# Patient Record
Sex: Female | Born: 1950 | Race: White | Hispanic: No | Smoking: Former smoker
Health system: Southern US, Community
[De-identification: ages and names within clinical notes are randomized; demographics above are authoritative.]

## PROBLEM LIST (undated history)

## (undated) DIAGNOSIS — K219 Gastro-esophageal reflux disease without esophagitis: Secondary | ICD-10-CM

## (undated) DIAGNOSIS — C50919 Malignant neoplasm of unspecified site of unspecified female breast: Secondary | ICD-10-CM

## (undated) DIAGNOSIS — T8859XA Other complications of anesthesia, initial encounter: Secondary | ICD-10-CM

## (undated) DIAGNOSIS — I499 Cardiac arrhythmia, unspecified: Secondary | ICD-10-CM

## (undated) DIAGNOSIS — M8580 Other specified disorders of bone density and structure, unspecified site: Secondary | ICD-10-CM

## (undated) DIAGNOSIS — I491 Atrial premature depolarization: Secondary | ICD-10-CM

## (undated) DIAGNOSIS — C801 Malignant (primary) neoplasm, unspecified: Secondary | ICD-10-CM

## (undated) DIAGNOSIS — Z923 Personal history of irradiation: Secondary | ICD-10-CM

## (undated) DIAGNOSIS — J45909 Unspecified asthma, uncomplicated: Secondary | ICD-10-CM

## (undated) DIAGNOSIS — M1990 Unspecified osteoarthritis, unspecified site: Secondary | ICD-10-CM

## (undated) DIAGNOSIS — Z8719 Personal history of other diseases of the digestive system: Secondary | ICD-10-CM

## (undated) DIAGNOSIS — T4145XA Adverse effect of unspecified anesthetic, initial encounter: Secondary | ICD-10-CM

## (undated) DIAGNOSIS — T7840XA Allergy, unspecified, initial encounter: Secondary | ICD-10-CM

## (undated) DIAGNOSIS — H269 Unspecified cataract: Secondary | ICD-10-CM

## (undated) HISTORY — PX: ABDOMINAL HYSTERECTOMY: SHX81

## (undated) HISTORY — DX: Other specified disorders of bone density and structure, unspecified site: M85.80

## (undated) HISTORY — DX: Malignant (primary) neoplasm, unspecified: C80.1

## (undated) HISTORY — DX: Allergy, unspecified, initial encounter: T78.40XA

## (undated) HISTORY — PX: CATARACT EXTRACTION: SUR2

## (undated) HISTORY — DX: Personal history of other diseases of the digestive system: Z87.19

## (undated) HISTORY — PX: BREAST LUMPECTOMY: SHX2

## (undated) HISTORY — DX: Unspecified cataract: H26.9

## (undated) HISTORY — DX: Cardiac arrhythmia, unspecified: I49.9

## (undated) HISTORY — PX: APPENDECTOMY: SHX54

## (undated) HISTORY — DX: Unspecified osteoarthritis, unspecified site: M19.90

## (undated) HISTORY — PX: BUNIONECTOMY: SHX129

## (undated) HISTORY — DX: Gastro-esophageal reflux disease without esophagitis: K21.9

---
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
## 1976-02-14 DIAGNOSIS — C801 Malignant (primary) neoplasm, unspecified: Secondary | ICD-10-CM

## 1976-02-14 HISTORY — DX: Malignant (primary) neoplasm, unspecified: C80.1

## 1986-02-13 HISTORY — PX: COLON SURGERY: SHX602

## 1997-08-20 ENCOUNTER — Ambulatory Visit (HOSPITAL_COMMUNITY): Admission: RE | Admit: 1997-08-20 | Discharge: 1997-08-20 | Payer: Self-pay | Admitting: Gastroenterology

## 1998-12-16 ENCOUNTER — Encounter (INDEPENDENT_AMBULATORY_CARE_PROVIDER_SITE_OTHER): Payer: Self-pay | Admitting: Specialist

## 1998-12-16 ENCOUNTER — Other Ambulatory Visit: Admission: RE | Admit: 1998-12-16 | Discharge: 1998-12-16 | Payer: Self-pay | Admitting: Gastroenterology

## 1999-06-16 ENCOUNTER — Encounter: Admission: RE | Admit: 1999-06-16 | Discharge: 1999-06-16 | Payer: Self-pay | Admitting: Gynecology

## 1999-06-16 ENCOUNTER — Encounter: Payer: Self-pay | Admitting: Gynecology

## 2000-05-07 ENCOUNTER — Other Ambulatory Visit: Admission: RE | Admit: 2000-05-07 | Discharge: 2000-05-07 | Payer: Self-pay | Admitting: Gynecology

## 2000-06-18 ENCOUNTER — Encounter: Admission: RE | Admit: 2000-06-18 | Discharge: 2000-06-18 | Payer: Self-pay | Admitting: Gynecology

## 2000-06-18 ENCOUNTER — Encounter: Payer: Self-pay | Admitting: Gynecology

## 2001-06-03 ENCOUNTER — Other Ambulatory Visit: Admission: RE | Admit: 2001-06-03 | Discharge: 2001-06-03 | Payer: Self-pay | Admitting: Gynecology

## 2001-06-20 ENCOUNTER — Encounter: Admission: RE | Admit: 2001-06-20 | Discharge: 2001-06-20 | Payer: Self-pay | Admitting: Gynecology

## 2001-06-20 ENCOUNTER — Encounter: Payer: Self-pay | Admitting: Gynecology

## 2002-06-10 ENCOUNTER — Other Ambulatory Visit: Admission: RE | Admit: 2002-06-10 | Discharge: 2002-06-10 | Payer: Self-pay | Admitting: Gynecology

## 2002-06-24 ENCOUNTER — Encounter: Admission: RE | Admit: 2002-06-24 | Discharge: 2002-06-24 | Payer: Self-pay | Admitting: Gynecology

## 2002-06-24 ENCOUNTER — Encounter: Payer: Self-pay | Admitting: Gynecology

## 2003-06-15 ENCOUNTER — Other Ambulatory Visit: Admission: RE | Admit: 2003-06-15 | Discharge: 2003-06-15 | Payer: Self-pay | Admitting: Gynecology

## 2003-07-02 ENCOUNTER — Encounter: Admission: RE | Admit: 2003-07-02 | Discharge: 2003-07-02 | Payer: Self-pay | Admitting: Gynecology

## 2004-06-15 ENCOUNTER — Other Ambulatory Visit: Admission: RE | Admit: 2004-06-15 | Discharge: 2004-06-15 | Payer: Self-pay | Admitting: Gynecology

## 2004-07-13 ENCOUNTER — Encounter: Admission: RE | Admit: 2004-07-13 | Discharge: 2004-07-13 | Payer: Self-pay | Admitting: Gynecology

## 2005-06-20 ENCOUNTER — Other Ambulatory Visit: Admission: RE | Admit: 2005-06-20 | Discharge: 2005-06-20 | Payer: Self-pay | Admitting: Gynecology

## 2005-07-25 ENCOUNTER — Encounter: Admission: RE | Admit: 2005-07-25 | Discharge: 2005-07-25 | Payer: Self-pay | Admitting: Gynecology

## 2006-06-26 ENCOUNTER — Other Ambulatory Visit: Admission: RE | Admit: 2006-06-26 | Discharge: 2006-06-26 | Payer: Self-pay | Admitting: Gynecology

## 2006-08-06 ENCOUNTER — Encounter: Admission: RE | Admit: 2006-08-06 | Discharge: 2006-08-06 | Payer: Self-pay | Admitting: Gynecology

## 2007-01-16 ENCOUNTER — Ambulatory Visit: Payer: Self-pay | Admitting: Internal Medicine

## 2007-02-01 ENCOUNTER — Ambulatory Visit: Payer: Self-pay | Admitting: Internal Medicine

## 2007-02-01 ENCOUNTER — Encounter: Payer: Self-pay | Admitting: Internal Medicine

## 2007-04-17 ENCOUNTER — Ambulatory Visit: Payer: Self-pay | Admitting: Internal Medicine

## 2007-04-17 LAB — CONVERTED CEMR LAB
ALT: 15 units/L (ref 0–35)
AST: 17 units/L (ref 0–37)
Albumin: 4.2 g/dL (ref 3.5–5.2)
Alkaline Phosphatase: 63 units/L (ref 39–117)
BUN: 10 mg/dL (ref 6–23)
Basophils Absolute: 0 10*3/uL (ref 0.0–0.1)
Basophils Relative: 0.4 % (ref 0.0–1.0)
Bilirubin, Direct: 0.1 mg/dL (ref 0.0–0.3)
CO2: 29 meq/L (ref 19–32)
Calcium: 9.5 mg/dL (ref 8.4–10.5)
Chloride: 103 meq/L (ref 96–112)
Creatinine, Ser: 0.8 mg/dL (ref 0.4–1.2)
Eosinophils Absolute: 0.1 10*3/uL (ref 0.0–0.6)
Eosinophils Relative: 1.8 % (ref 0.0–5.0)
GFR calc Af Amer: 96 mL/min
GFR calc non Af Amer: 79 mL/min
Glucose, Bld: 91 mg/dL (ref 70–99)
HCT: 38.5 % (ref 36.0–46.0)
Hemoglobin: 13 g/dL (ref 12.0–15.0)
Lymphocytes Relative: 26.5 % (ref 12.0–46.0)
MCHC: 33.9 g/dL (ref 30.0–36.0)
MCV: 88.8 fL (ref 78.0–100.0)
Monocytes Absolute: 0.3 10*3/uL (ref 0.2–0.7)
Monocytes Relative: 5.9 % (ref 3.0–11.0)
Neutro Abs: 3.9 10*3/uL (ref 1.4–7.7)
Neutrophils Relative %: 65.4 % (ref 43.0–77.0)
Platelets: 245 10*3/uL (ref 150–400)
Potassium: 4 meq/L (ref 3.5–5.1)
RBC: 4.33 M/uL (ref 3.87–5.11)
RDW: 12.1 % (ref 11.5–14.6)
Sodium: 140 meq/L (ref 135–145)
TSH: 1.84 microintl units/mL (ref 0.35–5.50)
Tissue Transglutaminase Ab, IgA: 0.2 units (ref <7)
Total Bilirubin: 0.5 mg/dL (ref 0.3–1.2)
Total Protein: 6.8 g/dL (ref 6.0–8.3)
WBC: 5.8 10*3/uL (ref 4.5–10.5)

## 2007-04-18 ENCOUNTER — Encounter: Payer: Self-pay | Admitting: Internal Medicine

## 2007-05-15 ENCOUNTER — Ambulatory Visit: Payer: Self-pay | Admitting: Internal Medicine

## 2007-08-07 ENCOUNTER — Encounter: Admission: RE | Admit: 2007-08-07 | Discharge: 2007-08-07 | Payer: Self-pay | Admitting: Gynecology

## 2008-08-07 ENCOUNTER — Encounter: Admission: RE | Admit: 2008-08-07 | Discharge: 2008-08-07 | Payer: Self-pay | Admitting: Gynecology

## 2009-07-26 ENCOUNTER — Encounter: Admission: RE | Admit: 2009-07-26 | Discharge: 2009-07-26 | Payer: Self-pay | Admitting: Family Medicine

## 2009-08-09 ENCOUNTER — Encounter: Admission: RE | Admit: 2009-08-09 | Discharge: 2009-08-09 | Payer: Self-pay | Admitting: Gynecology

## 2010-06-28 NOTE — Assessment & Plan Note (Signed)
[facility]                         GASTROENTEROLOGY OFFICE NOTE   Kara, Ramos                    MRN:          [id number]  DATE:05/15/2007                            DOB:          07/05/1950    HISTORY:  Kara Ramos presents today for followup.  She is a 60 year old  with a remote history of gynecologic cancer for which she underwent  radical hysterectomy in 1978.  She also has a history of endometrial  disease involving the colon, for which she has undergone segmental  resection of the sigmoid colon.  Finally, she has a history of chronic  abdominal complaints, consistent with irritable bowel syndrome.  She was  evaluated in the office April 17, 2007, for alternating bowel habits.  She was felt to have an exacerbation of irritable bowel, likely due to  an infectious agent.  We did test her stools for fat, as well as Giardia  and cryptosporidium.  These were negative.  Tissue transglutaminase  antibody was also negative.  She was empirically treated with  metronidazole and Align.  She presents today for followup.   The patient reports marked improvement in her abdominal complaints.  She  states her bowel habits have returned to their norm.  No other abdominal  issues.  She does tell me, however, that she has developed oral thrush,  which has persisted.  No odynophagia.   CURRENT MEDICATIONS INCLUDE:  Vivelle Dot, multivitamin, Citrucel,  vitamin D and Prilosec.   ALLERGIES:  She is allergic to ERYTHROMYCIN only.   PHYSICAL EXAM:  Reveals a well-appearing female, in no acute distress.  Blood pressure is 104/60, heart rate 68, weight is 147.2 pounds.  HEENT:  Oral mucosa indeed reveals thrush on the tongue, as manifested  by dense, white exudate.  ABDOMEN:  The abdomen was not re-examined.   IMPRESSION:  1. Irritable bowel syndrome.  2. Oral candidiasis, secondary to recent antibiotics.  3. History of reflux, on Prilosec.   RECOMMENDATIONS:  1. Diflucan 200 mg day #1, then 100 mg daily for five days to treat      thrush.  (She favored this over Mycelex Troches.)  2. Okay to use probiotic therapy on demand for irritable bowel.  3. Continue Prilosec for reflux.  4. Surveillance colonoscopy in five years.  5. Resume general medical care with Dr. Theresia Lo.     Kara Ramos. Marina Goodell, MD  Electronically Signed    JNP/MedQ  DD: 05/15/2007  DT: 05/15/2007  Job #: [id number]   cc:   Vikki Ports, M.D.  Gretta Cool, M.D.

## 2010-06-28 NOTE — Assessment & Plan Note (Signed)
[facility]                         GASTROENTEROLOGY OFFICE NOTE   RICARDO, KAYES                    MRN:          [id number]  DATE:04/17/2007                            DOB:          1950/12/12    REASON FOR CONSULTATION:  Alternating bowel habits.   HISTORY:  This is a 60 year old white female with a remote history of  gynecologic cancer for which she underwent radical hysterectomy in 1978.  She also had a history of endometrial disease involving the colon for  which she underwent segmental resection of the sigmoid region.  She had  not been seen in this office since October of 2000 when she was followed  by Dr. Victorino Dike.  She does have a history of adenomatous colon polyps  and a family history of colon cancer.  I saw the patient for  surveillance colonoscopy in December of 2008.  She was found to have  multiple diminutive polyps which were both hyperplastic and adenomatous.  Followup in 5 years recommended.  The patient reports to me that prior  to that procedure, around Thanksgiving time, she developed problems with  diarrhea.  Since that time, she describes intermittent problems with  left upper quadrant cramping pain that occurs before defecation.  There  is urgency and loose stools.  Problems tend to occur at night and last  several hours.  She will be okay for several weeks.  She has tried  Imodium a few times and states that this helps.  She also has a history  of indigestion and heartburn for which she has been on Prilosec.  This  has been helpful.  She does report more chronic alternating bowel  habits, consistent with irritable bowel syndrome.  Occasionally she will  notice some blood and mucous.  She also mentions the congestion in her  head after meals.  She can have constipation.  She will use an  occasional stool softener.  Her appetite and weight have been stable.  Overall, nonspecifically, she states her symptoms seem to  have improved  a little bit in time.   PAST MEDICAL HISTORY:  1. Asthma.  2. Gynecologic cancer, status post radical hysterectomy.  3. Endometriosis, status post sigmoid colon resection.  4. Reflux disease.  5. Irritable bowel.   ALLERGIES:  ERYTHROMYCIN causes nausea and itching.   CURRENT MEDICATIONS:  1. Vivelle-Dot.  2. Multivitamin.  3. Citrucel.  4. Vitamin D.  5. Prilosec.   FAMILY HISTORY:  Grandparents with colon cancer.   SOCIAL HISTORY:  The patient is married, lives with her husband, she  does not have children.  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  She quit smoking 30 years ago.  She does  use alcohol.   REVIEW OF SYSTEMS:  Per diagnostic evaluation form.   PHYSICAL EXAMINATION:  A well-appearing female in no acute distress.  She is alert and oriented.  Blood pressure is 116/62, heart rate is 78, weight is 147.2 pounds, she  is 5 feet 2-1/2 inches in height.  HEENT:  Sclerae anicteric.  Conjunctivae are pink.  Oral mucosa  is  intact, there is no adenopathy.  LUNGS:  Clear.  HEART:  Regular.  ABDOMEN:  Soft without tenderness, masses, or hernia.  Good bowel sounds  heard.  EXTREMITIES:  Without edema.   IMPRESSION:  This is a 60 year old female with a long history of  alternating bowel habits consistent with irritable bowel syndrome.  Complaints today include ongoing alternating bowel habits.  Mostly  concerned with problems with diarrhea and urgency beyond her usual  irritable bowel.  I suspect she may have had post infectious  exacerbation of her irritable bowel.  She has improved, some,  nonspecifically.  Other causes for symptoms should be sought.  It is  possible that she may have Giardia or even bacterial overgrowth.   RECOMMENDATIONS:  1. Expand the database to include routine laboratories as well as      tissue transglutaminase antibody and stool studies for fecal fat.  2. Empiric treatment with metronidazole  250 mg q.i.d. for 1 week and      probiotic Align once daily for 2 weeks.  3. Continue Prilosec for reflux.  4. Routine office followup in about 4 weeks.  In the interim she may      use Imodium p.r.n.     Wilhemina Bonito. Marina Goodell, MD  Electronically Signed    JNP/MedQ  DD: 04/19/2007  DT: 04/19/2007  Job #: [id number]   cc:   Vikki Ports, M.D.  Gretta Cool, M.D.

## 2010-07-04 ENCOUNTER — Other Ambulatory Visit: Payer: Self-pay | Admitting: Gynecology

## 2010-07-18 ENCOUNTER — Other Ambulatory Visit: Payer: Self-pay | Admitting: Gynecology

## 2010-07-18 DIAGNOSIS — Z1231 Encounter for screening mammogram for malignant neoplasm of breast: Secondary | ICD-10-CM

## 2010-08-12 ENCOUNTER — Ambulatory Visit
Admission: RE | Admit: 2010-08-12 | Discharge: 2010-08-12 | Disposition: A | Discharge status: Home / Self Care | Payer: BC Managed Care – PPO | Source: Ambulatory Visit | Attending: Gynecology | Admitting: Gynecology

## 2010-08-12 DIAGNOSIS — Z1231 Encounter for screening mammogram for malignant neoplasm of breast: Secondary | ICD-10-CM

## 2011-07-06 ENCOUNTER — Other Ambulatory Visit: Payer: Self-pay | Admitting: Gynecology

## 2011-07-17 ENCOUNTER — Other Ambulatory Visit: Payer: Self-pay | Admitting: Gynecology

## 2011-07-17 DIAGNOSIS — Z1231 Encounter for screening mammogram for malignant neoplasm of breast: Secondary | ICD-10-CM

## 2011-08-23 ENCOUNTER — Ambulatory Visit
Admission: RE | Admit: 2011-08-23 | Discharge: 2011-08-23 | Disposition: A | Discharge status: Home / Self Care | Payer: BC Managed Care – PPO | Source: Ambulatory Visit | Attending: Gynecology | Admitting: Gynecology

## 2011-08-23 DIAGNOSIS — Z1231 Encounter for screening mammogram for malignant neoplasm of breast: Secondary | ICD-10-CM

## 2011-08-24 ENCOUNTER — Other Ambulatory Visit: Payer: Self-pay | Admitting: Gynecology

## 2011-08-24 DIAGNOSIS — R928 Other abnormal and inconclusive findings on diagnostic imaging of breast: Secondary | ICD-10-CM

## 2011-08-31 ENCOUNTER — Ambulatory Visit
Admission: RE | Admit: 2011-08-31 | Discharge: 2011-08-31 | Disposition: A | Discharge status: Home / Self Care | Payer: BC Managed Care – PPO | Source: Ambulatory Visit | Attending: Gynecology | Admitting: Gynecology

## 2011-08-31 DIAGNOSIS — R928 Other abnormal and inconclusive findings on diagnostic imaging of breast: Secondary | ICD-10-CM

## 2012-01-04 ENCOUNTER — Encounter: Payer: Self-pay | Admitting: Internal Medicine

## 2012-01-10 ENCOUNTER — Encounter: Payer: Self-pay | Admitting: Internal Medicine

## 2012-01-17 ENCOUNTER — Other Ambulatory Visit: Payer: Self-pay | Admitting: Dermatology

## 2012-02-15 ENCOUNTER — Other Ambulatory Visit: Payer: Self-pay | Admitting: Dermatology

## 2012-02-16 ENCOUNTER — Ambulatory Visit (AMBULATORY_SURGERY_CENTER): Payer: BC Managed Care – PPO | Admitting: *Deleted

## 2012-02-16 VITALS — Ht 63.0 in | Wt 141.0 lb

## 2012-02-16 DIAGNOSIS — Z1211 Encounter for screening for malignant neoplasm of colon: Secondary | ICD-10-CM

## 2012-02-16 MED ORDER — PEG-KCL-NACL-NASULF-NA ASC-C 100 G PO SOLR: ORAL | Status: DC

## 2012-02-16 NOTE — Progress Notes (Signed)
No allergies to eggs or soy products  Pt states that with general anesthesia, she had two occurences where she "woke up when I wasn't supposed to."

## 2012-02-19 ENCOUNTER — Encounter: Payer: Self-pay | Admitting: Internal Medicine

## 2012-02-20 NOTE — Addendum Note (Signed)
Addended by: Maple Hudson on: 02/20/2012 12:46 PM   Modules accepted: Level of Service

## 2012-03-04 ENCOUNTER — Telehealth: Payer: Self-pay | Admitting: Internal Medicine

## 2012-03-04 NOTE — Telephone Encounter (Signed)
Pt states she saw the doctor yesterday and she has bronchitis. She is on Doxycycline and Robitussin with codeine, she does not have a fever. The doctor she saw yesterday told her she should be fine for the colon on Tuesday but she wanted to check with [place] and make sure. Dr. Marina Goodell please advise.

## 2012-03-04 NOTE — Telephone Encounter (Signed)
Prep is Tuesday, procedure Wednesday

## 2012-03-04 NOTE — Telephone Encounter (Signed)
Pt aware.

## 2012-03-04 NOTE — Telephone Encounter (Signed)
No problem. Keep appt.

## 2012-03-06 ENCOUNTER — Encounter: Payer: Self-pay | Admitting: Internal Medicine

## 2012-03-06 ENCOUNTER — Ambulatory Visit (AMBULATORY_SURGERY_CENTER): Payer: BC Managed Care – PPO | Admitting: Internal Medicine

## 2012-03-06 VITALS — BP 113/61 | HR 60 | Temp 97.3°F | Resp 13 | Ht 62.0 in | Wt 141.2 lb

## 2012-03-06 DIAGNOSIS — D126 Benign neoplasm of colon, unspecified: Secondary | ICD-10-CM

## 2012-03-06 DIAGNOSIS — Z86010 Personal history of colonic polyps: Secondary | ICD-10-CM

## 2012-03-06 DIAGNOSIS — Z1211 Encounter for screening for malignant neoplasm of colon: Secondary | ICD-10-CM

## 2012-03-06 MED ORDER — SODIUM CHLORIDE 0.9 % IV SOLN: 500.0000 mL | INTRAVENOUS | Status: DC

## 2012-03-06 NOTE — Progress Notes (Signed)
VSS. A&O. Pleased with MAC. Report to April RN, DRM

## 2012-03-06 NOTE — Progress Notes (Signed)
Patient did not experience any of the following events: a burn prior to discharge; a fall within the facility; wrong site/side/patient/procedure/implant event; or a hospital transfer or hospital admission upon discharge from the facility. (G8907) Patient did not have preoperative order for IV antibiotic SSI prophylaxis. (G8918)  

## 2012-03-06 NOTE — Patient Instructions (Addendum)
YOU HAD AN ENDOSCOPIC PROCEDURE TODAY AT THE  ENDOSCOPY CENTER: Refer to the procedure report that was given to you for any specific questions about what was found during the examination.  If the procedure report does not answer your questions, please call your gastroenterologist to clarify.  If you requested that your care partner not be given the details of your procedure findings, then the procedure report has been included in a sealed envelope for you to review at your convenience later.  YOU SHOULD EXPECT: Some feelings of bloating in the abdomen. Passage of more gas than usual.  Walking can help get rid of the air that was put into your GI tract during the procedure and reduce the bloating. If you had a lower endoscopy (such as a colonoscopy or flexible sigmoidoscopy) you may notice spotting of blood in your stool or on the toilet paper. If you underwent a bowel prep for your procedure, then you may not have a normal bowel movement for a few days.  DIET: Your first meal following the procedure should be a light meal and then it is ok to progress to your normal diet.  A half-sandwich or bowl of soup is an example of a good first meal.  Heavy or fried foods are harder to digest and may make you feel nauseous or bloated.  Likewise meals heavy in dairy and vegetables can cause extra gas to form and this can also increase the bloating.  Drink plenty of fluids but you should avoid alcoholic beverages for 24 hours.  ACTIVITY: Your care partner should take you home directly after the procedure.  You should plan to take it easy, moving slowly for the rest of the day.  You can resume normal activity the day after the procedure however you should NOT DRIVE or use heavy machinery for 24 hours (because of the sedation medicines used during the test).    SYMPTOMS TO REPORT IMMEDIATELY: A gastroenterologist can be reached at any hour.  During normal business hours, 8:30 AM to 5:00 PM Monday through Friday,  call [phone].  After hours and on weekends, please call the GI answering service at [phone] who will take a message and have the physician on call contact you.   Following lower endoscopy (colonoscopy or flexible sigmoidoscopy):  Excessive amounts of blood in the stool  Significant tenderness or worsening of abdominal pains  Swelling of the abdomen that is new, acute  Fever of 100F or higher   FOLLOW UP: If any biopsies were taken you will be contacted by phone or by letter within the next 1-3 weeks.  Call your gastroenterologist if you have not heard about the biopsies in 3 weeks.  Our staff will call the home number listed on your records the next business day following your procedure to check on you and address any questions or concerns that you may have at that time regarding the information given to you following your procedure. This is a courtesy call and so if there is no answer at the home number and we have not heard from you through the emergency physician on call, we will assume that you have returned to your regular daily activities without incident.  SIGNATURES/CONFIDENTIALITY: You and/or your care partner have signed paperwork which will be entered into your electronic medical record.  These signatures attest to the fact that that the information above on your After Visit Summary has been reviewed and is understood.  Full responsibility of the confidentiality of   this discharge information lies with you and/or your care-partner.  Polyps-handout given  Repeat colonoscopy in 5 years.  

## 2012-03-06 NOTE — Progress Notes (Signed)
Called to room to assist during endoscopic procedure.  Patient ID and intended procedure confirmed with present staff. Received instructions for my participation in the procedure from the performing physician.  

## 2012-03-06 NOTE — Op Note (Signed)
[facility] [address] [place], [address]   COLONOSCOPY PROCEDURE REPORT  PATIENT: Kara, Ramos  MR#: [id number] BIRTHDATE: November 18, 1950 , 61  yrs. old GENDER: Female ENDOSCOPIST: Roxy Cedar, MD REFERRED PI:[id number] Program Recall PROCEDURE DATE:  03/06/2012 PROCEDURE:   Colonoscopy with snare polypectomy x 4 ASA CLASS:   Class II INDICATIONS:Patient's personal history of adenomatous colon polyps. Last exam 01-2007. MEDICATIONS: MAC sedation, administered by CRNA and propofol (Diprivan) 350mg  IV  DESCRIPTION OF PROCEDURE:   After the risks benefits and alternatives of the procedure were thoroughly explained, informed consent was obtained.  A digital rectal exam revealed no abnormalities of the rectum.   The LB CF-[id number]  endoscope was introduced through the anus and advanced to the cecum, which was identified by both the appendix and ileocecal valve.ICV IMAGED BUT NOT CAPTURED. No adverse events experienced.   The quality of the prep was excellent, using MoviPrep  The instrument was then slowly withdrawn as the colon was fully examined.      COLON FINDINGS: Four polyps measuring < 6 mm in size were found at the cecum and in the ascending colon.  A polypectomy was performed with a cold snare.  The resection was complete and the polyp tissue was completely retrieved.   There was evidence of a prior colo-colonic surgical anastomosis in the sigmoid colon.   The colon mucosa was otherwise normal.  Retroflexed views revealed no abnormalities. The time to cecum=2 minutes 37 seconds.  Withdrawal time=11 minutes 19 seconds.  The scope was withdrawn and the procedure completed. COMPLICATIONS: There were no complications.  ENDOSCOPIC IMPRESSION: 1.   Four polyps measuring < 6 mm in size were found at the cecum and in the ascending colon; polypectomy was performed with a cold snare 2.   There was evidence of a prior colo-colonic surgical  anastomosis in the sigmoid colon 3.   The colon mucosa was otherwise normal  RECOMMENDATIONS: 1. Follow up colonoscopy in 5 years   eSigned:  Roxy Cedar, MD 03/06/2012 10:56 AM  cc: The Patient    ; Gretel Acre, MD [facility])   PATIENT NAME:  Kara, Ramos MR#: [id number]

## 2012-03-07 ENCOUNTER — Telehealth: Payer: Self-pay | Admitting: *Deleted

## 2012-03-07 NOTE — Telephone Encounter (Signed)
  Follow up Call-  Call back number 03/06/2012  Post procedure Call Back phone  # [phone]  Permission to leave phone message Yes     Patient questions:  Do you have a fever, pain , or abdominal swelling? no Pain Score  0 *  Have you tolerated food without any problems? yes  Have you been able to return to your normal activities? yes  Do you have any questions about your discharge instructions: Diet   no Medications  no Follow up visit  no  Do you have questions or concerns about your Care? no  Actions: * If pain score is 4 or above: No action needed, pain <4.

## 2012-03-11 ENCOUNTER — Encounter: Payer: Self-pay | Admitting: Internal Medicine

## 2012-07-24 ENCOUNTER — Other Ambulatory Visit: Payer: Self-pay

## 2012-07-24 ENCOUNTER — Other Ambulatory Visit: Payer: Self-pay | Admitting: Gynecology

## 2012-07-24 DIAGNOSIS — M8580 Other specified disorders of bone density and structure, unspecified site: Secondary | ICD-10-CM

## 2012-09-04 ENCOUNTER — Ambulatory Visit
Admission: RE | Admit: 2012-09-04 | Discharge: 2012-09-04 | Disposition: A | Discharge status: Home / Self Care | Payer: BC Managed Care – PPO | Source: Ambulatory Visit

## 2012-09-04 ENCOUNTER — Ambulatory Visit
Admission: RE | Admit: 2012-09-04 | Discharge: 2012-09-04 | Disposition: A | Discharge status: Home / Self Care | Payer: BC Managed Care – PPO | Source: Ambulatory Visit | Attending: Gynecology | Admitting: Gynecology

## 2012-09-04 DIAGNOSIS — M8580 Other specified disorders of bone density and structure, unspecified site: Secondary | ICD-10-CM

## 2012-09-06 ENCOUNTER — Other Ambulatory Visit: Payer: Self-pay | Admitting: Gynecology

## 2012-09-06 DIAGNOSIS — R928 Other abnormal and inconclusive findings on diagnostic imaging of breast: Secondary | ICD-10-CM

## 2012-09-25 ENCOUNTER — Ambulatory Visit
Admission: RE | Admit: 2012-09-25 | Discharge: 2012-09-25 | Disposition: A | Discharge status: Home / Self Care | Payer: BC Managed Care – PPO | Source: Ambulatory Visit | Attending: Gynecology | Admitting: Gynecology

## 2012-09-25 DIAGNOSIS — R928 Other abnormal and inconclusive findings on diagnostic imaging of breast: Secondary | ICD-10-CM

## 2013-08-21 ENCOUNTER — Other Ambulatory Visit: Payer: Self-pay

## 2013-08-21 DIAGNOSIS — Z1231 Encounter for screening mammogram for malignant neoplasm of breast: Secondary | ICD-10-CM

## 2013-09-05 ENCOUNTER — Encounter (INDEPENDENT_AMBULATORY_CARE_PROVIDER_SITE_OTHER): Payer: Self-pay

## 2013-09-05 ENCOUNTER — Ambulatory Visit
Admission: RE | Admit: 2013-09-05 | Discharge: 2013-09-05 | Disposition: A | Discharge status: Home / Self Care | Payer: BC Managed Care – PPO | Source: Ambulatory Visit

## 2013-09-05 DIAGNOSIS — Z1231 Encounter for screening mammogram for malignant neoplasm of breast: Secondary | ICD-10-CM

## 2014-07-31 ENCOUNTER — Other Ambulatory Visit: Payer: Self-pay

## 2014-07-31 DIAGNOSIS — Z1231 Encounter for screening mammogram for malignant neoplasm of breast: Secondary | ICD-10-CM

## 2014-08-21 ENCOUNTER — Other Ambulatory Visit: Payer: Self-pay | Admitting: Obstetrics and Gynecology

## 2014-08-21 DIAGNOSIS — R5381 Other malaise: Secondary | ICD-10-CM

## 2014-08-25 ENCOUNTER — Other Ambulatory Visit: Payer: Self-pay | Admitting: Obstetrics and Gynecology

## 2014-08-25 DIAGNOSIS — E2839 Other primary ovarian failure: Secondary | ICD-10-CM

## 2014-09-09 ENCOUNTER — Ambulatory Visit
Admission: RE | Admit: 2014-09-09 | Discharge: 2014-09-09 | Disposition: A | Discharge status: Home / Self Care | Payer: BLUE CROSS/BLUE SHIELD | Source: Ambulatory Visit | Attending: Obstetrics and Gynecology | Admitting: Obstetrics and Gynecology

## 2014-09-09 ENCOUNTER — Ambulatory Visit
Admission: RE | Admit: 2014-09-09 | Discharge: 2014-09-09 | Disposition: A | Discharge status: Home / Self Care | Payer: BLUE CROSS/BLUE SHIELD | Source: Ambulatory Visit

## 2014-09-09 DIAGNOSIS — Z1231 Encounter for screening mammogram for malignant neoplasm of breast: Secondary | ICD-10-CM

## 2014-09-09 DIAGNOSIS — E2839 Other primary ovarian failure: Secondary | ICD-10-CM

## 2015-02-09 ENCOUNTER — Other Ambulatory Visit: Payer: Self-pay | Admitting: Family Medicine

## 2015-02-09 DIAGNOSIS — R1011 Right upper quadrant pain: Secondary | ICD-10-CM

## 2015-02-10 ENCOUNTER — Ambulatory Visit
Admission: RE | Admit: 2015-02-10 | Discharge: 2015-02-10 | Disposition: A | Discharge status: Home / Self Care | Payer: BLUE CROSS/BLUE SHIELD | Source: Ambulatory Visit | Attending: Family Medicine | Admitting: Family Medicine

## 2015-02-10 DIAGNOSIS — R1011 Right upper quadrant pain: Secondary | ICD-10-CM

## 2015-08-19 ENCOUNTER — Other Ambulatory Visit: Payer: Self-pay | Admitting: Obstetrics and Gynecology

## 2015-08-19 DIAGNOSIS — Z1231 Encounter for screening mammogram for malignant neoplasm of breast: Secondary | ICD-10-CM

## 2015-09-13 ENCOUNTER — Other Ambulatory Visit: Payer: Self-pay | Admitting: Obstetrics and Gynecology

## 2015-09-13 ENCOUNTER — Ambulatory Visit
Admission: RE | Admit: 2015-09-13 | Discharge: 2015-09-13 | Disposition: A | Discharge status: Home / Self Care | Payer: BLUE CROSS/BLUE SHIELD | Source: Ambulatory Visit | Attending: Obstetrics and Gynecology | Admitting: Obstetrics and Gynecology

## 2015-09-13 DIAGNOSIS — N63 Unspecified lump in unspecified breast: Secondary | ICD-10-CM

## 2015-09-13 DIAGNOSIS — Z1231 Encounter for screening mammogram for malignant neoplasm of breast: Secondary | ICD-10-CM

## 2015-09-16 ENCOUNTER — Ambulatory Visit
Admission: RE | Admit: 2015-09-16 | Discharge: 2015-09-16 | Disposition: A | Discharge status: Home / Self Care | Payer: BLUE CROSS/BLUE SHIELD | Source: Ambulatory Visit | Attending: Obstetrics and Gynecology | Admitting: Obstetrics and Gynecology

## 2015-09-16 DIAGNOSIS — N63 Unspecified lump in unspecified breast: Secondary | ICD-10-CM

## 2016-07-20 ENCOUNTER — Ambulatory Visit (INDEPENDENT_AMBULATORY_CARE_PROVIDER_SITE_OTHER): Payer: BLUE CROSS/BLUE SHIELD | Admitting: Podiatry

## 2016-07-20 ENCOUNTER — Encounter: Payer: Self-pay | Admitting: Podiatry

## 2016-07-20 ENCOUNTER — Ambulatory Visit (INDEPENDENT_AMBULATORY_CARE_PROVIDER_SITE_OTHER): Payer: BLUE CROSS/BLUE SHIELD

## 2016-07-20 ENCOUNTER — Ambulatory Visit: Payer: BLUE CROSS/BLUE SHIELD

## 2016-07-20 VITALS — Resp 16 | Ht 62.5 in | Wt 160.0 lb

## 2016-07-20 DIAGNOSIS — M779 Enthesopathy, unspecified: Secondary | ICD-10-CM

## 2016-07-20 DIAGNOSIS — M21621 Bunionette of right foot: Secondary | ICD-10-CM | POA: Diagnosis not present

## 2016-07-20 DIAGNOSIS — M2041 Other hammer toe(s) (acquired), right foot: Secondary | ICD-10-CM | POA: Diagnosis not present

## 2016-07-20 DIAGNOSIS — M79672 Pain in left foot: Secondary | ICD-10-CM

## 2016-07-20 MED ORDER — TRIAMCINOLONE ACETONIDE 10 MG/ML IJ SUSP
10.0000 mg | Freq: Once | INTRAMUSCULAR | Status: AC
  Administered 2016-07-20: 10 mg

## 2016-07-20 NOTE — Progress Notes (Signed)
Subjective:    Patient ID: Ernestina Penna, female   DOB: 66 y.o.   MRN: [id number]   HPI patient states that she's getting a lot of pain in the outside of the right foot that's been present for several years and the second toe right is starting to lift in the air and also the left foot has had some pain in the forefoot more nondescript in nature    Review of Systems  All other systems reviewed and are negative.       Objective:  Physical Exam  Cardiovascular: Intact distal pulses.   Musculoskeletal: Normal range of motion.  Neurological: She is alert.  Skin: Skin is warm.  Nursing note and vitals reviewed.  neurovascular status intact muscle strength adequate range of motion within normal limits with prominence of the fifth metatarsal right head with redness and fluid buildup and elevation of the second digit right foot. 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 I then recommended wider shoes for the digit and did not see anything needs to be done for the left foot  X-rays indicate there is a small spur on the tailor's bunion deformity right and there is a well-healed surgical sites first metatarsal bilateral with pin in place left first metatarsal

## 2016-07-20 NOTE — Progress Notes (Signed)
   Subjective:    Patient ID: Kara Ramos, female    DOB: June 07, 1950, 66 y.o.   MRN: [id number]  HPI Chief Complaint  Patient presents with  . Foot Pain    Bilateral; Right foot-lateral side-below 5th toe; pt stated, "this has been going on for years"  . Toe Pain    Right foot; 2nd toe & 4th toe; pt stated, "2nd toe rubs with certain shoes; 4th toe is going into the 3rd toe"      Review of Systems  All other systems reviewed and are negative.      Objective:   Physical Exam        Assessment & Plan:

## 2016-10-09 ENCOUNTER — Other Ambulatory Visit: Payer: Self-pay | Admitting: Obstetrics and Gynecology

## 2016-10-09 DIAGNOSIS — N63 Unspecified lump in unspecified breast: Secondary | ICD-10-CM

## 2016-10-11 ENCOUNTER — Ambulatory Visit
Admission: RE | Admit: 2016-10-11 | Discharge: 2016-10-11 | Disposition: A | Discharge status: Home / Self Care | Payer: BLUE CROSS/BLUE SHIELD | Source: Ambulatory Visit | Attending: Obstetrics and Gynecology | Admitting: Obstetrics and Gynecology

## 2016-10-11 DIAGNOSIS — N63 Unspecified lump in unspecified breast: Secondary | ICD-10-CM

## 2016-10-13 ENCOUNTER — Encounter: Payer: Self-pay | Admitting: Podiatry

## 2016-10-13 ENCOUNTER — Ambulatory Visit (INDEPENDENT_AMBULATORY_CARE_PROVIDER_SITE_OTHER): Payer: BLUE CROSS/BLUE SHIELD | Admitting: Podiatry

## 2016-10-13 DIAGNOSIS — M779 Enthesopathy, unspecified: Secondary | ICD-10-CM

## 2016-10-13 DIAGNOSIS — M21621 Bunionette of right foot: Secondary | ICD-10-CM | POA: Diagnosis not present

## 2016-10-13 MED ORDER — TRIAMCINOLONE ACETONIDE 10 MG/ML IJ SUSP
10.0000 mg | Freq: Once | INTRAMUSCULAR | Status: AC
  Administered 2016-10-13: 10 mg

## 2016-10-13 NOTE — Progress Notes (Signed)
Subjective:    Patient ID: Kara Ramos, female   DOB: 66 y.o.   MRN: [id number]   HPI patient states my right foot seems quite a bit improved but I'm having a lot of pain in the forefoot on my left foot.    ROS      Objective:  Physical Exam neurovascular status intact with patient found have improvement around the fifth MPJ right with chronic Taylor's bunion deformity and what appears to be inflammatory capsulitis second MPJ left foot     Assessment:    2 separate problems one being Taylor's bunion right and the other being inflammatory capsulitis left     Plan:   H&P conditions reviewed and for the right I recommended wider shoes padding and for the left I went ahead today and did a forefoot block I then aspirated the joint getting out a small amount of fluid which was bloody indicated there may be in a tear of the flexor plate and I then injected quarter cc deck some some Kenalog and applied thick plantar pad and discussed someday this may require bone repositioning. Reappoint to recheck

## 2016-10-20 ENCOUNTER — Ambulatory Visit: Payer: BLUE CROSS/BLUE SHIELD | Admitting: Podiatry

## 2016-10-25 ENCOUNTER — Encounter: Payer: Self-pay | Admitting: Podiatry

## 2016-10-25 ENCOUNTER — Ambulatory Visit (INDEPENDENT_AMBULATORY_CARE_PROVIDER_SITE_OTHER): Payer: BLUE CROSS/BLUE SHIELD | Admitting: Podiatry

## 2016-10-25 DIAGNOSIS — M779 Enthesopathy, unspecified: Secondary | ICD-10-CM | POA: Diagnosis not present

## 2016-10-25 DIAGNOSIS — M21621 Bunionette of right foot: Secondary | ICD-10-CM

## 2016-10-25 NOTE — Progress Notes (Signed)
Subjective:    Patient ID: Kara Ramos, female   DOB: 66 y.o.   MRN: [id number]   HPI patient states she still having quite a bit of discomfort in her second metatarsophalangeal joint left over right foot. States the medication help but still sore    ROS      Objective:  Physical Exam neurovascular status intact with inflammatory changes around the second MPJ left over right with chronic inflammation of the joint surface     Assessment:    Capsulitis second MPJ left over right     Plan:   Reviewed shoe gear modifications and that injections can be done on occasional basis. At this time I did make appointment with pad or if list for orthotic scanning with offloading of the second MPJ bilateral and thick material to try to reduce the pressure against the MPJs

## 2016-10-26 ENCOUNTER — Ambulatory Visit (INDEPENDENT_AMBULATORY_CARE_PROVIDER_SITE_OTHER): Payer: BLUE CROSS/BLUE SHIELD | Admitting: Orthotics

## 2016-10-26 DIAGNOSIS — M779 Enthesopathy, unspecified: Secondary | ICD-10-CM | POA: Diagnosis not present

## 2016-10-26 DIAGNOSIS — M21621 Bunionette of right foot: Secondary | ICD-10-CM

## 2016-10-26 NOTE — Progress Notes (Signed)
Patient presents today for casting/eval for CMFO per Dr Paulla Dolly.  Patient has hx of discomfort/pain 2nd MPJ bilateral.   Goal for orthotics are to offload 2nd MPJ and added forefoot cushion...  Plan on Richy to [facility]

## 2016-11-16 ENCOUNTER — Ambulatory Visit: Payer: BLUE CROSS/BLUE SHIELD | Admitting: Orthotics

## 2016-11-16 DIAGNOSIS — M2041 Other hammer toe(s) (acquired), right foot: Secondary | ICD-10-CM

## 2016-11-16 DIAGNOSIS — M779 Enthesopathy, unspecified: Secondary | ICD-10-CM

## 2016-11-16 NOTE — Progress Notes (Signed)
Patient came in today to pick up custom made foot orthotics.  The goals were accomplished and the patient reported no dissatisfaction with said orthotics.  Patient was advised of breakin period and how to report any issues. 

## 2017-01-03 ENCOUNTER — Ambulatory Visit: Payer: BLUE CROSS/BLUE SHIELD | Admitting: Podiatry

## 2017-01-03 ENCOUNTER — Ambulatory Visit (INDEPENDENT_AMBULATORY_CARE_PROVIDER_SITE_OTHER): Payer: BLUE CROSS/BLUE SHIELD

## 2017-01-03 ENCOUNTER — Encounter: Payer: Self-pay | Admitting: Podiatry

## 2017-01-03 DIAGNOSIS — M2010 Hallux valgus (acquired), unspecified foot: Secondary | ICD-10-CM

## 2017-01-03 DIAGNOSIS — M2041 Other hammer toe(s) (acquired), right foot: Secondary | ICD-10-CM | POA: Diagnosis not present

## 2017-01-03 DIAGNOSIS — M779 Enthesopathy, unspecified: Secondary | ICD-10-CM | POA: Diagnosis not present

## 2017-01-03 MED ORDER — TRIAMCINOLONE ACETONIDE 10 MG/ML IJ SUSP
10.0000 mg | Freq: Once | INTRAMUSCULAR | Status: AC
  Administered 2017-01-03: 10 mg

## 2017-01-03 NOTE — Progress Notes (Signed)
Subjective:    Patient ID: Kara Ramos, female   DOB: 66 y.o.   MRN: [id number]   HPI patient presents stating I have to get my right foot fixed and it's been very sore and making it hard for me to walk. Patient points to the outside of the right foot and the second joint and the second toe    ROS      Objective:  Physical Exam neurovascular status intact negative Homans sign was noted with patient's right second digit being elevated discomfort in the right second MPJ and prominence with pain around the fifth metatarsal head right with redness noted upon palpation     Assessment:   Inflammatory capsulitis Taylor's bunion deformity and hammertoe deformity second right      Plan:     Reviewed condition and x-rays with patient. At this point I have recommended correction and discussed digital fusion along with shortening osteotomy and fifth metatarsal osteotomy. I went over this with patient at great length and discussed procedures and what would be required and patient wants surgery understanding risk. She understands that recovery can take 6 months and there is no long-term guarantees and also the second toe may not purchase the ground. Patient signed consent form after extensive review and is scheduled for outpatient surgery and air fracture walker was dispensed with instructions on usage. Patient is encouraged to call with any questions that may come up

## 2017-01-03 NOTE — Patient Instructions (Addendum)
Bunion A bunion is a bump on the base of the big toe that forms when the bones of the big toe joint move out of position. Bunions may be small at first, but they often get larger over time. The can make walking painful. What are the causes? A bunion may be caused by:  Wearing narrow or pointed shoes that force the big toe to press against the other toes.  Abnormal foot development that causes the foot to roll inward (pronate).  Changes in the foot that are caused by certain diseases, such as rheumatoid arthritis and polio.  A foot injury.  What increases the risk? The following factors may make you more likely to develop this condition:  Wearing shoes that squeeze the toes together.  Having certain diseases, such as: ? Rheumatoid arthritis. ? Polio. ? Cerebral palsy.  Having family members who have bunions.  Being born with a foot deformity, such as flat feet or low arches.  Doing activities that put a lot of pressure on the feet, such as ballet dancing.  What are the signs or symptoms? The main symptom of a bunion is a noticeable bump on the big toe. Other symptoms may include:  Pain.  Swelling around the big toe.  Redness and inflammation.  Thick or hardened skin on the big toe or between the toes.  Stiffness or loss of motion in the big toe.  Trouble with walking.  How is this diagnosed? A bunion may be diagnosed based on your symptoms, medical history, and activities. You may have tests, such as:  X-rays. These allow your health care provider to check the position of the bones in your foot and look for damage to your joint. They also help your health care provider to determine the severity of your bunion and the best way to treat it.  Joint aspiration. In this test, a sample of fluid is removed from the toe joint. This test, which may be done if you are in a lot of pain, helps to rule out diseases that cause painful swelling of the joints, such as  arthritis.  How is this treated? There is no cure for a bunion, but treatment can help to prevent a bunion from getting worse. Treatment depends on the severity of your symptoms. Your health care provider may recommend:  Wearing shoes that have a wide toe box.  Using bunion pads to cushion the affected area.  Taping your toes together to keep them in a normal position.  Placing a device inside your shoe (orthotics) to help reduce pressure on your toe joint.  Taking medicine to ease pain, inflammation, and swelling.  Applying heat or ice to the affected area.  Doing stretching exercises.  Surgery to remove scar tissue and move the toes back into their normal position. This treatment is rare.  Follow these instructions at home:  Support your toe joint with proper footwear, shoe padding, or taping as told by your health care provider.  Take over-the-counter and prescription medicines only as told by your health care provider.  If directed, apply ice to the injured area: ? Put ice in a plastic bag. ? Place a towel between your skin and the bag. ? Leave the ice on for 20 minutes, 2-3 times per day.  If directed, apply heat to the affected area before you exercise. Use the heat source that your health care provider recommends, such as a moist heat pack or a heating pad. ? Place a towel between your   skin and the heat source. ? Leave the heat on for 20-30 minutes. ? Remove the heat if your skin turns bright red. This is especially important if you are unable to feel pain, heat, or cold. You may have a greater risk of getting burned.  Do exercises as told by your health care provider.  Keep all follow-up visits as told by your health care provider. Contact a health care provider if:  Your symptoms get worse.  Your symptoms do not improve in 2 weeks. Get help right away if:  You have severe pain and trouble with walking. This information is not intended to replace advice given  to you by your health care provider. Make sure you discuss any questions you have with your health care provider. Document Released: 01/30/2005 Document Revised: 07/08/2015 Document Reviewed: 08/30/2014 Elsevier Interactive Patient Education  2018 [employer]  Pre-Operative Instructions  Congratulations, you have decided to take an important step towards improving your quality of life.  You can be assured that the doctors and staff at [facility] will be with you every step of the way.  Here are some important things you should know:  1. Plan to be at the surgery center/hospital at least 1 (one) hour prior to your scheduled time, unless otherwise directed by the surgical center/hospital staff.  You must have a responsible adult accompany you, remain during the surgery and drive you home.  Make sure you have directions to the surgical center/hospital to ensure you arrive on time. 2. If you are having surgery at [facility] or [facility], you will need a copy of your medical history and physical form from your family physician within one month prior to the date of surgery. We will give you a form for your primary physician to complete.  3. We make every effort to accommodate the date you request for surgery.  However, there are times where surgery dates or times have to be moved.  We will contact you as soon as possible if a change in schedule is required.   4. No aspirin/ibuprofen for one week before surgery.  If you are on aspirin, any non-steroidal anti-inflammatory medications (Mobic, Aleve, Ibuprofen) should not be taken seven (7) days prior to your surgery.  You make take Tylenol for pain prior to surgery.  5. Medications - If you are taking daily heart and blood pressure medications, seizure, reflux, allergy, asthma, anxiety, pain or diabetes medications, make sure you notify the surgery center/hospital before the day of surgery so they can tell you which medications you should  take or avoid the day of surgery. 6. No food or drink after midnight the night before surgery unless directed otherwise by surgical center/hospital staff. 7. No alcoholic beverages 24-hours prior to surgery.  No smoking 24-hours prior or 24-hours after surgery. 8. Wear loose pants or shorts. They should be loose enough to fit over bandages, boots, and casts. 9. Don't wear slip-on shoes. Sneakers are preferred. 10. Bring your boot with you to the surgery center/hospital.  Also bring crutches or a walker if your physician has prescribed it for you.  If you do not have this equipment, it will be provided for you after surgery. 11. If you have not been contacted by the surgery center/hospital by the day before your surgery, call to confirm the date and time of your surgery. 12. Leave-time from work may vary depending on the type of surgery you have.  Appropriate arrangements should be made prior   to surgery with your employer. 13. Prescriptions will be provided immediately following surgery by your doctor.  Fill these as soon as possible after surgery and take the medication as directed. Pain medications will not be refilled on weekends and must be approved by the doctor. 14. Remove nail polish on the operative foot and avoid getting pedicures prior to surgery. 15. Wash the night before surgery.  The night before surgery wash the foot and leg well with water and the antibacterial soap provided. Be sure to pay special attention to beneath the toenails and in between the toes.  Wash for at least three (3) minutes. Rinse thoroughly with water and dry well with a towel.  Perform this wash unless told not to do so by your physician.  Enclosed: 1 Ice pack (please put in freezer the night before surgery)   1 Hibiclens skin cleaner   Pre-op instructions  If you have any questions regarding the instructions, please do not hesitate to call our office.  [place]: [address], [place], [place] [address] --  [phone]  [place]: [address] [place], [place] [address] -- [phone]  [place]: [address]  [place], [place] [address] -- [phone]  [place]: [address], [address], [place], [place] [address] -- [phone]  Website: [email or web address]  

## 2017-01-15 ENCOUNTER — Telehealth: Payer: Self-pay | Admitting: Podiatry

## 2017-01-15 ENCOUNTER — Telehealth: Payer: Self-pay

## 2017-01-15 NOTE — Telephone Encounter (Signed)
I'm scheduled to have a tailors bunionectomy and a pin put into another toe on 27 February 2017. This will be on my driving foot so I was wondering how long I will not be able to drive? I just want to know how long to make arrangements for other people to drive me. I also have some other questions. You can call me back at [phone] and if I don't answer you can leave a message as that is my cell phone. Thank you.

## 2017-01-15 NOTE — Telephone Encounter (Signed)
Spoke with patient informing her of normal recovery times and drive times for her particular surgery.

## 2017-01-26 NOTE — Telephone Encounter (Signed)
I am returning your call.  "I called about a week ago and someone has already returned my call."

## 2017-02-20 ENCOUNTER — Telehealth: Payer: Self-pay | Admitting: *Deleted

## 2017-02-20 NOTE — Telephone Encounter (Signed)
"  I'm scheduled to have some foot surgery next week on January 15.  I have an upper respiratory infection.  I'm going to need to postpone.  If you would call me, I'm going to have to reschedule to a later date.  Let me know if I need to call the surgical center or do you folk take care of that?"

## 2017-02-21 NOTE — Telephone Encounter (Signed)
I'm returning your call.  How can I help you?  "I have an upper respiratory infection.  So, I'm not going to be able to have my surgery next Tuesday.  I need to reschedule my surgery."  He can do it on January 29.  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  "That date will be fine, schedule it for then."  I left Caren Griffins, scheduler at [facility], a message to reschedule this patient's surgery from 02/27/2017 to 03/27/2017.

## 2017-03-16 HISTORY — PX: CORRECTION HAMMER TOE: SUR317

## 2017-03-27 ENCOUNTER — Encounter: Payer: Self-pay | Admitting: Podiatry

## 2017-03-27 DIAGNOSIS — M21541 Acquired clubfoot, right foot: Secondary | ICD-10-CM | POA: Diagnosis not present

## 2017-03-27 DIAGNOSIS — M2040 Other hammer toe(s) (acquired), unspecified foot: Secondary | ICD-10-CM | POA: Diagnosis not present

## 2017-03-29 ENCOUNTER — Telehealth: Payer: Self-pay | Admitting: *Deleted

## 2017-03-29 NOTE — Telephone Encounter (Signed)
Called and spoke with the patient and the patient stated that she was doing pretty good and had no real issues and was elevating and no fever,chills,nasuea and the pain medicine has made her itch some and I stated that if she wanted to stop taken the pain medicine and could use benadryl and call the office if need be and patient did state that she took the ace wrap off and there was not much swelling and I stated to call the office if any concerns or questions. Kara Ramos

## 2017-04-04 ENCOUNTER — Encounter: Payer: Self-pay | Admitting: Podiatry

## 2017-04-04 ENCOUNTER — Ambulatory Visit (INDEPENDENT_AMBULATORY_CARE_PROVIDER_SITE_OTHER): Payer: BLUE CROSS/BLUE SHIELD

## 2017-04-04 ENCOUNTER — Ambulatory Visit (INDEPENDENT_AMBULATORY_CARE_PROVIDER_SITE_OTHER): Payer: BLUE CROSS/BLUE SHIELD | Admitting: Podiatry

## 2017-04-04 VITALS — BP 132/82 | HR 65 | Temp 98.4°F

## 2017-04-04 DIAGNOSIS — E2839 Other primary ovarian failure: Secondary | ICD-10-CM | POA: Insufficient documentation

## 2017-04-04 DIAGNOSIS — M2010 Hallux valgus (acquired), unspecified foot: Secondary | ICD-10-CM | POA: Diagnosis not present

## 2017-04-04 DIAGNOSIS — M21621 Bunionette of right foot: Secondary | ICD-10-CM

## 2017-04-04 DIAGNOSIS — M8580 Other specified disorders of bone density and structure, unspecified site: Secondary | ICD-10-CM | POA: Insufficient documentation

## 2017-04-04 DIAGNOSIS — N951 Menopausal and female climacteric states: Secondary | ICD-10-CM | POA: Insufficient documentation

## 2017-04-04 DIAGNOSIS — N898 Other specified noninflammatory disorders of vagina: Secondary | ICD-10-CM | POA: Insufficient documentation

## 2017-04-04 DIAGNOSIS — N959 Unspecified menopausal and perimenopausal disorder: Secondary | ICD-10-CM | POA: Insufficient documentation

## 2017-04-04 NOTE — Progress Notes (Signed)
Subjective:   Patient ID: Kara Ramos, female   DOB: 67 y.o.   MRN: [id number]   HPI Patient states doing real well with the right foot with minimal discomfort and able to walk without pain.   ROS      Objective:  Physical Exam  Neurovascular status intact with patient's right foot healing well with wound edges well coapted hallux in rectus position digit second in good alignment with pin in place     Assessment:  Doing well post forefoot reconstruction right     Plan:  Advised patient on continued compression immobilization and elevation.  Reappoint for [place] to recheck again in sterile dressing reapplied today and will have stitches removed in 2 weeks  X-rays indicate osteotomy is healing well.  In place second toe

## 2017-04-11 ENCOUNTER — Encounter: Payer: Self-pay | Admitting: Internal Medicine

## 2017-04-13 ENCOUNTER — Telehealth: Payer: Self-pay | Admitting: Podiatry

## 2017-04-13 NOTE — Telephone Encounter (Signed)
I informed pt that it was not unusual for pins to back out of the toes, and often increased activity helped to work the pin out. I told pt to cover the end of the pin, remain in the surgical shoe, and decrease activity. I told pt that if the pin did come out cover with a antibiotic bandaid and call our office even over the weekend. Pt states understanding.

## 2017-04-13 NOTE — Telephone Encounter (Signed)
I had surgery with Dr. Paulla Dolly on 12 February. There is a pin in my 2nd right toe that looks like it is pushing itself out and I wanted to know if that is normal. I didn't know if I needed to be seen? I have an appointment scheduled on 20 March to have the pin removed but by appearance it looks like it is out quite a bit more than what it was to begin with. My number is [phone]. Thank you.

## 2017-04-19 ENCOUNTER — Ambulatory Visit (INDEPENDENT_AMBULATORY_CARE_PROVIDER_SITE_OTHER): Payer: BLUE CROSS/BLUE SHIELD

## 2017-04-19 ENCOUNTER — Ambulatory Visit (INDEPENDENT_AMBULATORY_CARE_PROVIDER_SITE_OTHER): Payer: BLUE CROSS/BLUE SHIELD | Admitting: Podiatry

## 2017-04-19 ENCOUNTER — Telehealth: Payer: Self-pay | Admitting: Podiatry

## 2017-04-19 DIAGNOSIS — M21621 Bunionette of right foot: Secondary | ICD-10-CM

## 2017-04-19 DIAGNOSIS — M2041 Other hammer toe(s) (acquired), right foot: Secondary | ICD-10-CM | POA: Diagnosis not present

## 2017-04-19 NOTE — Telephone Encounter (Signed)
I called last Friday because the pin in my toe that I had hammertoe surgery on has been backing out. It had slowed up some, but it is out about 3 quarters of an inch from the end of the toe to the bottom of the pin. I'm just very concerned that its going to get caught on something. My number is [phone]. Just looking for suggestions or do you think Dr. Paulla Dolly would like to go ahead and pull it out since I'm not scheduled to come back in until 20 March for it to be pulled out.

## 2017-04-19 NOTE — Telephone Encounter (Signed)
I informed pt, she would benefit from an appt with Gretta Arab, RN today and transferred to schedulers.

## 2017-04-21 NOTE — Progress Notes (Signed)
Subjective: Kara Ramos is a 67 y.o. is seen today in office s/p hammertoe repair as well as tailor's bunion preformed on 03/27/2017 with Dr. Paulla Dolly.  He presents today as an add-on appointment due to the wire to the second toe coming out more and she also has significant pain pointing submetatarsal 5.  She denies any recent injury or trauma.  She denies any swelling of redness increase.  Denies any open sores.  Denies any systemic complaints such as fevers, chills, nausea, vomiting. No calf pain, chest pain, shortness of breath.   Objective: General: No acute distress, AAOx3  DP/PT pulses palpable 2/4, CRT < 3 sec to all digits.  Protective sensation intact. Motor function intact.  Right foot: Incision is well coapted without any evidence of dehiscence incisions are all well-healed. There is no surrounding erythema, ascending cellulitis, fluctuance, crepitus, malodor, drainage/purulence. There is mild edema around the surgical site.  There is tenderness to the fifth metatarsal head plantarly.  The wire to the second toe has come out some.  There is no drainage or pus coming from the K wire site and there is no clinical signs of infection noted to the foot. No other areas of tenderness to bilateral lower extremities.  No other open lesions or pre-ulcerative lesions.  No pain with calf compression, swelling, warmth, erythema.   Assessment and Plan:  Status post RIGHT foot surgery presents today for increased pain and wire coming out.    -Treatment options discussed including all alternatives, risks, and complications -X-rays were obtained and reviewed.  K wire still across the PIPJ.  History of multiple metatarsal appears to so therefore left intact.  We did bandage it so that with the K wire but not get caught on anything but I did not want to push it back in. -Discussed with her screw removal once the bone heals more. She was alright with having this done in the near future should her pain  continue.  -We did offlaod the 5th metatarsal today to help decrease the pressure to the area.  -Ice/elevation -Pain medication as needed. -Monitor for any clinical signs or symptoms of infection and DVT/PE and directed to call the office immediately should any occur or go to the ER. -Follow-up in 1 week with Dr. Paulla Dolly or sooner if any problems arise. In the meantime, encouraged to call the office with any questions, concerns, change in symptoms.   Celesta Gentile, DPM

## 2017-04-23 ENCOUNTER — Encounter: Payer: Self-pay | Admitting: Internal Medicine

## 2017-04-26 ENCOUNTER — Encounter: Payer: Self-pay | Admitting: Podiatry

## 2017-04-26 ENCOUNTER — Ambulatory Visit (INDEPENDENT_AMBULATORY_CARE_PROVIDER_SITE_OTHER): Payer: BLUE CROSS/BLUE SHIELD | Admitting: Podiatry

## 2017-04-26 ENCOUNTER — Ambulatory Visit (INDEPENDENT_AMBULATORY_CARE_PROVIDER_SITE_OTHER): Payer: BLUE CROSS/BLUE SHIELD

## 2017-04-26 VITALS — BP 92/55 | HR 67 | Resp 16

## 2017-04-26 DIAGNOSIS — M21621 Bunionette of right foot: Secondary | ICD-10-CM

## 2017-04-26 NOTE — Progress Notes (Signed)
Subjective:   Patient ID: Kara Ramos, female   DOB: 67 y.o.   MRN: [id number]   HPI Patient presents stating she is doing well with minimal discomfort but states she wants the pin removed from the second digit and the pain in the outside the foot is manageable   ROS      Objective:  Physical Exam  Neurovascular status intact negative Homans sign noted with pin that has moved in a distal direction right second toe and is ready to be removed with excellent alignment second digit with minimal discomfort currently underneath the fifth metatarsal head right     Assessment:  Doing well post surgery second and fifth digits right metatarsal second right     Plan:  H&P x-rays reviewed pin removed second digit with sterile dressing applied.  Given instructions on gradual increase in activity levels continued elevation compression immobilization and explained possible screw removal fifth metatarsal and I applied padding to take pressure off the area but I am not convinced the screw is causing the problem and we will just evaluated over the next 4-6 weeks.  Reappoint at that time or earlier if needed  X-ray indicates that the screws appear to be in place and its possible the fifth metatarsal screw might be slightly long and there is some slight secondary bone healing fifth metatarsal right but it should heal uneventfully

## 2017-05-02 ENCOUNTER — Other Ambulatory Visit: Payer: BLUE CROSS/BLUE SHIELD | Admitting: Podiatry

## 2017-05-14 HISTORY — PX: COLONOSCOPY: SHX174

## 2017-05-31 ENCOUNTER — Encounter: Payer: Self-pay | Admitting: Podiatry

## 2017-05-31 ENCOUNTER — Ambulatory Visit (INDEPENDENT_AMBULATORY_CARE_PROVIDER_SITE_OTHER): Payer: BLUE CROSS/BLUE SHIELD

## 2017-05-31 ENCOUNTER — Ambulatory Visit (INDEPENDENT_AMBULATORY_CARE_PROVIDER_SITE_OTHER): Payer: BLUE CROSS/BLUE SHIELD | Admitting: Podiatry

## 2017-05-31 DIAGNOSIS — M2041 Other hammer toe(s) (acquired), right foot: Secondary | ICD-10-CM | POA: Diagnosis not present

## 2017-05-31 NOTE — Progress Notes (Signed)
Subjective:   Patient ID: Kara Ramos, female   DOB: 67 y.o.   MRN: [id number]   HPI Patient states she feels like something is loose in her foot but overall is extremely happy with the results after surgery 2 months ago   ROS      Objective:  Physical Exam  Neurovascular status intact with patient's right foot doing well with wound edges well coapted in good range of motion with minimal edema noted for this.  Postop with mild discomfort around the fourth metatarsal     Assessment:  Doing well overall with inflammatory changes with no indications of significant pathology with good healing occurring     Plan:  H&P x-rays reviewed and at this time I recommended anti-inflammatories supportive shoe and that this should gradually go away.  Reappoint as symptoms indicate  X-rays indicate the osteotomies have healed well good alignment good position of the second toe

## 2017-06-19 ENCOUNTER — Ambulatory Visit (AMBULATORY_SURGERY_CENTER): Payer: Self-pay

## 2017-06-19 VITALS — Ht 62.0 in | Wt 175.4 lb

## 2017-06-19 DIAGNOSIS — Z86010 Personal history of colonic polyps: Secondary | ICD-10-CM

## 2017-06-19 MED ORDER — NA SULFATE-K SULFATE-MG SULF 17.5-3.13-1.6 GM/177ML PO SOLN: 1.0000 | Freq: Once | ORAL | 0 refills | Status: AC

## 2017-06-19 NOTE — Progress Notes (Signed)
Per pt, no allergies to soy or egg products.Pt not taking any weight loss meds or using  O2 at home.  Pt refused emmi video. 

## 2017-06-25 ENCOUNTER — Encounter: Payer: Self-pay | Admitting: Internal Medicine

## 2017-07-03 ENCOUNTER — Encounter: Payer: Self-pay | Admitting: Internal Medicine

## 2017-07-03 ENCOUNTER — Ambulatory Visit (AMBULATORY_SURGERY_CENTER): Payer: BLUE CROSS/BLUE SHIELD | Admitting: Internal Medicine

## 2017-07-03 ENCOUNTER — Other Ambulatory Visit: Payer: Self-pay

## 2017-07-03 VITALS — BP 130/76 | HR 73 | Temp 98.6°F | Resp 14 | Ht 62.0 in | Wt 175.0 lb

## 2017-07-03 DIAGNOSIS — Z86010 Personal history of colonic polyps: Secondary | ICD-10-CM | POA: Diagnosis not present

## 2017-07-03 DIAGNOSIS — D122 Benign neoplasm of ascending colon: Secondary | ICD-10-CM

## 2017-07-03 MED ORDER — SODIUM CHLORIDE 0.9 % IV SOLN: 500.0000 mL | Freq: Once | INTRAVENOUS | Status: DC

## 2017-07-03 NOTE — Progress Notes (Signed)
Report to PACU, RN, vss, BBS= Clear.  

## 2017-07-03 NOTE — Progress Notes (Signed)
Called to room to assist during endoscopic procedure.  Patient ID and intended procedure confirmed with present staff. Received instructions for my participation in the procedure from the performing physician.  

## 2017-07-03 NOTE — Patient Instructions (Signed)
YOU HAD AN ENDOSCOPIC PROCEDURE TODAY AT THE [facility]:   Refer to the procedure report that was given to you for any specific questions about what was found during the examination.  If the procedure report does not answer your questions, please call your gastroenterologist to clarify.  If you requested that your care partner not be given the details of your procedure findings, then the procedure report has been included in a sealed envelope for you to review at your convenience later.  YOU SHOULD EXPECT: Some feelings of bloating in the abdomen. Passage of more gas than usual.  Walking can help get rid of the air that was put into your GI tract during the procedure and reduce the bloating. If you had a lower endoscopy (such as a colonoscopy or flexible sigmoidoscopy) you may notice spotting of blood in your stool or on the toilet paper. If you underwent a bowel prep for your procedure, you may not have a normal bowel movement for a few days.  Please Note:  You might notice some irritation and congestion in your nose or some drainage.  This is from the oxygen used during your procedure.  There is no need for concern and it should clear up in a day or so.  SYMPTOMS TO REPORT IMMEDIATELY:   Following lower endoscopy (colonoscopy or flexible sigmoidoscopy):  Excessive amounts of blood in the stool  Significant tenderness or worsening of abdominal pains  Swelling of the abdomen that is new, acute  Fever of 100F or higher   For urgent or emergent issues, a gastroenterologist can be reached at any hour by calling [phone].   DIET:  We do recommend a small meal at first, but then you may proceed to your regular diet.  Drink plenty of fluids but you should avoid alcoholic beverages for 24 hours.  ACTIVITY:  You should plan to take it easy for the rest of today and you should NOT DRIVE or use heavy machinery until tomorrow (because of the sedation medicines used during the test).     FOLLOW UP: Our staff will call the number listed on your records the next business day following your procedure to check on you and address any questions or concerns that you may have regarding the information given to you following your procedure. If we do not reach you, we will leave a message.  However, if you are feeling well and you are not experiencing any problems, there is no need to return our call.  We will assume that you have returned to your regular daily activities without incident.  If any biopsies were taken you will be contacted by phone or by letter within the next 1-3 weeks.  Please call us at [phone] if you have not heard about the biopsies in 3 weeks.    SIGNATURES/CONFIDENTIALITY: You and/or your care partner have signed paperwork which will be entered into your electronic medical record.  These signatures attest to the fact that that the information above on your After Visit Summary has been reviewed and is understood.  Full responsibility of the confidentiality of this discharge information lies with you and/or your care-partner.  Thank you for letting us take care of your healthcare needs today. 

## 2017-07-03 NOTE — Op Note (Signed)
[place] Patient Name: Serinity Ware Procedure Date: 07/03/2017 11:04 AM MRN: [id number] Endoscopist: Docia Chuck. Henrene Pastor , MD Age: 67 Referring MD:  Date of Birth: 03-May-1950 Gender: Female Account #: [id number] Procedure:                Colonoscopy, with cold snare polypectomy x 1 Indications:              High risk colon cancer surveillance: Personal                            history of multiple (3 or more) adenomas. Multiple                            prior colonoscopies. Last examination 2014. History                            of distal sigmoid resection for endometriosis,                            remotely Medicines:                Monitored Anesthesia Care Procedure:                Pre-Anesthesia Assessment:                           - Prior to the procedure, a History and Physical                            was performed, and patient medications and                            allergies were reviewed. The patient's tolerance of                            previous anesthesia was also reviewed. The risks                            and benefits of the procedure and the sedation                            options and risks were discussed with the patient.                            All questions were answered, and informed consent                            was obtained. Prior Anticoagulants: The patient has                            taken no previous anticoagulant or antiplatelet                            agents. ASA Grade Assessment: II - A patient with  mild systemic disease. After reviewing the risks                            and benefits, the patient was deemed in                            satisfactory condition to undergo the procedure.                           After obtaining informed consent, the colonoscope                            was passed under direct vision. Throughout the                            procedure, the patient's  blood pressure, pulse, and                            oxygen saturations were monitored continuously. The                            Model CF-HQ190L [id number]) scope was introduced                            through the anus and advanced to the the cecum,                            identified by appendiceal orifice and ileocecal                            valve. The ileocecal valve, appendiceal orifice,                            and rectum were photographed. The quality of the                            bowel preparation was excellent. The colonoscopy                            was performed without difficulty. The patient                            tolerated the procedure well. The bowel preparation                            used was SUPREP. Scope In: 11:14:25 AM Scope Out: 11:24:27 AM Scope Withdrawal Time: 0 hours 7 minutes 38 seconds  Total Procedure Duration: 0 hours 10 minutes 2 seconds  Findings:                 A 4 mm polyp was found in the ascending colon. The                            polyp was removed with a cold snare. Resection and  retrieval were complete.                           A single medium-mouthed diverticulum was found in                            the sigmoid colon.                           The exam was otherwise without abnormality on                            direct and retroflexion views. Complications:            No immediate complications. Estimated blood loss:                            None. Estimated Blood Loss:     Estimated blood loss: none. Impression:               - One 4 mm polyp in the ascending colon, removed                            with a cold snare. Resected and retrieved.                           - Diverticulosis in the sigmoid colon.                           - The examination was otherwise normal on direct                            and retroflexion views. Recommendation:           - Repeat colonoscopy in  5 years for surveillance.                           - Patient has a contact number available for                            emergencies. The signs and symptoms of potential                            delayed complications were discussed with the                            patient. Return to normal activities tomorrow.                            Written discharge instructions were provided to the                            patient.                           - Resume previous diet.                           -  Continue present medications.                           - Await pathology results. Docia Chuck. Henrene Pastor, MD 07/03/2017 11:28:28 AM This report has been signed electronically.

## 2017-07-04 ENCOUNTER — Telehealth: Payer: Self-pay | Admitting: *Deleted

## 2017-07-04 NOTE — Telephone Encounter (Signed)
  Follow up Call-  Call back number 07/03/2017  Post procedure Call Back phone  # [phone]  Permission to leave phone message Yes  Some recent data might be hidden     Patient questions:  Do you have a fever, pain , or abdominal swelling? No. Pain Score  0 *  Have you tolerated food without any problems? Yes.    Have you been able to return to your normal activities? Yes.    Do you have any questions about your discharge instructions: Diet   No. Medications  No. Follow up visit  No.  Do you have questions or concerns about your Care? No.  Actions: * If pain score is 4 or above: No action needed, pain <4.

## 2017-07-05 ENCOUNTER — Encounter: Payer: Self-pay | Admitting: Internal Medicine

## 2017-07-12 ENCOUNTER — Ambulatory Visit (INDEPENDENT_AMBULATORY_CARE_PROVIDER_SITE_OTHER): Payer: BLUE CROSS/BLUE SHIELD | Admitting: Podiatry

## 2017-07-12 ENCOUNTER — Ambulatory Visit (INDEPENDENT_AMBULATORY_CARE_PROVIDER_SITE_OTHER): Payer: BLUE CROSS/BLUE SHIELD

## 2017-07-12 ENCOUNTER — Encounter: Payer: Self-pay | Admitting: Podiatry

## 2017-07-12 DIAGNOSIS — M779 Enthesopathy, unspecified: Secondary | ICD-10-CM | POA: Diagnosis not present

## 2017-07-12 DIAGNOSIS — M2041 Other hammer toe(s) (acquired), right foot: Secondary | ICD-10-CM | POA: Diagnosis not present

## 2017-07-12 NOTE — Progress Notes (Signed)
Subjective:   Patient ID: Kara Ramos, female   DOB: 67 y.o.   MRN: [id number]   HPI Patient states overall doing well but I am feeling a lot of prominence against my metatarsals and they get sore when I walk   ROS      Objective:  Physical Exam  Neurovascular status intact with significant diminishment of discomfort of the lesser MPJs right where we did the surgery but there is prominence of the metatarsal head third and fourth with mild inflammation occurring.  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  Patient will be seen back when orthotics are returned and will be scheduled for PET orthotist visit  X-rays indicate the osteotomies are healing well second toe in good alignment and not elevated

## 2017-08-07 ENCOUNTER — Ambulatory Visit: Payer: BLUE CROSS/BLUE SHIELD | Admitting: Orthotics

## 2017-08-07 DIAGNOSIS — M779 Enthesopathy, unspecified: Secondary | ICD-10-CM

## 2017-08-07 DIAGNOSIS — M2010 Hallux valgus (acquired), unspecified foot: Secondary | ICD-10-CM

## 2017-08-07 DIAGNOSIS — M2041 Other hammer toe(s) (acquired), right foot: Secondary | ICD-10-CM

## 2017-08-07 NOTE — Progress Notes (Signed)
Patient came in today to pick up custom made foot orthotics.  The goals were accomplished and the patient reported no dissatisfaction with said orthotics.  Patient was advised of breakin period and how to report any issues. 

## 2017-08-31 ENCOUNTER — Other Ambulatory Visit: Payer: Self-pay | Admitting: Family Medicine

## 2017-08-31 DIAGNOSIS — N390 Urinary tract infection, site not specified: Secondary | ICD-10-CM | POA: Diagnosis not present

## 2017-08-31 DIAGNOSIS — Z1231 Encounter for screening mammogram for malignant neoplasm of breast: Secondary | ICD-10-CM

## 2017-09-27 DIAGNOSIS — N3000 Acute cystitis without hematuria: Secondary | ICD-10-CM | POA: Diagnosis not present

## 2017-09-27 DIAGNOSIS — Z23 Encounter for immunization: Secondary | ICD-10-CM | POA: Diagnosis not present

## 2017-09-27 DIAGNOSIS — R946 Abnormal results of thyroid function studies: Secondary | ICD-10-CM | POA: Diagnosis not present

## 2017-09-27 DIAGNOSIS — I493 Ventricular premature depolarization: Secondary | ICD-10-CM | POA: Diagnosis not present

## 2017-09-27 DIAGNOSIS — Z0000 Encounter for general adult medical examination without abnormal findings: Secondary | ICD-10-CM | POA: Diagnosis not present

## 2017-09-27 DIAGNOSIS — K582 Mixed irritable bowel syndrome: Secondary | ICD-10-CM | POA: Diagnosis not present

## 2017-10-12 ENCOUNTER — Ambulatory Visit
Admission: RE | Admit: 2017-10-12 | Discharge: 2017-10-12 | Disposition: A | Discharge status: Home / Self Care | Payer: Medicare Other | Source: Ambulatory Visit | Attending: Family Medicine | Admitting: Family Medicine

## 2017-10-12 DIAGNOSIS — Z1231 Encounter for screening mammogram for malignant neoplasm of breast: Secondary | ICD-10-CM | POA: Diagnosis not present

## 2017-10-17 DIAGNOSIS — N3000 Acute cystitis without hematuria: Secondary | ICD-10-CM | POA: Diagnosis not present

## 2017-10-17 DIAGNOSIS — K582 Mixed irritable bowel syndrome: Secondary | ICD-10-CM | POA: Diagnosis not present

## 2017-10-17 DIAGNOSIS — Z0000 Encounter for general adult medical examination without abnormal findings: Secondary | ICD-10-CM | POA: Diagnosis not present

## 2017-10-17 DIAGNOSIS — I493 Ventricular premature depolarization: Secondary | ICD-10-CM | POA: Diagnosis not present

## 2017-10-24 DIAGNOSIS — Z01419 Encounter for gynecological examination (general) (routine) without abnormal findings: Secondary | ICD-10-CM | POA: Diagnosis not present

## 2017-10-24 DIAGNOSIS — N959 Unspecified menopausal and perimenopausal disorder: Secondary | ICD-10-CM | POA: Diagnosis not present

## 2018-01-22 DIAGNOSIS — Z23 Encounter for immunization: Secondary | ICD-10-CM | POA: Diagnosis not present

## 2018-01-22 DIAGNOSIS — Z86018 Personal history of other benign neoplasm: Secondary | ICD-10-CM | POA: Diagnosis not present

## 2018-01-22 DIAGNOSIS — D2372 Other benign neoplasm of skin of left lower limb, including hip: Secondary | ICD-10-CM | POA: Diagnosis not present

## 2018-01-22 DIAGNOSIS — L570 Actinic keratosis: Secondary | ICD-10-CM | POA: Diagnosis not present

## 2018-03-21 ENCOUNTER — Encounter: Payer: Self-pay | Admitting: Cardiology

## 2018-03-21 ENCOUNTER — Ambulatory Visit: Payer: Medicare Other | Admitting: Cardiology

## 2018-03-21 VITALS — BP 113/68 | HR 80 | Ht 62.0 in | Wt 172.2 lb

## 2018-03-21 DIAGNOSIS — I493 Ventricular premature depolarization: Secondary | ICD-10-CM | POA: Insufficient documentation

## 2018-03-21 NOTE — Progress Notes (Signed)
Patient is here for follow up visit.  Subjective:   @Patient  ID: Kara Ramos, female    DOB: 27-May-1950, 68 y.o.   MRN: [id number]  Chief Complaint  Patient presents with  . Follow-up    1 year f/u.Kara Ramos, pre syncope    HPI   68 year old Caucasian female with h/o PVC's, episode of presyncope in 2018, here for 1 year follow up.   Patient's prior cardiac workup in 2018 was essentially unremarkable with normal LV function, mild MR, possible PFO. Exercise stress test with normal exercise capacity and no ischemic changes. Event monitor showed frequent PVCs, one 3 second pause at 1:49 AM, no other abnormalities.  She was started on beta blockers for frequent PVC's. At last visit in 2018, I had swithced her metoprolol tartarate 12.5 mg twice daily to metoprolol succiante 25 mg once daily for ease of administration.   Since the last visit, she reports couple of incidents of "twinges" in her chest, lasting only for a second or so. She has had several episodes of "generalized weakness" coming out of shower. HR on her phone was in 70's. BP was 88/48 mmHg. She is trying to come off hormone replacement, trying to low fiber diet. She denies chest pain, shortness of breath, palpitations, leg edema, orthopnea, PND, TIA/syncope.   Past Medical History:  Diagnosis Date  . Allergy   . Arthritis   . Cancer [facility]) 1978   cervical- had hysterectomy  . GERD (gastroesophageal reflux disease)   . History of IBS   . Irregular heart rhythm    occasional PAC'S    Past Surgical History:  Procedure Laterality Date  . ABDOMINAL HYSTERECTOMY     both ovaries removed  . BUNIONECTOMY     both feet  . CATARACT EXTRACTION     right  . COLON SURGERY  1988   part of colon removed d/t endometriosis  . COLONOSCOPY  05/2017   One benign polyp  . CORRECTION HAMMER TOE  03/2017    Social History   Socioeconomic History  . Marital status: Widowed    Spouse name: Not on file  . Number of  children: 0  . Years of education: Not on file  . Highest education level: Not on file  Occupational History  . Not on file  Social Needs  . Financial resource strain: Not on file  . Food insecurity:    Worry: Not on file    Inability: Not on file  . Transportation needs:    Medical: Not on file    Non-medical: Not on file  Tobacco Use  . Smoking status: Former Smoker    Packs/day: 1.00    Types: Cigarettes    Last attempt to quit: 1977    Years since quitting: 43.1  . Smokeless tobacco: Never Used  Substance and Sexual Activity  . Alcohol use: Yes    Comment: occasional  . Drug use: No  . Sexual activity: Not on file  Lifestyle  . Physical activity:    Days per week: Not on file    Minutes per session: Not on file  . Stress: Not on file  Relationships  . Social connections:    Talks on phone: Not on file    Gets together: Not on file    Attends religious service: Not on file    Active member of club or organization: Not on file    Attends meetings of clubs or organizations: Not on file    Relationship  status: Not on file  . Intimate partner violence:    Fear of current or ex partner: Not on file    Emotionally abused: Not on file    Physically abused: Not on file    Forced sexual activity: Not on file  Other Topics Concern  . Not on file  Social History Narrative  . Not on file    Current Outpatient Medications on File Prior to Visit  Medication Sig Dispense Refill  . albuterol (PROAIR HFA) 108 (90 Base) MCG/ACT inhaler Take 2 puffs by mouth 2 (two) times daily as needed.    Marland Kitchen CRANBERRY PO Take 1 tablet by mouth daily.    Marland Kitchen desonide (DESOWEN) 0.05 % cream desonide 0.05 % topical cream prn for dandruff    . fexofenadine (ALLEGRA) 180 MG tablet Take 180 mg by mouth as needed.    . fluticasone (FLONASE) 50 MCG/ACT nasal spray Place into both nostrils as needed for allergies or rhinitis.    . Ibuprofen (ADVIL PO) Take 200 mg by mouth as needed.     Marland Kitchen  levocetirizine (XYZAL) 5 MG tablet Take 5 mg by mouth as needed for allergies.    . Metoprolol Succinate 25 MG CS24 Take 1 tablet by mouth daily.     . NON FORMULARY Take 1 tablet by mouth daily. Estreven with Melatonin    . Omeprazole (PRILOSEC PO) Take 20 mg by mouth daily.     . Polyethylene Glycol 400 (BLINK TEARS OP) Apply to eye as needed.    . prednisoLONE acetate (PRED MILD) 0.12 % ophthalmic suspension Place 1 drop into the left eye as needed.    . Probiotic Product (PROBIOTIC PO) Take 1 tablet by mouth daily.    Marland Kitchen triamcinolone cream (KENALOG) 0.1 % APPLY BID X 14 DAYS; pt takes as needed  1  . estradiol (VIVELLE-DOT) 0.025 MG/24HR estradiol 0.025 mg/24 hr semiweekly transdermal patch  UNW AND APP 1 PA TO SKIN TWICE A WEEK UTD    . nitrofurantoin, macrocrystal-monohydrate, (MACROBID) 100 MG capsule TK 1 C PO Q 12 H WF FOR 7 DAYS  0   No current facility-administered medications on file prior to visit.     Cardiovascular studies:  Echocardiogram 12/12/2016: Left ventricle cavity is normal in size. Mild concentric hypertrophy of the left ventricle. Normal global wall motion. LVEF 55-60% Left atrial cavity is mildly dilated. 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 Mild (Grade I) mitral regurgitation. IVC is dilated with a respiratory response of <50%. This may suggest elevated right heart pressure.  Event monitor 11/09/2016-12/08/2016: Sinus rhythm with tachycardia up to 140 bpm. Sinus bradycardia with 3.0 sec pause at 1:49 AM. No symptoms associated with this. Occasional lightheadedness, flutter during sinus rhythm. No atrial fibrillation, atrial flutter.  Treadmill exercise stress test 11/13/2016: Resting EKG: Normal sinus rhythm. Stress EKG: Negative for ischemia Normal blood pressure response. Occasional PACs throughout the stress test. No atrial fibrillation or heart block. Normal pressure response. Exercised for 5:02 minutes, achieved 4.9  mets. Exercise capacity is low.   Review of Systems  Constitution: Negative for decreased appetite, malaise/fatigue, weight gain and weight loss.  HENT: Negative for congestion.   Eyes: Negative for visual disturbance.  Cardiovascular: Negative for chest pain, dyspnea on exertion, leg swelling and syncope.  Respiratory: Negative for shortness of breath.   Endocrine: Negative for cold intolerance.  Hematologic/Lymphatic: Does not bruise/bleed easily.  Skin: Negative for itching and rash.  Musculoskeletal: Negative for myalgias.  Gastrointestinal: Negative for  abdominal pain, nausea and vomiting.  Genitourinary: Negative for dysuria.  Neurological: Negative for dizziness and weakness.  Psychiatric/Behavioral: The patient is not nervous/anxious.   All other systems reviewed and are negative.      Objective:   Vitals:   03/21/18 1025 03/21/18 1026  SpO2: 98% 98%     Physical Exam  Constitutional: She is oriented to person, place, and time. She appears well-developed and well-nourished. No distress.  HENT:  Head: Normocephalic and atraumatic.  Eyes: Pupils are equal, round, and reactive to light. Conjunctivae are normal.  Neck: No JVD present.  Cardiovascular: Normal rate and regular rhythm.  Pulmonary/Chest: Effort normal and breath sounds normal. She has no wheezes. She has no rales.  Abdominal: Soft. Bowel sounds are normal. There is no rebound.  Musculoskeletal:        General: No edema.  Lymphadenopathy:    She has no cervical adenopathy.  Neurological: She is alert and oriented to person, place, and time. No cranial nerve deficit.  Skin: Skin is warm and dry.  Psychiatric: She has a normal mood and affect.  Nursing note and vitals reviewed.       Assessment & Recommendations:     ICD-10-CM   1. PVC (premature ventricular contraction): EKG 03/21/2018: Sinus  Rhythm  Normal EKG  Currrntly controlled. Continue metoprolol succinate 25 mg daily. Her chest  "twinges" are nonaginal, and could be related to PVC. Episodes of generalized weakness after shower are likely vasovagal related to heat exhaustion. Avoid long, hot showers.  I will obtain lipid panel from her PCP for our records, performed in 09/2017.  I49.3 EKG 12-Lead   I will see her back in 1 year.   Nigel Mormon, MD [facility] Pager: [phone] Office: [phone] If no answer Cell [phone]

## 2018-05-31 DIAGNOSIS — N390 Urinary tract infection, site not specified: Secondary | ICD-10-CM | POA: Diagnosis not present

## 2018-06-25 DIAGNOSIS — Z0120 Encounter for dental examination and cleaning without abnormal findings: Secondary | ICD-10-CM | POA: Diagnosis not present

## 2018-07-30 DIAGNOSIS — H43812 Vitreous degeneration, left eye: Secondary | ICD-10-CM | POA: Diagnosis not present

## 2018-07-30 DIAGNOSIS — H16223 Keratoconjunctivitis sicca, not specified as Sjogren's, bilateral: Secondary | ICD-10-CM | POA: Diagnosis not present

## 2018-08-12 DIAGNOSIS — H5212 Myopia, left eye: Secondary | ICD-10-CM | POA: Diagnosis not present

## 2018-09-05 ENCOUNTER — Other Ambulatory Visit: Payer: Self-pay | Admitting: Obstetrics and Gynecology

## 2018-09-05 DIAGNOSIS — M810 Age-related osteoporosis without current pathological fracture: Secondary | ICD-10-CM

## 2018-09-20 ENCOUNTER — Other Ambulatory Visit: Payer: Self-pay | Admitting: Family Medicine

## 2018-09-20 DIAGNOSIS — Z1231 Encounter for screening mammogram for malignant neoplasm of breast: Secondary | ICD-10-CM

## 2018-10-04 DIAGNOSIS — F5101 Primary insomnia: Secondary | ICD-10-CM | POA: Diagnosis not present

## 2018-10-04 DIAGNOSIS — Z0000 Encounter for general adult medical examination without abnormal findings: Secondary | ICD-10-CM | POA: Diagnosis not present

## 2018-10-04 DIAGNOSIS — I952 Hypotension due to drugs: Secondary | ICD-10-CM | POA: Diagnosis not present

## 2018-10-04 DIAGNOSIS — I493 Ventricular premature depolarization: Secondary | ICD-10-CM | POA: Diagnosis not present

## 2018-10-17 ENCOUNTER — Ambulatory Visit
Admission: RE | Admit: 2018-10-17 | Discharge: 2018-10-17 | Disposition: A | Discharge status: Home / Self Care | Payer: Medicare Other | Source: Ambulatory Visit

## 2018-10-17 ENCOUNTER — Other Ambulatory Visit: Payer: Self-pay

## 2018-10-17 DIAGNOSIS — Z1231 Encounter for screening mammogram for malignant neoplasm of breast: Secondary | ICD-10-CM | POA: Diagnosis not present

## 2018-10-18 ENCOUNTER — Other Ambulatory Visit: Payer: Self-pay | Admitting: Obstetrics and Gynecology

## 2018-10-18 DIAGNOSIS — R921 Mammographic calcification found on diagnostic imaging of breast: Secondary | ICD-10-CM

## 2018-10-23 ENCOUNTER — Encounter: Payer: Self-pay | Admitting: Allergy

## 2018-10-23 ENCOUNTER — Ambulatory Visit (INDEPENDENT_AMBULATORY_CARE_PROVIDER_SITE_OTHER): Payer: Medicare Other | Admitting: Allergy

## 2018-10-23 ENCOUNTER — Other Ambulatory Visit: Payer: Self-pay

## 2018-10-23 VITALS — BP 100/70 | HR 76 | Temp 97.9°F | Resp 16 | Ht 62.0 in | Wt 172.6 lb

## 2018-10-23 DIAGNOSIS — J4520 Mild intermittent asthma, uncomplicated: Secondary | ICD-10-CM

## 2018-10-23 DIAGNOSIS — J3089 Other allergic rhinitis: Secondary | ICD-10-CM

## 2018-10-23 DIAGNOSIS — T781XXD Other adverse food reactions, not elsewhere classified, subsequent encounter: Secondary | ICD-10-CM | POA: Diagnosis not present

## 2018-10-23 DIAGNOSIS — T781XXA Other adverse food reactions, not elsewhere classified, initial encounter: Secondary | ICD-10-CM | POA: Insufficient documentation

## 2018-10-23 MED ORDER — AZELASTINE HCL 0.15 % NA SOLN: NASAL | 5 refills | Status: DC

## 2018-10-23 NOTE — Assessment & Plan Note (Addendum)
Diagnosed with IBS and noticing worsening abdominal pains and diarrhea the last few years. Concerned for food allergies. Skin testing as a teenager was positive to milk, corn, wheat, grains, coffee, grapefruit, shrimp per patient report. H/o distal sigmoid resection. Follows with GI.   Today's skin testing showed: Borderline positive to wheat, shellfish mix, trout, oat, rye, onion and pineapple.   Discussed with patient that her symptoms and clinical history are not typical of an IgE mediated reaction which is what skin prick testing looks for.   Nevertheless, advised to avoid above foods and monitor symptoms.   Keep food journal/diary to see if she can identify any trigger foods.  For mild symptoms you can take over the counter antihistamines such as Benadryl and monitor symptoms closely. If symptoms worsen or if you have severe symptoms including breathing issues, throat closure, significant swelling, whole body hives, severe diarrhea and vomiting, lightheadedness then seek immediate medical care.

## 2018-10-23 NOTE — Progress Notes (Signed)
New Patient Note  RE: Kara Ramos MRN: [id number] DOB: December 07, 1950 Date of Office Visit: 10/23/2018  Referring provider: Vernie Shanks, MD Primary care provider: Vernie Shanks, MD  Chief Complaint: Allergic Rhinitis  (pollen, ) and Food Intolerance (wheat, dairy, corn, coffee, )  History of Present Illness: I had the pleasure of seeing Kara Ramos for initial evaluation at the [facility] on 10/23/2018. She is a 68 y.o. female, who is referred here by Vernie Shanks, MD for the evaluation of food allergies.   Foods: Patient is having worsening IBS symptoms with abdominal pains and diarrhea. She tried FODMAP diet earlier this year and noticed worsening symptoms when she tried to reintroduce grains back into her diet.  The reactions usually occur a few hours after ingestion. She denies any hives, swelling but does experience some nasal congestion.  She does not take any medications for this. Symptoms resolve usually within 2-4 hours. This usually happens 1-2 times a week. She does follow with GI for IBS. She also had distal sigmoid resection in the past for endometriosis.    Past work up includes:skin testing as a teenager was positive to milk, corn, wheat, grains, coffee, grapefruit, shrimp per patient report.   Dietary History: patient has been eating other foods including yogurt, lactaid milk, occasional eggs, peanut, treenuts, sesame, seafood, shellfish, limited soy, limited wheat, meats, fruits and vegetables.  She reports reading labels and avoiding oysters in diet completely and limited certain foods.   Patient had colonoscopy about 1.5 year ago with some polyps.   Assessment and Plan: Kara Ramos is a 68 y.o. female with: Adverse food reaction Diagnosed with IBS and noticing worsening abdominal pains and diarrhea the last few years. Concerned for food allergies. Skin testing as a teenager was positive to milk, corn, wheat, grains, coffee, grapefruit,  shrimp per patient report. H/o distal sigmoid resection. Follows with GI.   Today's skin testing showed: Borderline positive to wheat, shellfish mix, trout, oat, rye, onion and pineapple.   Discussed with patient that her symptoms and clinical history are not typical of an IgE mediated reaction which is what skin prick testing looks for.   Nevertheless, advised to avoid above foods and monitor symptoms.   Keep food journal/diary to see if she can identify any trigger foods.  For mild symptoms you can take over the counter antihistamines such as Benadryl and monitor symptoms closely. If symptoms worsen or if you have severe symptoms including breathing issues, throat closure, significant swelling, whole body hives, severe diarrhea and vomiting, lightheadedness then seek immediate medical care.  Mild intermittent asthma without complication Used to have asthma as a teenager now only flares with URIs.   Today's spirometry was normal.  May use albuterol rescue inhaler 2 puffs or nebulizer every 4 to 6 hours as needed for shortness of breath, chest tightness, coughing, and wheezing. May use albuterol rescue inhaler 2 puffs 5 to 15 minutes prior to strenuous physical activities. Monitor frequency of use.   Other allergic rhinitis Perennial rhinitis symptoms since teenage years.  Currently using Flonase and Xyzal with some benefit.  Last skin testing was over 50 years ago which showed multiple positives per patient report.  Start azelastine nasal spray 1-2 sprays per nostril 1-2 times a day as needed for drainage.  May use over the counter antihistamines such as Zyrtec (cetirizine), Claritin (loratadine), Allegra (fexofenadine), or Xyzal (levocetirizine) daily as needed.  May use Flonase 1 spray 1-2 times a  day as needed for nasal congestion.   If symptoms worsen then consider environmental allergy testing in future.   Return in about 2 months (around 12/23/2018).  Meds ordered this encounter   Medications  . Azelastine HCl 0.15 % SOLN    Sig: 1-2 sprays per nostril 1-2 times a day as needed for runny nose.    Dispense:  30 mL    Refill:  5   Other allergy screening: Asthma: yes  Patient had it as a teenager and flares with URIs mainly right now. Takes albuterol prn with good benefit.  Rhino conjunctivitis: yes  She reports symptoms of rhinitis. Symptoms have been going on for many years. The symptoms are present all year around with worsening in fall and in the spring. She has used Flonase and xyzal with fair improvement in symptoms. Sinus infections: no. Previous work up includes: skin testing as a teenager showed multiple positives per patient report. Medication allergy: yes  Erythromycin - vomiting, rash. Azithromycin - rash Tessalon - itching, rash  Hymenoptera allergy: no Urticaria: yes in the past Eczema: yes in the past.  History of recurrent infections suggestive of immunodeficency: no  Diagnostics: Spirometry:  Tracings reviewed. Her effort: Good reproducible efforts. FVC: 2.74L FEV1: 1.93L, 89% predicted FEV1/FVC ratio: 70% Interpretation: Spirometry consistent with normal pattern.  Please see scanned spirometry results for details.  Skin Testing: Food allergy panel. Borderline positive to wheat, shellfish mix, trout, oat, rye, onion and pineapple.  Results discussed with patient/family. Food Adult Perc - 10/23/18 0900    Time Antigen Placed  [id number]    Allergen Manufacturer  Lavella Hammock    Location  Back    Number of allergen test  72    Panel 2  Select     Control-buffer 50% Glycerol  Negative    Control-Histamine 1 mg/ml  3+    1. Peanut  Negative    2. Soybean  Negative    3. Wheat  --   +/-   4. Sesame  Negative    5. Milk, cow  Negative    6. Egg White, Chicken  Negative    7. Casein  Negative    8. Shellfish Mix  --   +/-   9. Fish Mix  Negative    10. Cashew  Negative    11. Pecan Food  Negative    12. [place]  Negative    13. Almond   Negative    14. Hazelnut  Negative    15. [place] nut  Negative    16. Coconut  Negative    17. Pistachio  Negative    18. Catfish  Negative    19. Bass  Negative    20. Trout  --   +/-   21. Tuna  Negative    22. Salmon  Negative    23. Flounder  Negative    24. Codfish  Negative    25. Shrimp  Negative    26. Crab  Negative    27. Lobster  Negative    28. Oyster  Negative    29. Scallops  Negative    30. Barley  Negative    31. Oat   --   +/-   32. Rye   --   +/-   33. Hops  Negative    34. Rice  Negative    35. Cottonseed  Negative    36. Saccharomyces Cerevisiae   Negative    37. Pork  Negative  38. [place] Meat  Negative    39. Chicken Meat  Negative    40. Beef  Negative    41. Lamb  Negative    42. Tomato  Negative    43. White Potato  Negative    44. Sweet Potato  Negative    45. Pea, Green/English  Negative    46. [place] Bean  Negative    47. Mushrooms  Negative    48. Avocado  Negative    49. Onion  --   +/-   50. Cabbage  Negative    51. Carrots  Negative    52. Celery  Negative    53. Corn  Negative    54. Cucumber  Negative    55. Grape (White seedless)  Negative    56. Orange   Negative    57. Banana  Negative    58. Apple  Negative    59. Peach  Negative    60. Strawberry  Negative    61. Cantaloupe  Negative    62. Watermelon  Negative    63. Pineapple  --   +/-   64. Chocolate/Cacao bean  Negative    65. [place] Gum  Negative    66. Acacia (Arabic Gum)  Negative    67. Cinnamon  Negative    68. Nutmeg  Negative    69. Ginger  Negative    70. Garlic  Negative    71. Pepper, black  Negative    72. Mustard  Negative       Past Medical History: Patient Active Problem List   Diagnosis Date Noted  . Adverse food reaction 10/23/2018  . Mild intermittent asthma without complication [id number]  . Other allergic rhinitis 10/23/2018  . PVC (premature ventricular contraction) 03/21/2018  . Decreased estrogen level 04/04/2017  . Menopausal  problem 04/04/2017  . Menopausal syndrome 04/04/2017  . Osteopenia 04/04/2017  . Vaginal irritation 04/04/2017   Past Medical History:  Diagnosis Date  . Allergy   . Arthritis   . Cancer [facility]) 1978   cervical- had hysterectomy  . GERD (gastroesophageal reflux disease)   . History of IBS   . Irregular heart rhythm    occasional PAC'S   Past Surgical History: Past Surgical History:  Procedure Laterality Date  . ABDOMINAL HYSTERECTOMY     both ovaries removed  . BUNIONECTOMY     both feet  . CATARACT EXTRACTION     right  . COLON SURGERY  1988   part of colon removed d/t endometriosis  . COLONOSCOPY  05/2017   One benign polyp  . CORRECTION HAMMER TOE  03/2017   Medication List:  Current Outpatient Medications  Medication Sig Dispense Refill  . albuterol (PROAIR HFA) 108 (90 Base) MCG/ACT inhaler Take 2 puffs by mouth 2 (two) times daily as needed.    Marland Kitchen CRANBERRY PO Take 1 tablet by mouth daily.    Marland Kitchen desonide (DESOWEN) 0.05 % cream desonide 0.05 % topical cream prn for dandruff    . fluticasone (FLONASE) 50 MCG/ACT nasal spray Place into both nostrils as needed for allergies or rhinitis.    . Ibuprofen (ADVIL PO) Take 200 mg by mouth as needed.     Marland Kitchen levocetirizine (XYZAL) 5 MG tablet Take 5 mg by mouth as needed for allergies.    . Metoprolol Succinate 25 MG CS24 Take 1 tablet by mouth daily.     . NON FORMULARY Take 1 tablet by mouth daily. Estreven with Melatonin    .  Omeprazole (PRILOSEC PO) Take 20 mg by mouth daily.     . Polyethylene Glycol 400 (BLINK TEARS OP) Apply to eye as needed.    . prednisoLONE acetate (PRED MILD) 0.12 % ophthalmic suspension Place 1 drop into the left eye as needed.    . Probiotic Product (PROBIOTIC PO) Take 1 tablet by mouth daily.    Marland Kitchen triamcinolone cream (KENALOG) 0.1 % APPLY BID X 14 DAYS; pt takes as needed  1  . Azelastine HCl 0.15 % SOLN 1-2 sprays per nostril 1-2 times a day as needed for runny nose. 30 mL 5   No current  facility-administered medications for this visit.    Allergies: Allergies  Allergen Reactions  . Azithromycin Rash and Other (See Comments)    Pt stated, "Lips turn numb and itching" Numbness around lips Numbness around lips  . Erythromycin Base     Rash and vomiting  . Erythromycin Nausea And Vomiting, Rash and Other (See Comments)    Vomiting; rash  . Tape     Caused redness  . Tessalon [Benzonatate] Itching and Rash   Social History: Social History   Socioeconomic History  . Marital status: Widowed    Spouse name: Not on file  . Number of children: 0  . Years of education: Not on file  . Highest education level: Not on file  Occupational History  . Not on file  Social Needs  . Financial resource strain: Not on file  . Food insecurity    Worry: Not on file    Inability: Not on file  . Transportation needs    Medical: Not on file    Non-medical: Not on file  Tobacco Use  . Smoking status: Former Smoker    Packs/day: 1.00    Types: Cigarettes    Quit date: 1977    Years since quitting: 43.7  . Smokeless tobacco: Never Used  Substance and Sexual Activity  . Alcohol use: Yes    Comment: occasional  . Drug use: No  . Sexual activity: Not on file  Lifestyle  . Physical activity    Days per week: Not on file    Minutes per session: Not on file  . Stress: Not on file  Relationships  . Social Herbalist on phone: Not on file    Gets together: Not on file    Attends religious service: Not on file    Active member of club or organization: Not on file    Attends meetings of clubs or organizations: Not on file    Relationship status: Not on file  Other Topics Concern  . Not on file  Social History Narrative  . Not on file   Lives in a 66 year old home. Smoking: quit in 1975 Occupation: retired  Programme researcher, broadcasting/film/video History: Water Damage/mildew in the house: yes in [place] in the family room: no Carpet in the bedroom: no Heating: electric  Cooling: central Pet: yes 5 cats  Family History: Family History  Problem Relation Age of Onset  . Colon cancer Maternal Grandmother   . Heart disease Mother   . Brain cancer Father   . Heart disease Father   . Lung cancer Brother   . Heart disease Sister   . Esophageal cancer Neg Hx   . Stomach cancer Neg Hx   . Rectal cancer Neg Hx    Problem  Relation Asthma                                   Sister  Eczema                                No  Food allergy                          Sister, brother  Allergic rhino conjunctivitis     Siblings   Review of Systems  Constitutional: Negative for appetite change, chills, fever and unexpected weight change.  HENT: Negative for congestion and rhinorrhea.   Eyes: Negative for itching.  Respiratory: Negative for cough, chest tightness, shortness of breath and wheezing.   Cardiovascular: Negative for chest pain.  Gastrointestinal: Positive for abdominal pain and diarrhea.  Genitourinary: Negative for difficulty urinating.  Skin: Negative for rash.  Allergic/Immunologic: Positive for environmental allergies.  Neurological: Negative for headaches.   Objective: BP 100/70   Pulse 76   Temp 97.9 F (36.6 C) (Temporal)   Resp 16   Ht 5\' 2"  (1.575 m)   Wt 172 lb 9.6 oz (78.3 kg)   SpO2 97%   BMI 31.57 kg/m  Body mass index is 31.57 kg/m. Physical Exam  Constitutional: She is oriented to person, place, and time. She appears well-developed and well-nourished.  HENT:  Head: Normocephalic and atraumatic.  Right Ear: External ear normal.  Left Ear: External ear normal.  Nose: Nose normal.  Mouth/Throat: Oropharynx is clear and moist.  Eyes: Conjunctivae and EOM are normal.  Neck: Neck supple.  Cardiovascular: Normal rate, regular rhythm and normal heart sounds. Exam reveals no gallop and no friction rub.  No murmur heard. Pulmonary/Chest: Effort normal and breath sounds normal. She has no wheezes. She  has no rales.  Abdominal: Soft.  Neurological: She is alert and oriented to person, place, and time.  Skin: Skin is warm. No rash noted.  Psychiatric: She has a normal mood and affect. Her behavior is normal.  Nursing note and vitals reviewed.  The plan was reviewed with the patient/family, and all questions/concerned were addressed.  It was my pleasure to see Kara Ramos today and participate in her care. Please feel free to contact me with any questions or concerns.  Sincerely,  Rexene Alberts, DO Allergy & Immunology  [facility] office: [phone] [facility] office: [place] office: [phone]

## 2018-10-23 NOTE — Assessment & Plan Note (Signed)
Perennial rhinitis symptoms since teenage years.  Currently using Flonase and Xyzal with some benefit.  Last skin testing was over 50 years ago which showed multiple positives per patient report.  Start azelastine nasal spray 1-2 sprays per nostril 1-2 times a day as needed for drainage.  May use over the counter antihistamines such as Zyrtec (cetirizine), Claritin (loratadine), Allegra (fexofenadine), or Xyzal (levocetirizine) daily as needed.  May use Flonase 1 spray 1-2 times a day as needed for nasal congestion.   If symptoms worsen then consider environmental allergy testing in future.

## 2018-10-23 NOTE — Patient Instructions (Addendum)
Today's skin testing showed: Borderline positive to wheat, shellfish mix, trout, oat, rye, onion and pineapple.   Avoid above foods. Keep food journal/diary. For mild symptoms you can take over the counter antihistamines such as Benadryl and monitor symptoms closely. If symptoms worsen or if you have severe symptoms including breathing issues, throat closure, significant swelling, whole body hives, severe diarrhea and vomiting, lightheadedness then seek immediate medical care.  Start azelastine nasal spray 1-2 sprays per nostril 1-2 times a day as needed for drainage. May use over the counter antihistamines such as Zyrtec (cetirizine), Claritin (loratadine), Allegra (fexofenadine), or Xyzal (levocetirizine) daily as needed. May use Flonase 1 spray 1-2 times a day as needed for nasal congestion.   May use albuterol rescue inhaler 2 puffs or nebulizer every 4 to 6 hours as needed for shortness of breath, chest tightness, coughing, and wheezing. May use albuterol rescue inhaler 2 puffs 5 to 15 minutes prior to strenuous physical activities. Monitor frequency of use.   Follow up in 2 months

## 2018-10-23 NOTE — Assessment & Plan Note (Signed)
Used to have asthma as a teenager now only flares with URIs.   Today's spirometry was normal.  May use albuterol rescue inhaler 2 puffs or nebulizer every 4 to 6 hours as needed for shortness of breath, chest tightness, coughing, and wheezing. May use albuterol rescue inhaler 2 puffs 5 to 15 minutes prior to strenuous physical activities. Monitor frequency of use.

## 2018-10-24 DIAGNOSIS — I952 Hypotension due to drugs: Secondary | ICD-10-CM | POA: Diagnosis not present

## 2018-10-24 DIAGNOSIS — Z0000 Encounter for general adult medical examination without abnormal findings: Secondary | ICD-10-CM | POA: Diagnosis not present

## 2018-10-24 DIAGNOSIS — Z23 Encounter for immunization: Secondary | ICD-10-CM | POA: Diagnosis not present

## 2018-10-24 DIAGNOSIS — F5101 Primary insomnia: Secondary | ICD-10-CM | POA: Diagnosis not present

## 2018-10-24 DIAGNOSIS — N390 Urinary tract infection, site not specified: Secondary | ICD-10-CM | POA: Diagnosis not present

## 2018-10-28 ENCOUNTER — Ambulatory Visit
Admission: RE | Admit: 2018-10-28 | Discharge: 2018-10-28 | Disposition: A | Discharge status: Home / Self Care | Payer: Medicare Other | Source: Ambulatory Visit | Attending: Obstetrics and Gynecology | Admitting: Obstetrics and Gynecology

## 2018-10-28 ENCOUNTER — Other Ambulatory Visit: Payer: Self-pay

## 2018-10-28 ENCOUNTER — Other Ambulatory Visit: Payer: Self-pay | Admitting: Obstetrics and Gynecology

## 2018-10-28 DIAGNOSIS — R922 Inconclusive mammogram: Secondary | ICD-10-CM | POA: Diagnosis not present

## 2018-10-28 DIAGNOSIS — R921 Mammographic calcification found on diagnostic imaging of breast: Secondary | ICD-10-CM

## 2018-10-29 ENCOUNTER — Ambulatory Visit
Admission: RE | Admit: 2018-10-29 | Discharge: 2018-10-29 | Disposition: A | Discharge status: Home / Self Care | Payer: Medicare Other | Source: Ambulatory Visit | Attending: Obstetrics and Gynecology | Admitting: Obstetrics and Gynecology

## 2018-10-29 ENCOUNTER — Other Ambulatory Visit: Payer: Self-pay | Admitting: Diagnostic Radiology

## 2018-10-29 DIAGNOSIS — D0511 Intraductal carcinoma in situ of right breast: Secondary | ICD-10-CM | POA: Diagnosis not present

## 2018-10-29 DIAGNOSIS — R921 Mammographic calcification found on diagnostic imaging of breast: Secondary | ICD-10-CM

## 2018-10-29 DIAGNOSIS — R920 Mammographic microcalcification found on diagnostic imaging of breast: Secondary | ICD-10-CM | POA: Diagnosis not present

## 2018-11-01 ENCOUNTER — Encounter: Payer: Self-pay | Admitting: *Deleted

## 2018-11-01 DIAGNOSIS — D0511 Intraductal carcinoma in situ of right breast: Secondary | ICD-10-CM | POA: Insufficient documentation

## 2018-11-03 DIAGNOSIS — R300 Dysuria: Secondary | ICD-10-CM | POA: Diagnosis not present

## 2018-11-03 DIAGNOSIS — Z87440 Personal history of urinary (tract) infections: Secondary | ICD-10-CM | POA: Diagnosis not present

## 2018-11-04 DIAGNOSIS — R300 Dysuria: Secondary | ICD-10-CM | POA: Diagnosis not present

## 2018-11-05 NOTE — Progress Notes (Signed)
[facility] NOTE  Patient Care Team: Vernie Shanks, MD as PCP - General (Family Medicine) Mauro Kaufmann, RN as Oncology Nurse Navigator Rockwell Germany, RN as Oncology Nurse Navigator Nicholas Lose, MD as Consulting Physician (Hematology and Oncology) Kyung Rudd, MD as Consulting Physician (Radiation Oncology) Stark Klein, MD as Consulting Physician (General Surgery)  CHIEF COMPLAINTS/PURPOSE OF CONSULTATION:  Newly diagnosed breast cancer  HISTORY OF PRESENTING ILLNESS:  Kara Ramos 68 y.o. female is here because of recent diagnosis of DCIS of the right breast. The cancer was detected on a routine screening mammogram. Diagnostic mammogram of the right breast on 10/28/18 showed calcifications in the lower inner quadrant spanning 1.6cm. Biopsy on 10/29/18 showed intermediate grade DCIS with calcifications, ER+ 95%, PR+ 60%. She presents to the clinic today for initial evaluation and discussion of treatment options.   I reviewed her records extensively and collaborated the history with the patient.  SUMMARY OF ONCOLOGIC HISTORY: Oncology History  Ductal carcinoma in situ (DCIS) of right breast  11/01/2018 Initial Diagnosis   Routine screening mammogram detected calcifications in the LIQ spanning 1.6cm. 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      MEDICAL HISTORY:  Past Medical History:  Diagnosis Date  . Allergy   . Arthritis   . Cancer [facility]) 1978   cervical- had hysterectomy  . GERD (gastroesophageal reflux disease)   . History of IBS   . Irregular heart rhythm    occasional PAC'S    SURGICAL HISTORY: Past Surgical History:  Procedure Laterality Date  . ABDOMINAL HYSTERECTOMY     both ovaries removed  . BUNIONECTOMY     both feet  . CATARACT EXTRACTION     right  . COLON SURGERY  1988   part of colon removed d/t endometriosis  . COLONOSCOPY  05/2017   One benign polyp  . CORRECTION HAMMER TOE   03/2017    SOCIAL HISTORY: Social History   Socioeconomic History  . Marital status: Widowed    Spouse name: Not on file  . Number of children: 0  . Years of education: Not on file  . Highest education level: Not on file  Occupational History  . Not on file  Social Needs  . Financial resource strain: Not on file  . Food insecurity    Worry: Not on file    Inability: Not on file  . Transportation needs    Medical: Not on file    Non-medical: Not on file  Tobacco Use  . Smoking status: Former Smoker    Packs/day: 1.00    Types: Cigarettes    Quit date: 1977    Years since quitting: 43.7  . Smokeless tobacco: Never Used  Substance and Sexual Activity  . Alcohol use: Yes    Comment: occasional  . Drug use: No  . Sexual activity: Not on file  Lifestyle  . Physical activity    Days per week: Not on file    Minutes per session: Not on file  . Stress: Not on file  Relationships  . Social Herbalist on phone: Not on file    Gets together: Not on file    Attends religious service: Not on file    Active member of club or organization: Not on file    Attends meetings of clubs or organizations: Not on file    Relationship status: Not on file  . Intimate partner violence  Fear of current or ex partner: Not on file    Emotionally abused: Not on file    Physically abused: Not on file    Forced sexual activity: Not on file  Other Topics Concern  . Not on file  Social History Narrative  . Not on file    FAMILY HISTORY: Family History  Problem Relation Age of Onset  . Colon cancer Maternal Grandmother   . Heart disease Mother   . Brain cancer Father   . Heart disease Father   . Lung cancer Brother   . Heart disease Sister   . Cancer Paternal Grandmother   . Esophageal cancer Neg Hx   . Stomach cancer Neg Hx   . Rectal cancer Neg Hx     ALLERGIES:  is allergic to azithromycin; erythromycin base; erythromycin; tape; and tessalon  [benzonatate].  MEDICATIONS:  Current Outpatient Medications  Medication Sig Dispense Refill  . albuterol (PROAIR HFA) 108 (90 Base) MCG/ACT inhaler Take 2 puffs by mouth 2 (two) times daily as needed.    . Azelastine HCl 0.15 % SOLN 1-2 sprays per nostril 1-2 times a day as needed for runny nose. 30 mL 5  . cephALEXin (KEFLEX) 500 MG capsule Take 500 mg by mouth 2 (two) times daily.    Marland Kitchen CRANBERRY PO Take 1 tablet by mouth daily.    Marland Kitchen desonide (DESOWEN) 0.05 % cream desonide 0.05 % topical cream prn for dandruff    . fluticasone (FLONASE) 50 MCG/ACT nasal spray Place into both nostrils as needed for allergies or rhinitis.    . Ibuprofen (ADVIL PO) Take 200 mg by mouth as needed.     Marland Kitchen levocetirizine (XYZAL) 5 MG tablet Take 5 mg by mouth as needed for allergies.    . Metoprolol Succinate 25 MG CS24 Take 1 tablet by mouth daily.     . NON FORMULARY Take 1 tablet by mouth daily. Estreven with Melatonin    . Omeprazole (PRILOSEC PO) Take 20 mg by mouth daily.     . Polyethylene Glycol 400 (BLINK TEARS OP) Apply to eye as needed.    . Probiotic Product (PROBIOTIC PO) Take 1 tablet by mouth daily.    Marland Kitchen triamcinolone cream (KENALOG) 0.1 % APPLY BID X 14 DAYS; pt takes as needed  1  . prednisoLONE acetate (PRED MILD) 0.12 % ophthalmic suspension Place 1 drop into the left eye as needed.     No current facility-administered medications for this visit.     REVIEW OF SYSTEMS:   Constitutional: Denies fevers, chills or abnormal night sweats Eyes: Denies blurriness of vision, double vision or watery eyes Ears, nose, mouth, throat, and face: Denies mucositis or sore throat Respiratory: Denies cough, dyspnea or wheezes Cardiovascular: Denies palpitation, chest discomfort or lower extremity swelling Gastrointestinal:  Denies nausea, heartburn or change in bowel habits Skin: Denies abnormal skin rashes Lymphatics: Denies new lymphadenopathy or easy bruising Neurological:Denies numbness, tingling or  new weaknesses Behavioral/Psych: Mood is stable, no new changes  Breast: Denies any palpable lumps or discharge All other systems were reviewed with the patient and are negative.  PHYSICAL EXAMINATION: ECOG PERFORMANCE STATUS: 1 - Symptomatic but completely ambulatory  Vitals:   11/06/18 0846  BP: (!) 117/58  Pulse: (!) 58  Resp: 17  Temp: 98 F (36.7 C)  SpO2: 98%   Filed Weights   11/06/18 0846  Weight: 170 lb 12.8 oz (77.5 kg)    GENERAL:alert, no distress and comfortable SKIN: skin color, texture, turgor  are normal, no rashes or significant lesions EYES: normal, conjunctiva are pink and non-injected, sclera clear OROPHARYNX:no exudate, no erythema and lips, buccal mucosa, and tongue normal  NECK: supple, thyroid normal size, non-tender, without nodularity LYMPH:  no palpable lymphadenopathy in the cervical, axillary or inguinal LUNGS: clear to auscultation and percussion with normal breathing effort HEART: regular rate & rhythm and no murmurs and no lower extremity edema ABDOMEN:abdomen soft, non-tender and normal bowel sounds Musculoskeletal:no cyanosis of digits and no clubbing  PSYCH: alert & oriented x 3 with fluent speech NEURO: no focal motor/sensory deficits BREAST: No palpable nodules in breast. No palpable axillary or supraclavicular lymphadenopathy (exam performed in the presence of a chaperone)   LABORATORY DATA:  I have reviewed the data as listed Lab Results  Component Value Date   WBC 6.0 11/06/2018   HGB 13.3 11/06/2018   HCT 40.0 11/06/2018   MCV 87.9 11/06/2018   PLT 243 11/06/2018   Lab Results  Component Value Date   NA 139 11/06/2018   K 4.1 11/06/2018   CL 104 11/06/2018   CO2 25 11/06/2018    RADIOGRAPHIC STUDIES: I have personally reviewed the radiological reports and agreed with the findings in the report.  ASSESSMENT AND PLAN:  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: Routine screening mammogram detected  calcifications in the LIQ spanning 1.6cm. 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  Tis NX stage 0  Pathology review: I discussed with the patient the difference between DCIS and invasive breast cancer. It is considered a precancerous lesion. DCIS is classified as a 0. It is generally detected through mammograms as calcifications. We discussed the significance of grades and its impact on prognosis. We also discussed the importance of ER and PR receptors and their implications to adjuvant treatment options. Prognosis of DCIS dependence on grade, comedo necrosis. It is anticipated that if not treated, 20-30% of DCIS can develop into invasive breast cancer.  We discussed possible participation in clinical trial called COMET AFT 25 COMET Phase 3 clinical trial for low risk DCIS grade 1/2 ER positive, age greater than 32 randomized to surgery +/- radiation, +/- endocrine therapy versus active surveillance with +/- endocrine therapy surveillance with mammograms every 6 months for 5 years   Recommendation: 1. Breast conserving surgery 2. Followed by adjuvant radiation therapy 3. Followed by antiestrogen therapy with tamoxifen 5 years  Tamoxifen counseling: We discussed the risks and benefits of tamoxifen. These include but not limited to insomnia, hot flashes, mood changes, vaginal dryness, and weight gain. Although rare, serious side effects including endometrial cancer, risk of blood clots were also discussed. We strongly believe that the benefits far outweigh the risks. Patient understands these risks and consented to starting treatment. Planned treatment duration is 5 years.  Patient met with our research staff and we provided her with all the information.  She will make a decision on the clinical trial and inform us.  All questions were answered. The patient knows to call the clinic with any problems, questions or concerns.   Rulon Eisenmenger, MD 11/06/2018     I, Molly Dorshimer, am acting as scribe for Nicholas Lose, MD.  I have reviewed the above documentation for accuracy and completeness, and I agree with the above.

## 2018-11-06 ENCOUNTER — Encounter: Payer: Self-pay | Admitting: Hematology and Oncology

## 2018-11-06 ENCOUNTER — Inpatient Hospital Stay: Payer: Medicare Other

## 2018-11-06 ENCOUNTER — Ambulatory Visit: Payer: Medicare Other | Admitting: Physical Therapy

## 2018-11-06 ENCOUNTER — Other Ambulatory Visit: Payer: Self-pay

## 2018-11-06 ENCOUNTER — Inpatient Hospital Stay: Payer: Medicare Other | Attending: Hematology and Oncology | Admitting: Hematology and Oncology

## 2018-11-06 ENCOUNTER — Encounter: Payer: Self-pay | Admitting: *Deleted

## 2018-11-06 ENCOUNTER — Ambulatory Visit
Admission: RE | Admit: 2018-11-06 | Discharge: 2018-11-06 | Disposition: A | Discharge status: Home / Self Care | Payer: Medicare Other | Source: Ambulatory Visit | Attending: Radiation Oncology | Admitting: Radiation Oncology

## 2018-11-06 ENCOUNTER — Other Ambulatory Visit: Payer: Self-pay | Admitting: General Surgery

## 2018-11-06 DIAGNOSIS — Z90710 Acquired absence of both cervix and uterus: Secondary | ICD-10-CM | POA: Insufficient documentation

## 2018-11-06 DIAGNOSIS — D0511 Intraductal carcinoma in situ of right breast: Secondary | ICD-10-CM

## 2018-11-06 DIAGNOSIS — Z808 Family history of malignant neoplasm of other organs or systems: Secondary | ICD-10-CM

## 2018-11-06 DIAGNOSIS — C50311 Malignant neoplasm of lower-inner quadrant of right female breast: Secondary | ICD-10-CM | POA: Diagnosis not present

## 2018-11-06 DIAGNOSIS — F17210 Nicotine dependence, cigarettes, uncomplicated: Secondary | ICD-10-CM

## 2018-11-06 DIAGNOSIS — Z79899 Other long term (current) drug therapy: Secondary | ICD-10-CM | POA: Diagnosis not present

## 2018-11-06 DIAGNOSIS — Z170 Estrogen receptor positive status [ER+]: Secondary | ICD-10-CM | POA: Diagnosis not present

## 2018-11-06 DIAGNOSIS — Z801 Family history of malignant neoplasm of trachea, bronchus and lung: Secondary | ICD-10-CM | POA: Diagnosis not present

## 2018-11-06 DIAGNOSIS — Z8541 Personal history of malignant neoplasm of cervix uteri: Secondary | ICD-10-CM | POA: Insufficient documentation

## 2018-11-06 DIAGNOSIS — Z87891 Personal history of nicotine dependence: Secondary | ICD-10-CM | POA: Insufficient documentation

## 2018-11-06 DIAGNOSIS — Z90722 Acquired absence of ovaries, bilateral: Secondary | ICD-10-CM | POA: Insufficient documentation

## 2018-11-06 DIAGNOSIS — Z79810 Long term (current) use of selective estrogen receptor modulators (SERMs): Secondary | ICD-10-CM | POA: Diagnosis not present

## 2018-11-06 DIAGNOSIS — K589 Irritable bowel syndrome without diarrhea: Secondary | ICD-10-CM | POA: Diagnosis not present

## 2018-11-06 DIAGNOSIS — Z809 Family history of malignant neoplasm, unspecified: Secondary | ICD-10-CM

## 2018-11-06 DIAGNOSIS — Z8249 Family history of ischemic heart disease and other diseases of the circulatory system: Secondary | ICD-10-CM | POA: Diagnosis not present

## 2018-11-06 DIAGNOSIS — Z800 Family history of malignant neoplasm of digestive organs: Secondary | ICD-10-CM | POA: Diagnosis not present

## 2018-11-06 DIAGNOSIS — Z791 Long term (current) use of non-steroidal anti-inflammatories (NSAID): Secondary | ICD-10-CM | POA: Diagnosis not present

## 2018-11-06 LAB — CBC WITH DIFFERENTIAL (CANCER CENTER ONLY)
Abs Immature Granulocytes: 0.03 10*3/uL (ref 0.00–0.07)
Basophils Absolute: 0 10*3/uL (ref 0.0–0.1)
Basophils Relative: 1 %
Eosinophils Absolute: 0.1 10*3/uL (ref 0.0–0.5)
Eosinophils Relative: 2 %
HCT: 40 % (ref 36.0–46.0)
Hemoglobin: 13.3 g/dL (ref 12.0–15.0)
Immature Granulocytes: 1 %
Lymphocytes Relative: 32 %
Lymphs Abs: 1.9 10*3/uL (ref 0.7–4.0)
MCH: 29.2 pg (ref 26.0–34.0)
MCHC: 33.3 g/dL (ref 30.0–36.0)
MCV: 87.9 fL (ref 80.0–100.0)
Monocytes Absolute: 0.3 10*3/uL (ref 0.1–1.0)
Monocytes Relative: 6 %
Neutro Abs: 3.6 10*3/uL (ref 1.7–7.7)
Neutrophils Relative %: 58 %
Platelet Count: 243 10*3/uL (ref 150–400)
RBC: 4.55 MIL/uL (ref 3.87–5.11)
RDW: 13 % (ref 11.5–15.5)
WBC Count: 6 10*3/uL (ref 4.0–10.5)
nRBC: 0 % (ref 0.0–0.2)

## 2018-11-06 LAB — CMP (CANCER CENTER ONLY)
ALT: 13 U/L (ref 0–44)
AST: 17 U/L (ref 15–41)
Albumin: 4.2 g/dL (ref 3.5–5.0)
Alkaline Phosphatase: 81 U/L (ref 38–126)
Anion gap: 10 (ref 5–15)
BUN: 18 mg/dL (ref 8–23)
CO2: 25 mmol/L (ref 22–32)
Calcium: 9.4 mg/dL (ref 8.9–10.3)
Chloride: 104 mmol/L (ref 98–111)
Creatinine: 0.91 mg/dL (ref 0.44–1.00)
GFR, Est AFR Am: >60 mL/min (ref >60)
GFR, Estimated: >60 mL/min (ref >60)
Glucose, Bld: 77 mg/dL (ref 70–99)
Potassium: 4.1 mmol/L (ref 3.5–5.1)
Sodium: 139 mmol/L (ref 135–145)
Total Bilirubin: 0.6 mg/dL (ref 0.3–1.2)
Total Protein: 6.6 g/dL (ref 6.5–8.1)

## 2018-11-06 NOTE — Progress Notes (Signed)
Radiation Oncology         [phone]) [phone] ________________________________  Name: Kara Ramos        MRN: [id number]  Date of Service: 11/06/2018 DOB: 28-Sep-1950  [id number], Edwyna Shell, MD  Stark Klein, MD     REFERRING PHYSICIAN: Stark Klein, MD   DIAGNOSIS: The encounter diagnosis was Ductal carcinoma in situ (DCIS) of right breast.   HISTORY OF PRESENT ILLNESS: Kara Ramos is a 68 y.o. female seen in the multidisciplinary breast clinic for a new diagnosis of right breast cancer. The patient was noted to have screening detected calcifications in the medial right breast at about 3:00. The calcifications measured 1.6 x 1.4 x .7 cm, and the axilla was not evaluated by ultrasound. She underwent stereotactic biopsy on 10/29/2018 which revealed an intermediate grade DCIS that was ER/PR positive. She is seen today to discuss treatment recommendations.    PREVIOUS RADIATION THERAPY: No   PAST MEDICAL HISTORY:  Past Medical History:  Diagnosis Date   Allergy    Arthritis    Cancer ([place]) 1978   cervical- had hysterectomy   GERD (gastroesophageal reflux disease)    History of IBS    Irregular heart rhythm    occasional PAC'S       PAST SURGICAL HISTORY: Past Surgical History:  Procedure Laterality Date   ABDOMINAL HYSTERECTOMY     both ovaries removed   BUNIONECTOMY     both feet   CATARACT EXTRACTION     right   COLON SURGERY  1988   part of colon removed d/t endometriosis   COLONOSCOPY  05/2017   One benign polyp   CORRECTION HAMMER TOE  03/2017     FAMILY HISTORY:  Family History  Problem Relation Age of Onset   Colon cancer Maternal Grandmother    Heart disease Mother    Brain cancer Father    Heart disease Father    Lung cancer Brother    Heart disease Sister    Esophageal cancer Neg Hx    Stomach cancer Neg Hx    Rectal cancer Neg Hx      SOCIAL HISTORY:  reports that she quit smoking about 43 years ago. Her smoking  use included cigarettes. She smoked 1.00 pack per day. She has never used smokeless tobacco. She reports current alcohol use. She reports that she does not use drugs. The patient is widowed and lives in [place]. Her friend Chrys Racer joins [place] on speakerphone.  ALLERGIES: Azithromycin, Erythromycin base, Erythromycin, Tape, and Tessalon [benzonatate]   MEDICATIONS:  Current Outpatient Medications  Medication Sig Dispense Refill   albuterol (PROAIR HFA) 108 (90 Base) MCG/ACT inhaler Take 2 puffs by mouth 2 (two) times daily as needed.     Azelastine HCl 0.15 % SOLN 1-2 sprays per nostril 1-2 times a day as needed for runny nose. 30 mL 5   CRANBERRY PO Take 1 tablet by mouth daily.     desonide (DESOWEN) 0.05 % cream desonide 0.05 % topical cream prn for dandruff     fluticasone (FLONASE) 50 MCG/ACT nasal spray Place into both nostrils as needed for allergies or rhinitis.     Ibuprofen (ADVIL PO) Take 200 mg by mouth as needed.      levocetirizine (XYZAL) 5 MG tablet Take 5 mg by mouth as needed for allergies.     Metoprolol Succinate 25 MG CS24 Take 1 tablet by mouth daily.      NON FORMULARY Take 1 tablet by mouth daily.  Estreven with Melatonin     Omeprazole (PRILOSEC PO) Take 20 mg by mouth daily.      Polyethylene Glycol 400 (BLINK TEARS OP) Apply to eye as needed.     prednisoLONE acetate (PRED MILD) 0.12 % ophthalmic suspension Place 1 drop into the left eye as needed.     Probiotic Product (PROBIOTIC PO) Take 1 tablet by mouth daily.     triamcinolone cream (KENALOG) 0.1 % APPLY BID X 14 DAYS; pt takes as needed  1   No current facility-administered medications for this encounter.      REVIEW OF SYSTEMS: On review of systems, the patient reports that she is doing well overall. She denies any chest pain, shortness of breath, cough, fevers, chills, night sweats, unintended weight changes. She denies any bowel or bladder disturbances, and denies abdominal pain, nausea or  vomiting. She denies any new musculoskeletal or joint aches or pains. A complete review of systems is obtained and is otherwise negative.     PHYSICAL EXAM:  Wt Readings from Last 3 Encounters:  10/23/18 172 lb 9.6 oz (78.3 kg)  03/21/18 172 lb 3.2 oz (78.1 kg)  03/22/17 177 lb 5 oz (80.4 kg)   Temp Readings from Last 3 Encounters:  10/23/18 97.9 F (36.6 C) (Temporal)  07/03/17 98.6 F (37 C) (Other (Comment))  04/04/17 98.4 F (36.9 C)   BP Readings from Last 3 Encounters:  10/23/18 100/70  03/21/18 113/68  03/22/17 130/71   Pulse Readings from Last 3 Encounters:  10/23/18 76  03/21/18 80  03/22/17 62   In general this is a well appearing caucasian female in no acute distress. She's alert and oriented x4 and appropriate throughout the examination. Cardiopulmonary assessment is negative for acute distress and she exhibits normal effort. Breast exam is deferred.   ECOG = 0  0 - Asymptomatic (Fully active, able to carry on all predisease activities without restriction)  1 - Symptomatic but completely ambulatory (Restricted in physically strenuous activity but ambulatory and able to carry out work of a light or sedentary nature. For example, light housework, office work)  2 - Symptomatic, <50% in bed during the day (Ambulatory and capable of all self care but unable to carry out any work activities. Up and about more than 50% of waking hours)  3 - Symptomatic, >50% in bed, but not bedbound (Capable of only limited self-care, confined to bed or chair 50% or more of waking hours)  4 - Bedbound (Completely disabled. Cannot carry on any self-care. Totally confined to bed or chair)  5 - Death   Eustace Pen MM, Creech RH, Tormey DC, et al. [phone]). "Toxicity and response criteria of the [facility]". [place] Oncol. 5 (6): 649-55    LABORATORY DATA:  Lab Results  Component Value Date   WBC 5.8 04/17/2007   HGB 13.0 04/17/2007   HCT 38.5 04/17/2007    MCV 88.8 04/17/2007   PLT 245 04/17/2007   Lab Results  Component Value Date   NA 140 04/17/2007   K 4.0 04/17/2007   CL 103 04/17/2007   CO2 29 04/17/2007   Lab Results  Component Value Date   ALT 15 04/17/2007   AST 17 04/17/2007   ALKPHOS 63 04/17/2007   BILITOT 0.5 04/17/2007      RADIOGRAPHY: Mm Digital Diagnostic Unilat R  Addendum Date: 10/28/2018   ADDENDUM REPORT: 10/28/2018 15:14 ADDENDUM: The stereotactic tomosynthesis core needle biopsy procedure has been scheduled for tomorrow, September  15 at 9:30 a.m. Electronically Signed   By: Evangeline Dakin M.D.   On: 10/28/2018 15:14   Result Date: 10/28/2018 CLINICAL DATA:  Recall from screening mammography with tomosynthesis, calcifications involving the slight LOWER INNER QUADRANT of the RIGHT breast at ANTERIOR depth. EXAM: DIGITAL DIAGNOSTIC RIGHT MAMMOGRAM WITH CAD COMPARISON:  Previous exam(s). ACR Breast Density Category c: The breast tissue is heterogeneously dense, which may obscure small masses. FINDINGS: Standard spot magnification CC and mediolateral views of the RIGHT breast calcifications and a standard 2D full field mediolateral view of the RIGHT breast were obtained. Spot magnification views confirm a group of fine heterogeneous calcifications in the slight LOWER INNER QUADRANT which span approximately 0.7 x 1.4 x 1.6 cm and which are predominantly punctate, though there are scattered linear forms within the group. There is no associated mass or architectural distortion. The full field mediolateral image was processed with CAD. IMPRESSION: Indeterminate approximate 1.6 cm group of calcifications involving the slight LOWER INNER QUADRANT of the RIGHT breast. RECOMMENDATION: Stereotactic tomosynthesis core needle biopsy of the RIGHT breast calcifications. The stereotactic tomosynthesis core needle biopsy procedure was discussed with the patient and her questions were answered. She wishes to proceed and the biopsy has been  scheduled for blank. I have discussed the findings and recommendations with the patient. If applicable, a reminder letter will be sent to the patient regarding the next appointment. BI-RADS CATEGORY  4: Suspicious. Electronically Signed: By: Evangeline Dakin M.D. On: 10/28/2018 14:46   Mm 3d Screen Breast Bilateral  Result Date: 10/17/2018 CLINICAL DATA:  Screening. EXAM: DIGITAL SCREENING BILATERAL MAMMOGRAM WITH TOMO AND CAD COMPARISON:  Previous exam(s). ACR Breast Density Category c: The breast tissue is heterogeneously dense, which may obscure small masses. FINDINGS: In the right breast, questioned calcifications warrant further evaluation with magnified views. In the left breast, no findings suspicious for malignancy. Images were processed with CAD. IMPRESSION: Further evaluation is suggested for calcifications in the right breast. RECOMMENDATION: Diagnostic mammogram of the right breast. (Code:[id number]) The patient will be contacted regarding the findings, and additional imaging will be scheduled. BI-RADS CATEGORY  0: Incomplete. Need additional imaging evaluation and/or prior mammograms for comparison. Electronically Signed   By: Zerita Boers M.D.   On: 10/17/2018 13:52   Mm Clip Placement Right  Result Date: 10/29/2018 CLINICAL DATA:  Here for biopsy of grouped microcalcifications in the right breast. EXAM: DIAGNOSTIC RIGHT MAMMOGRAM POST STEREOTACTIC BIOPSY COMPARISON:  Previous exam(s). FINDINGS: Mammographic images were obtained following stereotactic guided biopsy of grouped microcalcifications in the lower inner right breast. A coil shaped biopsy marking clip is appropriately positioned at the site of biopsy. IMPRESSION: Coil shaped biopsy marking clip appropriately positioned at the site of biopsy in the right breast. Final Assessment: Post Procedure Mammograms for Marker Placement Electronically Signed   By: Zerita Boers M.D.   On: 10/29/2018 10:01   Mm Rt Breast Bx W Loc Dev 1st Lesion  Image Bx Spec Stereo Guide  Addendum Date: 10/30/2018   ADDENDUM REPORT: 10/30/2018 12:58 ADDENDUM: Pathology revealed INTERMEDIATE GRADE DUCTAL CARCINOMA IN SITU, CALCIFICATIONS of the Right breast, lower inner quadrant, middle depth. This was found to be concordant by Dr. Zerita Boers. Pathology results were discussed with the patient by telephone. The patient reported doing well after the biopsy with tenderness at the site. Post biopsy instructions and care were reviewed and questions were answered. The patient was encouraged to call [facility] for any additional concerns. The patient was  referred to [facility] on November 06, 2018. Pathology results reported by Terie Purser, RN on 10/30/2018. Electronically Signed   By: Zerita Boers M.D.   On: 10/30/2018 12:58   Result Date: 10/30/2018 CLINICAL DATA:  Grouped calcifications in the right breast seen on screening mammogram EXAM: RIGHT BREAST STEREOTACTIC CORE NEEDLE BIOPSY COMPARISON:  Previous exams. FINDINGS: The patient and I discussed the procedure of stereotactic-guided biopsy including benefits and alternatives. We discussed the high likelihood of a successful procedure. We discussed the risks of the procedure including infection, bleeding, tissue injury, clip migration, and inadequate sampling. Informed written consent was given. The usual time out protocol was performed immediately prior to the procedure. Using sterile technique and 1% Lidocaine as local anesthetic, under stereotactic guidance, a 9 gauge vacuum assisted device was used to perform core needle biopsy of grouped microcalcifications in the lower inner quadrant of the right breast using a medial approach. Specimen radiograph was performed showing sampling of representative calcifications. Specimens with calcifications are identified for pathology. Lesion quadrant: Lower inner quadrant  At the conclusion of the procedure, a coil shaped tissue marker clip was deployed into the biopsy cavity. Follow-up 2-view mammogram was performed and dictated separately. IMPRESSION: Stereotactic-guided biopsy of grouped microcalcifications in the right breast. No apparent complications. Electronically Signed: By: Zerita Boers M.D. On: 10/29/2018 10:00       IMPRESSION/PLAN: 1. 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 Dr. Lisbeth Renshaw discusses the pathology findings and reviews the nature of non invasive breast disease. The consensus from the breast conference includes MRI for extent of disease followed by breast conservation with lumpectomy versus COMET trial enrollment. She is leaning toward treatment with surgery. She would benefit from external radiotherapy to the breast followed by antiestrogen therapy to reduce risks of local and systemic recurrence. We discussed the risks, benefits, short, and long term effects of radiotherapy, and the patient is interested in proceeding. Dr. Lisbeth Renshaw discusses the delivery and logistics of radiotherapy and anticipates a course of 4 weeks of radiotherapy. We will see her back about 2 weeks after surgery to discuss the simulation process and anticipate we starting radiotherapy about 4-6 weeks after surgery.  2. Possible genetic predisposition to malignancy. The patient is interested in learning more about testing though she does not have a family history. Referral will be made to discuss her options of testing.  In a visit lasting 30 minutes, greater than 50% of the time was spent face to face discussing her case, and coordinating the patient's care.  The above documentation reflects my direct findings during this shared patient visit. Please see the separate note by Dr. Lisbeth Renshaw on this date for the remainder of the patient's plan of care.    Carola Rhine, PAC

## 2018-11-06 NOTE — Research (Unsigned)
AFT-025, Comet Study; Dr. Lindi Adie referred patient for the Comet Study.  Met with patient alone in exam room for 5 minutes to provide information about this study.  Gave patient copies of consent and hippa forms for this study.  Briefly explained the purpose, possible benefits and risks of study. Informed patient that participation is voluntary and explained the randomization process.  Patient states she is interested and will read the consent form at home. I also gave patient a study brochure for patients and my business card. She agreed for research nurse to follow up with her next week on Tuesday 11/12/18.  Encouraged patient to email or call if any questions prior to that time. Thanked patient for her time today and for considering this study. She verbalized understanding.  Foye Spurling, BSN, RN Clinical Research Nurse 11/06/2018 11:14 AM

## 2018-11-06 NOTE — Assessment & Plan Note (Addendum)
11/01/2018: Routine screening mammogram detected calcifications in the LIQ spanning 1.6cm. 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  Tis NX stage 0  Pathology review: I discussed with the patient the difference between DCIS and invasive breast cancer. It is considered a precancerous lesion. DCIS is classified as a 0. It is generally detected through mammograms as calcifications. We discussed the significance of grades and its impact on prognosis. We also discussed the importance of ER and PR receptors and their implications to adjuvant treatment options. Prognosis of DCIS dependence on grade, comedo necrosis. It is anticipated that if not treated, 20-30% of DCIS can develop into invasive breast cancer.  We discussed possible participation in clinical trial called COMET AFT 25 COMET Phase 3 clinical trial for low risk DCIS grade 1/2 PR positive, age greater than 54 randomized to surgery +/- radiation, +/- endocrine therapy versus active surveillance with +/- endocrine therapy surveillance with mammograms every 6 months for 5 years   Recommendation: 1. Breast conserving surgery 2. Followed by adjuvant radiation therapy 3. Followed by antiestrogen therapy with tamoxifen 5 years  Tamoxifen counseling: We discussed the risks and benefits of tamoxifen. These include but not limited to insomnia, hot flashes, mood changes, vaginal dryness, and weight gain. Although rare, serious side effects including endometrial cancer, risk of blood clots were also discussed. We strongly believe that the benefits far outweigh the risks. Patient understands these risks and consented to starting treatment. Planned treatment duration is 5 years.  Return to clinic after surgery to discuss the final pathology report and come up with an adjuvant treatment plan.

## 2018-11-12 ENCOUNTER — Telehealth: Payer: Self-pay | Admitting: *Deleted

## 2018-11-12 NOTE — Telephone Encounter (Signed)
Called patient to follow up on her interest in the Comet study for DCIS.  Patient stated she has read the consent from and given it a lot of thought. Ultimately, patient said she feels more comfortable pursuing surgery and radiation.  Patient is not sure she would feel comfortable being randomized to the Active Monitoring arm of the study.  Discussed that patient would not be a good candidate for this study since she prefers having surgery and we would not be able to decide which arm of the study she is assigned. Patient agreed and will decline to enroll on this study.  Patient asks what the next step is for her and I informed her I will send message to Dr. Lindi Adie and the Breast Navigators to arrange her next step, which is likely appointment with the surgeon to plan surgery. She verbalizes understanding.  Thanked patient for her time and considering this study.  Foye Spurling, BSN, RN Clinical Research Nurse 11/12/2018 2:08 PM

## 2018-11-12 NOTE — Telephone Encounter (Signed)
We will inform Dr. Barry Dienes as well as her surgery scheduler

## 2018-11-13 ENCOUNTER — Other Ambulatory Visit: Payer: Self-pay | Admitting: General Surgery

## 2018-11-13 ENCOUNTER — Telehealth: Payer: Self-pay

## 2018-11-13 NOTE — Telephone Encounter (Signed)
Nutrition Assessment  Reason for Assessment:  Pt attended Breast Clinic on 9/23 and was given nutrition packet of information by nurse navigator  ASSESSMENT:  68 year old female with DCIS of right breast. Planning lumpectomy, radiation and antiestrogens.    Spoke with patient via phone to introduce self and service at [facility]  Patient reports good appetite.   Medications:  reviewed  Labs: reviewed  Anthropometrics:   Height: 62 inches Weight: 170 lb 12.8 oz BMI: 31   NUTRITION DIAGNOSIS: Food and nutrition related knowledge deficit related to new diagnosis of breast cancer as evidenced by no prior need for nutrition related information.  INTERVENTION:   Discussed and briefly reviewed packet of information regarding nutritional tips for breast cancer patients.  No questions at this time.  Contact information provided and patient knows to contact me with questions/concerns.    MONITORING, EVALUATION, and GOAL: Pt will consume a healthy plant based diet to maintain lean body mass throughout treatment.   Kara Ramos B. Zenia Resides, [place], [place] Registered Dietitian [phone] (pager)

## 2018-11-14 ENCOUNTER — Telehealth: Payer: Self-pay | Admitting: *Deleted

## 2018-11-14 NOTE — Telephone Encounter (Signed)
Pt has decided she would like to move forward with standard of care and forgo the COMET trial. Physician team notified. No further questions or needs at this time.

## 2018-11-15 ENCOUNTER — Ambulatory Visit
Admission: RE | Admit: 2018-11-15 | Discharge: 2018-11-15 | Disposition: A | Discharge status: Home / Self Care | Payer: Medicare Other | Source: Ambulatory Visit | Attending: Obstetrics and Gynecology | Admitting: Obstetrics and Gynecology

## 2018-11-15 ENCOUNTER — Other Ambulatory Visit: Payer: Self-pay

## 2018-11-15 DIAGNOSIS — Z780 Asymptomatic menopausal state: Secondary | ICD-10-CM | POA: Diagnosis not present

## 2018-11-15 DIAGNOSIS — M810 Age-related osteoporosis without current pathological fracture: Secondary | ICD-10-CM

## 2018-11-15 DIAGNOSIS — M85851 Other specified disorders of bone density and structure, right thigh: Secondary | ICD-10-CM | POA: Diagnosis not present

## 2018-11-18 ENCOUNTER — Other Ambulatory Visit: Payer: Self-pay | Admitting: *Deleted

## 2018-11-18 ENCOUNTER — Telehealth: Payer: Self-pay | Admitting: Hematology and Oncology

## 2018-11-18 ENCOUNTER — Encounter: Payer: Self-pay | Admitting: *Deleted

## 2018-11-18 ENCOUNTER — Other Ambulatory Visit: Payer: Self-pay | Admitting: General Surgery

## 2018-11-18 DIAGNOSIS — C50311 Malignant neoplasm of lower-inner quadrant of right female breast: Secondary | ICD-10-CM

## 2018-11-18 DIAGNOSIS — D0511 Intraductal carcinoma in situ of right breast: Secondary | ICD-10-CM

## 2018-11-18 DIAGNOSIS — Z170 Estrogen receptor positive status [ER+]: Secondary | ICD-10-CM

## 2018-11-18 NOTE — Telephone Encounter (Signed)
Scheduled appt per 10/5 sch message - pt is aware of appt date and time

## 2018-11-20 ENCOUNTER — Encounter: Payer: Self-pay | Admitting: General Practice

## 2018-11-20 NOTE — Progress Notes (Signed)
Kara Ramos Psychosocial Distress Screening Spiritual Care  Met with Kara Ramos" by phone following Breast Multidisciplinary Clinic to introduce [place] team/resources, reviewing distress screen per protocol.  The patient scored a 2 on the Psychosocial Distress Thermometer which indicates mild distress. Also assessed for distress and other psychosocial needs.   ONCBCN DISTRESS SCREENING 11/20/2018  Screening Type Initial Screening  Distress experienced in past week (1-10) 2  Family Problem type Other (comment)  Emotional problem type Adjusting to illness  Physical Problem type Changes in urination  Referral to support programs Yes   Kara Ramos reports mild distress--more about the waiting for surgery than about any outcomes. She reports good support, faith as a coping tool, and appreciation that she's "a homebody" (given the covid isolations everyone is experiencing). We talked about her distress regarding her brother's experience with stage III lung cancer treatment, too, and covid's precluding the supportive contact they'd otherwise prefer to have.  Follow up needed: No. Kara Ramos is aware of ongoing chaplain and [place] team/programming availability and reports no other needs/concerns at this time, but knows to contact Team whenever desired. Please also page if needs arise or circumstances change. Thank you.   [place], [place], [facility] Pager [phone] Voicemail [phone]

## 2018-12-02 DIAGNOSIS — H43811 Vitreous degeneration, right eye: Secondary | ICD-10-CM | POA: Diagnosis not present

## 2018-12-13 ENCOUNTER — Encounter: Payer: Self-pay | Admitting: *Deleted

## 2018-12-18 NOTE — Progress Notes (Signed)
[id number] - [place], [place] - [address] [place] [address] Phone: [phone] Fax: [phone]    Your procedure is scheduled on Tuesday, November 10th.  Report to [facility] Main Entrance "A" at 5:30 A.M., and check in at the Admitting office.  Call this number if you have problems the morning of surgery:  [phone]  Call [phone] if you have any questions prior to your surgery date Monday-Friday 8am-4pm   Remember:  Do not eat after midnight the night before your surgery  You may drink clear liquids until 4:30 A.M. the morning of your surgery.   Clear liquids allowed are: Water, Non-Citrus Juices (without pulp), Carbonated Beverages, Clear Tea, Black Coffee Only, and Gatorade    Take these medicines the morning of surgery with A SIP OF WATER  metoprolol succinate  Omeprazole (PRILOSEC PO)   If needed - albuterol (PROAIR HFA)/inhaler - bring with you the day of surgery  Azelastine/nasal spray, Polyethylene Glycol/eye drops   As of today, STOP taking any Aspirin (unless otherwise instructed by your surgeon), Aleve, Naproxen, Ibuprofen, Motrin, Advil, Goody's, BC's, all herbal medications, fish oil, and all vitamins.   The Morning of Surgery  Do not wear jewelry, make-up or nail polish.  Do not wear lotions, powders, perfumes, or deodorant  Do not shave 48 hours prior to surgery.  .  Do not bring valuables to the hospital.  [facility] is not responsible for any belongings or valuables.  If you are a smoker, DO NOT Smoke 24 hours prior to surgery IF you wear a CPAP at night please bring your mask, tubing, and machine the morning of surgery   Remember that you must have someone to transport you home after your surgery, and remain with you for 24 hours if you are discharged the same day.  Contacts, glasses, hearing aids, dentures or bridgework may not be worn into surgery.   Leave  your suitcase in the car.  After surgery it may be brought to your room.  For patients admitted to the hospital, discharge time will be determined by your treatment team.  Patients discharged the day of surgery will not be allowed to drive home.   Special instructions:   [place]- Preparing For Surgery  Before surgery, you can play an important role. Because skin is not sterile, your skin needs to be as free of germs as possible. You can reduce the number of germs on your skin by washing with CHG (chlorahexidine gluconate) Soap before surgery.  CHG is an antiseptic cleaner which kills germs and bonds with the skin to continue killing germs even after washing.    Oral Hygiene is also important to reduce your risk of infection.  Remember - BRUSH YOUR TEETH THE MORNING OF SURGERY WITH YOUR REGULAR TOOTHPASTE  Please do not use if you have an allergy to CHG or antibacterial soaps. If your skin becomes reddened/irritated stop using the CHG.  Do not shave (including legs and underarms) for at least 48 hours prior to first CHG shower. It is OK to shave your face.  Please follow these instructions carefully.   1. Shower the NIGHT BEFORE SURGERY and the MORNING OF SURGERY with CHG Soap.   2. If you chose to wash your hair, wash your hair first as usual with your normal shampoo.  3. After you shampoo, rinse your hair and body thoroughly to remove the shampoo.  4. Use CHG as you would any other liquid soap. You can apply CHG directly to the skin and wash gently with a scrungie or a clean washcloth.   5. Apply the CHG Soap to your body ONLY FROM THE NECK DOWN.  Do not use on open wounds or open sores. Avoid contact with your eyes, ears, mouth and genitals (private parts). Wash Face and genitals (private parts)  with your normal soap.   6. Wash thoroughly, paying special attention to the area where your surgery will be performed.  7. Thoroughly rinse your body with warm water from the neck  down.  8. DO NOT shower/wash with your normal soap after using and rinsing off the CHG Soap.  9. Pat yourself dry with a CLEAN TOWEL.  10. Wear CLEAN PAJAMAS to bed the night before surgery, wear comfortable clothes the morning of surgery  11. Place CLEAN SHEETS on your bed the night of your first shower and DO NOT SLEEP WITH PETS.  Day of Surgery: Do not apply any deodorants/lotions. Please shower the morning of surgery with the CHG soap  Please wear clean clothes to the hospital/surgery center.   Remember to brush your teeth WITH YOUR REGULAR TOOTHPASTE.  Please read over the following fact sheets that you were given.

## 2018-12-19 ENCOUNTER — Other Ambulatory Visit: Payer: Self-pay

## 2018-12-19 ENCOUNTER — Encounter (HOSPITAL_COMMUNITY)
Admission: RE | Admit: 2018-12-19 | Discharge: 2018-12-19 | Disposition: A | Discharge status: Home / Self Care | Payer: Medicare Other | Source: Ambulatory Visit | Attending: General Surgery | Admitting: General Surgery

## 2018-12-19 ENCOUNTER — Encounter (HOSPITAL_COMMUNITY): Payer: Self-pay

## 2018-12-19 DIAGNOSIS — M1990 Unspecified osteoarthritis, unspecified site: Secondary | ICD-10-CM | POA: Diagnosis not present

## 2018-12-19 DIAGNOSIS — Z01812 Encounter for preprocedural laboratory examination: Secondary | ICD-10-CM | POA: Insufficient documentation

## 2018-12-19 DIAGNOSIS — Z87891 Personal history of nicotine dependence: Secondary | ICD-10-CM | POA: Insufficient documentation

## 2018-12-19 DIAGNOSIS — Z7901 Long term (current) use of anticoagulants: Secondary | ICD-10-CM | POA: Insufficient documentation

## 2018-12-19 DIAGNOSIS — K219 Gastro-esophageal reflux disease without esophagitis: Secondary | ICD-10-CM | POA: Insufficient documentation

## 2018-12-19 DIAGNOSIS — C50911 Malignant neoplasm of unspecified site of right female breast: Secondary | ICD-10-CM | POA: Diagnosis not present

## 2018-12-19 DIAGNOSIS — Z79899 Other long term (current) drug therapy: Secondary | ICD-10-CM | POA: Insufficient documentation

## 2018-12-19 HISTORY — DX: Other complications of anesthesia, initial encounter: T88.59XA

## 2018-12-19 HISTORY — DX: Unspecified asthma, uncomplicated: J45.909

## 2018-12-19 LAB — BASIC METABOLIC PANEL
Anion gap: 8 (ref 5–15)
BUN: 15 mg/dL (ref 8–23)
CO2: 28 mmol/L (ref 22–32)
Calcium: 9.8 mg/dL (ref 8.9–10.3)
Chloride: 103 mmol/L (ref 98–111)
Creatinine, Ser: 0.98 mg/dL (ref 0.44–1.00)
GFR calc Af Amer: >60 mL/min (ref >60)
GFR calc non Af Amer: >60 mL/min (ref >60)
Glucose, Bld: 97 mg/dL (ref 70–99)
Potassium: 4.4 mmol/L (ref 3.5–5.1)
Sodium: 139 mmol/L (ref 135–145)

## 2018-12-19 LAB — CBC
HCT: 42.7 % (ref 36.0–46.0)
Hemoglobin: 13.8 g/dL (ref 12.0–15.0)
MCH: 29 pg (ref 26.0–34.0)
MCHC: 32.3 g/dL (ref 30.0–36.0)
MCV: 89.7 fL (ref 80.0–100.0)
Platelets: 242 10*3/uL (ref 150–400)
RBC: 4.76 MIL/uL (ref 3.87–5.11)
RDW: 13.2 % (ref 11.5–15.5)
WBC: 5.7 10*3/uL (ref 4.0–10.5)
nRBC: 0 % (ref 0.0–0.2)

## 2018-12-19 NOTE — Progress Notes (Signed)
PCP - Dr. Yaakov Guthrie Cardiologist - Dr. Vernell Leep  PPM/ICD - denies Device Orders - N/A Rep Notified - N/A  Chest x-ray - N/A EKG - 03/21/2018 Stress Test - per patient about 2 years ago ECHO - per patient about 2 years ago Cardiac Cath - denies  Sleep Study - denies CPAP - N/A  Blood Thinner Instructions: N/A Aspirin Instructions: N/A  ERAS Protcol - Yes PRE-SURGERY Ensure or G2- None ordered  COVID TEST- Scheduled for 12/20/2018. Patient verbalized understanding of self-quarantine instructions, appointment time, and place.  Anesthesia review: YES, cardiac history.  Patient denies shortness of breath, fever, cough and chest pain at PAT appointment  All instructions explained to the patient, with a verbal understanding of the material. Patient agrees to go over the instructions while at home for a better understanding. Patient also instructed to self quarantine after being tested for COVID-19. The opportunity to ask questions was provided.

## 2018-12-20 ENCOUNTER — Other Ambulatory Visit (HOSPITAL_COMMUNITY)
Admission: RE | Admit: 2018-12-20 | Discharge: 2018-12-20 | Disposition: A | Discharge status: Home / Self Care | Payer: Medicare Other | Source: Ambulatory Visit | Attending: General Surgery | Admitting: General Surgery

## 2018-12-20 ENCOUNTER — Encounter (HOSPITAL_COMMUNITY): Payer: Self-pay

## 2018-12-20 DIAGNOSIS — Z01812 Encounter for preprocedural laboratory examination: Secondary | ICD-10-CM | POA: Diagnosis not present

## 2018-12-20 DIAGNOSIS — Z20828 Contact with and (suspected) exposure to other viral communicable diseases: Secondary | ICD-10-CM | POA: Diagnosis not present

## 2018-12-20 NOTE — Anesthesia Preprocedure Evaluation (Addendum)
Anesthesia Evaluation  Patient identified by MRN, date of birth, ID band Patient awake    Reviewed: Allergy & Precautions, NPO status , Patient's Chart, lab work & pertinent test results, reviewed documented beta blocker date and time   History of Anesthesia Complications Negative for: history of anesthetic complications  Airway Mallampati: II  TM Distance: >3 FB Neck ROM: Full    Dental no notable dental hx.    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  Reproductive/Obstetrics negative OB ROS                           Anesthesia Physical Anesthesia Plan  ASA: II  Anesthesia Plan: General   Post-op Pain Management:    Induction: Intravenous  PONV Risk Score and Plan: 3 and Treatment may vary due to age or medical condition, Ondansetron, Dexamethasone and Midazolam  Airway Management Planned: LMA  Additional Equipment: None  Intra-op Plan:   Post-operative Plan: Extubation in OR  Informed Consent: I have reviewed the patients History and Physical, chart, labs and discussed the procedure including the risks, benefits and alternatives for the proposed anesthesia with the patient or authorized representative who has indicated his/her understanding and acceptance.     Dental advisory given  Plan Discussed with: CRNA  Anesthesia Plan Comments: (PAT note written 12/20/2018 by Myra Gianotti, PA-C. )      Anesthesia Quick  Evaluation

## 2018-12-20 NOTE — Progress Notes (Signed)
Anesthesia Chart Review:  Case: [id number] Date/Time: 12/24/18 0715   Procedure: RIGHT BREAST LUMPECTOMY WITH RADIOACTIVE SEED LOCALIZATION (Right Breast)   Anesthesia type: General   Pre-op diagnosis: RIGHT BREAST CANCER   Location: [place] OR ROOM 02 / [place] OR   Surgeon: Stark Klein, MD     RSL scheduled for 12/23/18.  DISCUSSION: Patient is a 68 year old female scheduled for the above procedure.  History includes former smoker (quit 1977), right breast cancer (DCIS, 10/29/18), GERD, PACs, PVCs, cervical cancer (s/p hysterectomy 1978), IBS, asthma, sigomid colon reseciton (for endometrial disease, 1988. Reported being awake/aware during one previous surgery (question if procedure done under regional anesthesia) and can have issues with hypotension.   Last evaluation by cardiologist Dr. Virgina Jock on 03/21/18. PVCs controlled on b-blocker therapy. Chest "twinges" (lasting for 1-2 seconds) not felt to be anginal, and possible related to PVCs. Negative ETT 11/2016. One year follow-up planned.   COVID-19 test from 12/20/18 in process. If negative and otherwise not acute changes then I would anticipate that she can proceed as planned.    VS: BP (!) 135/59   Pulse 68   Temp (!) 36.1 C (Temporal)   Resp 18   Ht 5\' 2"  (1.575 m)   Wt 77.2 kg   SpO2 100%   BMI 31.12 kg/m     PROVIDERS: Vernie Shanks, MD is PCP Vernell Leep, MD is cardiologist. Last visit 03/21/18.  Nicholas Lose, MD is HEM-ONC Kyung Rudd, MD is RAD-ONC. Plan for radiation therapy 4-6 weeks after surgery.    LABS: Labs reviewed: Acceptable for surgery. (all labs ordered are listed, but only abnormal results are displayed)  Labs Reviewed  BASIC METABOLIC PANEL  CBC    EKG: EKG 03/21/2018: Sinus  Rhythm  Normal EKG  CV: Testing as outlined in 03/21/18 note by Dr. Virgina Jock:  Echocardiogram 12/12/2016: Left ventricle cavity is normal in size. Mild concentric hypertrophy of the left ventricle. Normal global wall  motion. LVEF 55-60% Left atrial cavity is mildly dilated. 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 Mild (Grade I) mitral regurgitation. IVC is dilated with a respiratory response of <50%. This may suggest elevated right heart pressure.  Event monitor 11/09/2016-12/08/2016: Sinus rhythm with tachycardia up to 140 bpm. Sinus bradycardia with 3.0 sec pause at 1:49 AM. No symptoms associated with this. Occasional lightheadedness, flutter during sinus rhythm. No atrial fibrillation, atrial flutter.  Treadmill exercise stress test 11/13/2016: Resting EKG: Normal sinus rhythm. Stress EKG: Negative for ischemia Normal blood pressure response. Occasional PACs throughout the stress test. No atrial fibrillation or heart block. Normal pressure response. Exercised for 5:02 minutes, achieved 4.9 mets. Exercise capacity is low.   Past Medical History:  Diagnosis Date  . Allergy   . Arthritis   . Asthma   . Cancer [facility]) 1978   cervical- had hysterectomy  . Complication of anesthesia    per patient 'one time was aware and awake during surgery and also BP can get really low"  . GERD (gastroesophageal reflux disease)   . History of IBS   . Irregular heart rhythm    occasional PAC'S, PVC's    Past Surgical History:  Procedure Laterality Date  . ABDOMINAL HYSTERECTOMY     both ovaries removed  . APPENDECTOMY    . BUNIONECTOMY     both feet  . CATARACT EXTRACTION     right  . COLON SURGERY  1988   part of colon removed d/t endometriosis  . COLONOSCOPY  05/2017  One benign polyp  . CORRECTION HAMMER TOE  03/2017    MEDICATIONS: . albuterol (PROAIR HFA) 108 (90 Base) MCG/ACT inhaler  . Azelastine HCl 0.15 % SOLN  . Cranberry 500 MG TABS  . ibuprofen (ADVIL) 200 MG tablet  . metoprolol succinate (TOPROL-XL) 25 MG 24 hr tablet  . Omeprazole (PRILOSEC PO)  . Polyethylene Glycol 400 (BLINK TEARS OP)  . Probiotic Product (PROBIOTIC PO)   No current  facility-administered medications for this encounter.      Myra Gianotti, PA-C Surgical Short Stay/Anesthesiology [facility] Phone [phone] [facility] Phone [phone] 12/20/2018 12:02 PM

## 2018-12-21 LAB — NOVEL CORONAVIRUS, NAA (HOSP ORDER, SEND-OUT TO REF LAB; TAT 18-24 HRS): SARS-CoV-2, NAA: NOT DETECTED

## 2018-12-23 ENCOUNTER — Ambulatory Visit
Admission: RE | Admit: 2018-12-23 | Discharge: 2018-12-23 | Disposition: A | Discharge status: Home / Self Care | Payer: Medicare Other | Source: Ambulatory Visit | Attending: General Surgery | Admitting: General Surgery

## 2018-12-23 ENCOUNTER — Other Ambulatory Visit: Payer: Self-pay

## 2018-12-23 ENCOUNTER — Ambulatory Visit: Payer: Medicare Other | Admitting: Allergy

## 2018-12-23 DIAGNOSIS — C50311 Malignant neoplasm of lower-inner quadrant of right female breast: Secondary | ICD-10-CM

## 2018-12-23 DIAGNOSIS — D0511 Intraductal carcinoma in situ of right breast: Secondary | ICD-10-CM | POA: Diagnosis not present

## 2018-12-23 DIAGNOSIS — Z170 Estrogen receptor positive status [ER+]: Secondary | ICD-10-CM

## 2018-12-23 NOTE — H&P (Signed)
Kara Ramos Location: [facility] Patient #: [id number] DOB: June 01, 1950 Undefined / Language: Kara Ramos / Race: White Female   History of Present Illness The patient is a 68 year old female who presents with breast cancer. Pt is a 68 yo F who is referred for consultation at the request of Dr. Dario Guardian for a new diagnosis of right breast cancer. The patient presented with screening detected right breast calcifications. Diagnostic imaging showed 1.6 cm of calcifications at 3 o'clock. 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   The patient has a personal history of cervical cancer requiring hysterectomy in 64 (age 75). She has had numerous cysts in her left breast in the past, but has not had as much problem in her right. She has also had a fair amount of left breast tenderness for years. She had a family history of cancer with a paternal grandmother having "female organ cancer" in the 62s, a maternal grandmother having colon cancer at age 25, a father with brain cancer, and a brother with lung cancer. Menarche was age 71. She is a G0P0. She had a brief use of oral contraceptives and used HRT for around 30 years. She is up to date with colonoscopy and bone density. She is a former smoker (quit age 89). She drinks around 0-3 drinks per day. she does not use any illicit drugs.    dx mammogram 9/14 CLINICAL DATA: Recall from screening mammography with tomosynthesis, calcifications involving the slight LOWER INNER QUADRANT of the RIGHT breast at ANTERIOR depth.  EXAM: DIGITAL DIAGNOSTIC RIGHT MAMMOGRAM WITH CAD  COMPARISON: Previous exam(s).  ACR Breast Density Category c: The breast tissue is heterogeneously dense, which may obscure small masses.  FINDINGS: Standard spot magnification CC and mediolateral views of the RIGHT breast calcifications and a standard 2D full field mediolateral view of the RIGHT breast were obtained.  Spot  magnification views confirm a group of fine heterogeneous calcifications in the slight LOWER INNER QUADRANT which span approximately 0.7 x 1.4 x 1.6 cm and which are predominantly punctate, though there are scattered linear forms within the group. There is no associated mass or architectural distortion.  The full field mediolateral image was processed with CAD.  IMPRESSION: Indeterminate approximate 1.6 cm group of calcifications involving the slight LOWER INNER QUADRANT of the RIGHT breast.  RECOMMENDATION: Stereotactic tomosynthesis core needle biopsy of the RIGHT breast calcifications.  The stereotactic tomosynthesis core needle biopsy procedure was discussed with the patient and her questions were answered. She wishes to proceed and the biopsy has been scheduled for blank.  I have discussed the findings and recommendations with the patient. If applicable, a reminder letter will be sent to the patient regarding the next appointment.  BI-RADS CATEGORY 4: Suspicious.  pathology 10/29/2018 Diagnosis Breast, right, needle core biopsy, LIQ middle depth grouped calcs - DUCTAL CARCINOMA IN SITU, INTERMEDIATE GRADE. SEE NOTE - CALCIFICATIONS IMMUNOHISTOCHEMICAL AND MORPHOMETRIC ANALYSIS PERFORMED MANUALLY Estrogen Receptor: 95%, POSITIVE, STRONG STAINING INTENSITY Progesterone Receptor: 60%, POSITIVE, MODERATE STAINING INTENSITY   cmet and CBC were normal 11/06/2018   Past Surgical History Cataract Surgery  Right. Colon Removal - Partial  Foot Surgery  Bilateral. Hysterectomy (not due to cancer) - Complete   Diagnostic Studies History Colonoscopy  1-5 years ago Mammogram  within last year Pap Smear  1-5 years ago  Medication History  Medications Reconciled  Social History Alcohol use  Occasional alcohol use. Caffeine use  Carbonated beverages, Coffee. No drug use  Tobacco use  Former smoker.  Family History Arthritis  Sister. Cancer  Brother,  Father. Heart Disease  Mother, Sister.  Pregnancy / Birth History Age at menarche  84 years. Contraceptive History  Oral contraceptives. Gravida  0 Para  0  Other Problems Arthritis  Asthma  Back Pain  Breast Cancer  Cervical Cancer  Gastroesophageal Reflux Disease  Hemorrhoids     Review of Systems  General Present- Night Sweats. Not Present- Appetite Loss, Chills, Fatigue, Fever, Weight Gain and Weight Loss. Skin Not Present- Change in Wart/Mole, Dryness, Hives, Jaundice, New Lesions, Non-Healing Wounds, Rash and Ulcer. HEENT Present- Seasonal Allergies and Wears glasses/contact lenses. Not Present- Earache, Hearing Loss, Hoarseness, Nose Bleed, Oral Ulcers, Ringing in the Ears, Sinus Pain, Sore Throat, Visual Disturbances and Yellow Eyes. Breast Not Present- Breast Mass, Breast Pain, Nipple Discharge and Skin Changes. Cardiovascular Present- Palpitations. Not Present- Chest Pain, Difficulty Breathing Lying Down, Leg Cramps, Rapid Heart Rate, Shortness of Breath and Swelling of Extremities. Gastrointestinal Present- Abdominal Pain, Constipation, Hemorrhoids and Indigestion. Not Present- Bloating, Bloody Stool, Change in Bowel Habits, Chronic diarrhea, Difficulty Swallowing, Excessive gas, Gets full quickly at meals, Nausea, Rectal Pain and Vomiting. Musculoskeletal Present- Back Pain and Joint Pain. Not Present- Joint Stiffness, Muscle Pain, Muscle Weakness and Swelling of Extremities. Neurological Present- Weakness. Not Present- Decreased Memory, Fainting, Headaches, Numbness, Seizures, Tingling, Tremor and Trouble walking. Endocrine Present- Cold Intolerance and Hot flashes. Not Present- Excessive Hunger, Hair Changes, Heat Intolerance and New Diabetes. Hematology Present- Easy Bruising. Not Present- Blood Thinners, Excessive bleeding, Gland problems, HIV and Persistent Infections.  Vitals Weight: 170.8 lb Height: 62in Body Surface Area: 1.79 m Body Mass  Index: 31.24 kg/m  Pulse: 58 (Regular)  Resp.: 98 (Unlabored)  BP: 117/58 (Sitting, Left Arm, Standard)       Physical Exam  General Mental Status-Alert. General Appearance-Consistent with stated age. Hydration-Well hydrated. Voice-Normal.  Head and Neck Head-normocephalic, atraumatic with no lesions or palpable masses. Trachea-midline. Thyroid Gland Characteristics - normal size and consistency.  Eye Eyeball - Bilateral-Extraocular movements intact. Sclera/Conjunctiva - Bilateral-No scleral icterus.  Chest and Lung Exam Chest and lung exam reveals -quiet, even and easy respiratory effort with no use of accessory muscles and on auscultation, normal breath sounds, no adventitious sounds and normal vocal resonance. Inspection Chest Wall - Normal. Back - normal.  Breast Note: faint bruising on the right medial breast. left breast is tender. She has relatively dense tissue laterally on both breasts. No palpable masses. no nipple retraction of nipple discharge. no skin dimpling. no LAD.   Cardiovascular Cardiovascular examination reveals -normal heart sounds, regular rate and rhythm with no murmurs and normal pedal pulses bilaterally.  Abdomen Inspection Inspection of the abdomen reveals - No Hernias. Palpation/Percussion Palpation and Percussion of the abdomen reveal - Soft, Non Tender, No Rebound tenderness, No Rigidity (guarding) and No hepatosplenomegaly. Auscultation Auscultation of the abdomen reveals - Bowel sounds normal.  Neurologic Neurologic evaluation reveals -alert and oriented x 3 with no impairment of recent or remote memory. Mental Status-Normal.  Musculoskeletal Global Assessment -Note: no gross deformities.  Normal Exam - Left-Upper Extremity Strength Normal and Lower Extremity Strength Normal. Normal Exam - Right-Upper Extremity Strength Normal and Lower Extremity Strength Normal.  Lymphatic Head &  Neck  General Head & Neck Lymphatics: Bilateral - Description - Normal. Axillary  General Axillary Region: Bilateral - Description - Normal. Tenderness - Non Tender. Femoral & Inguinal  Generalized Femoral & Inguinal Lymphatics: Bilateral - Description - No Generalized lymphadenopathy.  Assessment & Plan MALIGNANT NEOPLASM OF LOWER-INNER QUADRANT OF RIGHT BREAST OF FEMALE, ESTROGEN RECEPTOR POSITIVE (C50.311) Impression: Pt has a new diagnosis of cTis right breast cancer. She is a candidate for the COMET trial. The research nurse will speak with her. I discussed seed localized lumpectomy should she randomize to surgery. I reviewed risks of surgery including bleeding, infection, pain, possible need for additional surgeries or procedures, possible dissatisfaction with the breast appearance. I discussed seed placement with the patient as well as covid testing.  She has opted NOT for comet trial and would like to have standard of care which is surgery.

## 2018-12-24 ENCOUNTER — Ambulatory Visit (HOSPITAL_COMMUNITY): Payer: Medicare Other | Admitting: Vascular Surgery

## 2018-12-24 ENCOUNTER — Ambulatory Visit (HOSPITAL_COMMUNITY)
Admission: RE | Admit: 2018-12-24 | Discharge: 2018-12-24 | Disposition: A | Discharge status: Home / Self Care | Payer: Medicare Other | Attending: General Surgery | Admitting: General Surgery

## 2018-12-24 ENCOUNTER — Ambulatory Visit (HOSPITAL_COMMUNITY): Payer: Medicare Other | Admitting: Certified Registered"

## 2018-12-24 ENCOUNTER — Other Ambulatory Visit: Payer: Self-pay

## 2018-12-24 ENCOUNTER — Encounter (HOSPITAL_COMMUNITY): Payer: Self-pay | Admitting: *Deleted

## 2018-12-24 ENCOUNTER — Encounter (HOSPITAL_COMMUNITY)
Admission: RE | Disposition: A | Discharge status: Home / Self Care | Payer: Self-pay | Source: Home / Self Care | Attending: General Surgery

## 2018-12-24 ENCOUNTER — Ambulatory Visit
Admission: RE | Admit: 2018-12-24 | Discharge: 2018-12-24 | Disposition: A | Discharge status: Home / Self Care | Payer: Medicare Other | Source: Ambulatory Visit | Attending: General Surgery | Admitting: General Surgery

## 2018-12-24 DIAGNOSIS — Z8541 Personal history of malignant neoplasm of cervix uteri: Secondary | ICD-10-CM | POA: Insufficient documentation

## 2018-12-24 DIAGNOSIS — Z87891 Personal history of nicotine dependence: Secondary | ICD-10-CM | POA: Insufficient documentation

## 2018-12-24 DIAGNOSIS — D0511 Intraductal carcinoma in situ of right breast: Secondary | ICD-10-CM | POA: Insufficient documentation

## 2018-12-24 DIAGNOSIS — K219 Gastro-esophageal reflux disease without esophagitis: Secondary | ICD-10-CM | POA: Insufficient documentation

## 2018-12-24 DIAGNOSIS — Z170 Estrogen receptor positive status [ER+]: Secondary | ICD-10-CM

## 2018-12-24 DIAGNOSIS — Z881 Allergy status to other antibiotic agents status: Secondary | ICD-10-CM | POA: Insufficient documentation

## 2018-12-24 DIAGNOSIS — M1990 Unspecified osteoarthritis, unspecified site: Secondary | ICD-10-CM | POA: Diagnosis not present

## 2018-12-24 DIAGNOSIS — C50311 Malignant neoplasm of lower-inner quadrant of right female breast: Secondary | ICD-10-CM

## 2018-12-24 DIAGNOSIS — Z888 Allergy status to other drugs, medicaments and biological substances status: Secondary | ICD-10-CM | POA: Insufficient documentation

## 2018-12-24 DIAGNOSIS — J45909 Unspecified asthma, uncomplicated: Secondary | ICD-10-CM | POA: Insufficient documentation

## 2018-12-24 DIAGNOSIS — C50411 Malignant neoplasm of upper-outer quadrant of right female breast: Secondary | ICD-10-CM | POA: Diagnosis present

## 2018-12-24 DIAGNOSIS — N6011 Diffuse cystic mastopathy of right breast: Secondary | ICD-10-CM | POA: Diagnosis not present

## 2018-12-24 DIAGNOSIS — C50911 Malignant neoplasm of unspecified site of right female breast: Secondary | ICD-10-CM | POA: Diagnosis not present

## 2018-12-24 HISTORY — PX: BREAST LUMPECTOMY WITH RADIOACTIVE SEED LOCALIZATION: SHX6424

## 2018-12-24 SURGERY — BREAST LUMPECTOMY WITH RADIOACTIVE SEED LOCALIZATION
Anesthesia: General | Site: Breast | Laterality: Right

## 2018-12-24 MED ORDER — LIDOCAINE HCL (PF) 1 % IJ SOLN
INTRAMUSCULAR | Status: AC
  Filled 2018-12-24: qty 30

## 2018-12-24 MED ORDER — CHLORHEXIDINE GLUCONATE CLOTH 2 % EX PADS: 6.0000 | MEDICATED_PAD | Freq: Once | CUTANEOUS | Status: DC

## 2018-12-24 MED ORDER — LIDOCAINE 2% (20 MG/ML) 5 ML SYRINGE
INTRAMUSCULAR | Status: DC | PRN
  Administered 2018-12-24 (×2): 40 mg via INTRAVENOUS

## 2018-12-24 MED ORDER — LACTATED RINGERS IV SOLN
INTRAVENOUS | Status: DC | PRN
  Administered 2018-12-24: 07:00:00 via INTRAVENOUS

## 2018-12-24 MED ORDER — OXYCODONE HCL 5 MG PO TABS: 5.0000 mg | ORAL_TABLET | Freq: Once | ORAL | Status: DC | PRN

## 2018-12-24 MED ORDER — PROMETHAZINE HCL 25 MG/ML IJ SOLN: 6.2500 mg | INTRAMUSCULAR | Status: DC | PRN

## 2018-12-24 MED ORDER — FENTANYL CITRATE (PF) 250 MCG/5ML IJ SOLN
INTRAMUSCULAR | Status: DC | PRN
  Administered 2018-12-24 (×2): 50 ug via INTRAVENOUS
  Administered 2018-12-24 (×2): 25 ug via INTRAVENOUS

## 2018-12-24 MED ORDER — MIDAZOLAM HCL 5 MG/5ML IJ SOLN
INTRAMUSCULAR | Status: DC | PRN
  Administered 2018-12-24: 2 mg via INTRAVENOUS

## 2018-12-24 MED ORDER — MIDAZOLAM HCL 2 MG/2ML IJ SOLN
INTRAMUSCULAR | Status: AC
  Filled 2018-12-24: qty 2

## 2018-12-24 MED ORDER — BUPIVACAINE-EPINEPHRINE 0.5% -1:200000 IJ SOLN
INTRAMUSCULAR | Status: AC
  Filled 2018-12-24: qty 1

## 2018-12-24 MED ORDER — CEFAZOLIN SODIUM-DEXTROSE 2-4 GM/100ML-% IV SOLN
2.0000 g | INTRAVENOUS | Status: AC
  Administered 2018-12-24: 2 g via INTRAVENOUS
  Filled 2018-12-24: qty 100

## 2018-12-24 MED ORDER — 0.9 % SODIUM CHLORIDE (POUR BTL) OPTIME
TOPICAL | Status: DC | PRN
  Administered 2018-12-24: 08:00:00 1000 mL

## 2018-12-24 MED ORDER — PROPOFOL 10 MG/ML IV BOLUS
INTRAVENOUS | Status: DC | PRN
  Administered 2018-12-24: 30 mg via INTRAVENOUS
  Administered 2018-12-24: 150 mg via INTRAVENOUS

## 2018-12-24 MED ORDER — BUPIVACAINE HCL (PF) 0.25 % IJ SOLN
INTRAMUSCULAR | Status: AC
  Filled 2018-12-24: qty 30

## 2018-12-24 MED ORDER — ACETAMINOPHEN 500 MG PO TABS
1000.0000 mg | ORAL_TABLET | Freq: Once | ORAL | Status: AC
  Administered 2018-12-24: 1000 mg via ORAL
  Filled 2018-12-24: qty 2

## 2018-12-24 MED ORDER — OXYCODONE HCL 5 MG PO TABS: 5.0000 mg | ORAL_TABLET | Freq: Four times a day (QID) | ORAL | 0 refills | Status: DC | PRN

## 2018-12-24 MED ORDER — PHENYLEPHRINE HCL-NACL 10-0.9 MG/250ML-% IV SOLN
INTRAVENOUS | Status: DC | PRN
  Administered 2018-12-24: 50 ug/min via INTRAVENOUS

## 2018-12-24 MED ORDER — FENTANYL CITRATE (PF) 100 MCG/2ML IJ SOLN: 25.0000 ug | INTRAMUSCULAR | Status: DC | PRN

## 2018-12-24 MED ORDER — ONDANSETRON HCL 4 MG/2ML IJ SOLN
INTRAMUSCULAR | Status: DC | PRN
  Administered 2018-12-24: 4 mg via INTRAVENOUS

## 2018-12-24 MED ORDER — LIDOCAINE HCL 1 % IJ SOLN
INTRAMUSCULAR | Status: DC | PRN
  Administered 2018-12-24: 30 mL via INTRAMUSCULAR

## 2018-12-24 MED ORDER — LIDOCAINE-EPINEPHRINE 1 %-1:100000 IJ SOLN
INTRAMUSCULAR | Status: AC
  Filled 2018-12-24: qty 1

## 2018-12-24 MED ORDER — OXYCODONE HCL 5 MG/5ML PO SOLN: 5.0000 mg | Freq: Once | ORAL | Status: DC | PRN

## 2018-12-24 MED ORDER — FENTANYL CITRATE (PF) 250 MCG/5ML IJ SOLN
INTRAMUSCULAR | Status: AC
  Filled 2018-12-24: qty 5

## 2018-12-24 MED ORDER — GABAPENTIN 300 MG PO CAPS
300.0000 mg | ORAL_CAPSULE | ORAL | Status: AC
  Administered 2018-12-24: 300 mg via ORAL
  Filled 2018-12-24: qty 1

## 2018-12-24 MED ORDER — DEXAMETHASONE SODIUM PHOSPHATE 10 MG/ML IJ SOLN
INTRAMUSCULAR | Status: DC | PRN
  Administered 2018-12-24: 10 mg via INTRAVENOUS

## 2018-12-24 MED ORDER — STERILE WATER FOR IRRIGATION IR SOLN
Status: DC | PRN
  Administered 2018-12-24: 1000 mL

## 2018-12-24 SURGICAL SUPPLY — 39 items
BINDER BREAST XLRG (GAUZE/BANDAGES/DRESSINGS) ×2 IMPLANT
CHLORAPREP W/TINT 26 (MISCELLANEOUS) ×2 IMPLANT
CLIP VESOCCLUDE LG 6/CT (CLIP) ×2 IMPLANT
CLIP VESOCCLUDE MED 6/CT (CLIP) ×2 IMPLANT
COVER PROBE W GEL 5X96 (DRAPES) ×2 IMPLANT
COVER SURGICAL LIGHT HANDLE (MISCELLANEOUS) ×2 IMPLANT
DERMABOND ADVANCED (GAUZE/BANDAGES/DRESSINGS) ×1
DERMABOND ADVANCED .7 DNX12 (GAUZE/BANDAGES/DRESSINGS) ×1 IMPLANT
DEVICE DUBIN SPECIMEN MAMMOGRA (MISCELLANEOUS) ×2 IMPLANT
DRAPE CHEST BREAST 15X10 FENES (DRAPES) ×2 IMPLANT
ELECT COATED BLADE 2.86 ST (ELECTRODE) ×2 IMPLANT
ELECT REM PT RETURN 9FT ADLT (ELECTROSURGICAL) ×2
ELECTRODE REM PT RTRN 9FT ADLT (ELECTROSURGICAL) ×1 IMPLANT
GAUZE SPONGE 4X4 12PLY STRL LF (GAUZE/BANDAGES/DRESSINGS) ×2 IMPLANT
GLOVE BIO SURGEON STRL SZ 6 (GLOVE) ×2 IMPLANT
GLOVE BIO SURGEON STRL SZ7.5 (GLOVE) ×2 IMPLANT
GLOVE BIOGEL PI IND STRL 7.0 (GLOVE) ×1 IMPLANT
GLOVE BIOGEL PI IND STRL 7.5 (GLOVE) ×1 IMPLANT
GLOVE BIOGEL PI INDICATOR 7.0 (GLOVE) ×1
GLOVE BIOGEL PI INDICATOR 7.5 (GLOVE) ×1
GLOVE INDICATOR 6.5 STRL GRN (GLOVE) ×2 IMPLANT
GOWN STRL REUS W/ TWL LRG LVL3 (GOWN DISPOSABLE) ×3 IMPLANT
GOWN STRL REUS W/TWL 2XL LVL3 (GOWN DISPOSABLE) ×2 IMPLANT
GOWN STRL REUS W/TWL LRG LVL3 (GOWN DISPOSABLE) ×3
KIT BASIN OR (CUSTOM PROCEDURE TRAY) ×2 IMPLANT
KIT MARKER MARGIN INK (KITS) ×2 IMPLANT
LIGHT WAVEGUIDE WIDE FLAT (MISCELLANEOUS) ×2 IMPLANT
NS IRRIG 1000ML POUR BTL (IV SOLUTION) ×2 IMPLANT
PACK GENERAL/GYN (CUSTOM PROCEDURE TRAY) ×2 IMPLANT
PAD ABD 8X10 STRL (GAUZE/BANDAGES/DRESSINGS) ×2 IMPLANT
STRIP CLOSURE SKIN 1/2X4 (GAUZE/BANDAGES/DRESSINGS) ×2 IMPLANT
SUT MNCRL AB 4-0 PS2 18 (SUTURE) ×2 IMPLANT
SUT SILK 2 0 SH (SUTURE) IMPLANT
SUT VIC AB 2-0 SH 27 (SUTURE) ×1
SUT VIC AB 2-0 SH 27XBRD (SUTURE) ×1 IMPLANT
SUT VIC AB 3-0 SH 27 (SUTURE) ×1
SUT VIC AB 3-0 SH 27X BRD (SUTURE) ×1 IMPLANT
SYR CONTROL 10ML LL (SYRINGE) ×2 IMPLANT
TOWEL GREEN STERILE (TOWEL DISPOSABLE) ×2 IMPLANT

## 2018-12-24 NOTE — Op Note (Signed)
Right Breast Radioactive seed localized lumpectomy  Indications: This patient presents with history of right breast cancer, upper outer quadrant, ER/PR +  Pre-operative Diagnosis: right breast cancer, cTis  Post-operative Diagnosis: Same  Surgeon: BYERLY,FAERA   Anesthesia: General endotracheal anesthesia  ASA Class: 2  Procedure Details  The patient was seen in the Holding Room. The risks, benefits, complications, treatment options, and expected outcomes were discussed with the patient. The possibilities of bleeding, infection, the need for additional procedures, failure to diagnose a condition, and creating a complication requiring other procedures or operations were discussed with the patient. The patient concurred with the proposed plan, giving informed consent.  The site of surgery properly noted/marked. The patient was taken to Operating Room # 2, identified, and the procedure verified as right breast seed localized lumpectomy.  The right breast and chest were prepped and draped in standard fashion. A medial circumareolar incision was made near the previously placed radioactive seed.  Dissection was carried down around the point of maximum signal intensity. The cautery was used to perform the dissection.   The specimen was inked with the margin marker paint kit.  Specimen radiography confirmed inclusion of the mammographic lesion, the clip, and the seed.  The background signal in the breast was zero.  Hemostasis was achieved with cautery.  The cavity was marked with clips on each border other than the anterior border.  The wound was irrigated and closed with 3-0 vicryl interrupted deep dermal sutures and 4-0 monocryl running subcuticular suture.      Sterile dressings were applied. At the end of the operation, all sponge, instrument, and needle counts were correct.   Findings: Seed, clip in specimen.   Estimated Blood Loss:  min         Specimens: left breast tissue with seed          Complications:  None; patient tolerated the procedure well.         Disposition: PACU - hemodynamically stable.         Condition: stable     

## 2018-12-24 NOTE — Discharge Instructions (Addendum)
[facility] °Office Phone Number [phone] ° °BREAST BIOPSY/ PARTIAL MASTECTOMY: POST OP INSTRUCTIONS ° °Always review your discharge instruction sheet given to you by the facility where your surgery was performed. ° °IF YOU HAVE DISABILITY OR FAMILY LEAVE FORMS, YOU MUST BRING THEM TO THE OFFICE FOR PROCESSING.  DO NOT GIVE THEM TO YOUR DOCTOR. ° °1. A prescription for pain medication may be given to you upon discharge.  Take your pain medication as prescribed, if needed.  If narcotic pain medicine is not needed, then you may take acetaminophen (Tylenol) or ibuprofen (Advil) as needed. °2. Take your usually prescribed medications unless otherwise directed °3. If you need a refill on your pain medication, please contact your pharmacy.  They will contact our office to request authorization.  Prescriptions will not be filled after 5pm or on week-ends. °4. You should eat very light the first 24 hours after surgery, such as soup, crackers, pudding, etc.  Resume your normal diet the day after surgery. °5. Most patients will experience some swelling and bruising in the breast.  Ice packs and a good support bra will help.  Swelling and bruising can take several days to resolve.  °6. It is common to experience some constipation if taking pain medication after surgery.  Increasing fluid intake and taking a stool softener will usually help or prevent this problem from occurring.  A mild laxative (Milk of Magnesia or Miralax) should be taken according to package directions if there are no bowel movements after 48 hours. °7. Unless discharge instructions indicate otherwise, you may remove your bandages 48 hours after surgery, and you may shower at that time.  You may have steri-strips (small skin tapes) in place directly over the incision.  These strips should be left on the skin for 7-10 days.   Any sutures or staples will be removed at the office during your follow-up visit. °8. ACTIVITIES:  You may resume  regular daily activities (gradually increasing) beginning the next day.  Wearing a good support bra or sports bra (or the breast binder) minimizes pain and swelling.  You may have sexual intercourse when it is comfortable. °a. You may drive when you no longer are taking prescription pain medication, you can comfortably wear a seatbelt, and you can safely maneuver your car and apply brakes. °b. RETURN TO WORK:  __________1 week_______________ °9. You should see your doctor in the office for a follow-up appointment approximately two weeks after your surgery.  Your doctor’s nurse will typically make your follow-up appointment when she calls you with your pathology report.  Expect your pathology report 2-3 business days after your surgery.  You may call to check if you do not hear from us after three days. ° ° °WHEN TO CALL YOUR DOCTOR: °1. Fever over 101.0 °2. Nausea and/or vomiting. °3. Extreme swelling or bruising. °4. Continued bleeding from incision. °5. Increased pain, redness, or drainage from the incision. ° °The clinic staff is available to answer your questions during regular business hours.  Please don’t hesitate to call and ask to speak to one of the nurses for clinical concerns.  If you have a medical emergency, go to the nearest emergency room or call 911.  A surgeon from [facility] is always on call at the hospital. ° °For further questions, please visit [email or web address]  ° °

## 2018-12-24 NOTE — Transfer of Care (Signed)
Immediate Anesthesia Transfer of Care Note  Patient: Kara Ramos  Procedure(s) Performed: RIGHT BREAST LUMPECTOMY WITH RADIOACTIVE SEED LOCALIZATION (Right Breast)  Patient Location: PACU  Anesthesia Type:General  Level of Consciousness: drowsy  Airway & Oxygen Therapy: Patient Spontanous Breathing and Patient connected to face mask oxygen  Post-op Assessment: Report given to RN and Post -op Vital signs reviewed and stable  Post vital signs: Reviewed and stable  Last Vitals:  Vitals Value Taken Time  BP    Temp    Pulse    Resp    SpO2      Last Pain:  Vitals:   12/24/18 0623  TempSrc: Oral  PainSc:       Patients Stated Pain Goal: 3 ([id number])  Complications: No apparent anesthesia complications

## 2018-12-24 NOTE — Anesthesia Postprocedure Evaluation (Signed)
Anesthesia Post Note  Patient: Kara Ramos  Procedure(s) Performed: RIGHT BREAST LUMPECTOMY WITH RADIOACTIVE SEED LOCALIZATION (Right Breast)     Patient location during evaluation: PACU Anesthesia Type: General Level of consciousness: awake and alert and oriented Pain management: pain level controlled Vital Signs Assessment: post-procedure vital signs reviewed and stable Respiratory status: spontaneous breathing, nonlabored ventilation and respiratory function stable Cardiovascular status: blood pressure returned to baseline Postop Assessment: no apparent nausea or vomiting Anesthetic complications: no    Last Vitals:  Vitals:   12/24/18 0910 12/24/18 0915  BP: (!) 160/75 (!) 155/84  Pulse: 93 81  Resp: 15 17  Temp:  (!) 36.1 C  SpO2: 97% 100%    Last Pain:  Vitals:   12/24/18 0915  TempSrc:   PainSc: 0-No pain                 Brennan Bailey

## 2018-12-24 NOTE — Anesthesia Procedure Notes (Signed)
Procedure Name: LMA Insertion Date/Time: 12/24/2018 7:52 AM Performed by: Cleda Daub, CRNA Pre-anesthesia Checklist: Patient identified, Emergency Drugs available, Suction available and Patient being monitored Patient Re-evaluated:Patient Re-evaluated prior to induction Oxygen Delivery Method: Circle system utilized Preoxygenation: Pre-oxygenation with 100% oxygen Induction Type: IV induction LMA: LMA inserted LMA Size: 4.0 Placement Confirmation: positive ETCO2 and breath sounds checked- equal and bilateral Tube secured with: Tape Dental Injury: Teeth and Oropharynx as per pre-operative assessment

## 2018-12-24 NOTE — Interval H&P Note (Signed)
History and Physical Interval Note:  12/24/2018 7:39 AM  Kara Ramos  has presented today for surgery, with the diagnosis of RIGHT BREAST CANCER.  The various methods of treatment have been discussed with the patient and family. After consideration of risks, benefits and other options for treatment, the patient has consented to  Procedure(s): RIGHT BREAST LUMPECTOMY WITH RADIOACTIVE SEED LOCALIZATION (Right) as a surgical intervention.  The patient's history has been reviewed, patient examined, no change in status, stable for surgery.  I have reviewed the patient's chart and labs.  Questions were answered to the patient's satisfaction.     Stark Klein

## 2018-12-25 ENCOUNTER — Encounter (HOSPITAL_COMMUNITY): Payer: Self-pay | Admitting: General Surgery

## 2018-12-25 LAB — SURGICAL PATHOLOGY

## 2018-12-25 NOTE — Progress Notes (Signed)
Please let patient know margins are negative and there is no invasive cancer seen.

## 2018-12-31 DIAGNOSIS — Z0120 Encounter for dental examination and cleaning without abnormal findings: Secondary | ICD-10-CM | POA: Diagnosis not present

## 2018-12-31 NOTE — Progress Notes (Signed)
Patient Care Team: Vernie Shanks, MD as PCP - General (Family Medicine) Mauro Kaufmann, RN as Oncology Nurse Navigator Rockwell Germany, RN as Oncology Nurse Navigator Nicholas Lose, MD as Consulting Physician (Hematology and Oncology) Kyung Rudd, MD as Consulting Physician (Radiation Oncology) Stark Klein, MD as Consulting Physician (General Surgery)  DIAGNOSIS:    ICD-10-CM   1. Ductal carcinoma in situ (DCIS) of right breast  D05.11     SUMMARY OF ONCOLOGIC HISTORY: Oncology History  Ductal carcinoma in situ (DCIS) of right breast  11/01/2018 Initial Diagnosis   Routine screening mammogram detected calcifications in the LIQ spanning 1.6cm. 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    12/24/2018 Surgery   Right lumpectomy [facility]): DCIS, intermediate grade, 1.0cm, clear margins.     CHIEF COMPLIANT: Follow-up s/p lumpectomy to review pathology  INTERVAL HISTORY: Kara Ramos is a 68 y.o. with above-mentioned history of right breast DCIS. She underwent a lumpectomy on 12/24/18 with Dr. Barry Dienes for which pathology showed ductal carcinoma in situ, intermediate grade, 1.0cm, clear margins. She presents to the clinic today to review the pathology report and discuss further treatment.   REVIEW OF SYSTEMS:   Constitutional: Denies fevers, chills or abnormal weight loss Eyes: Denies blurriness of vision Ears, nose, mouth, throat, and face: Denies mucositis or sore throat Respiratory: Denies cough, dyspnea or wheezes Cardiovascular: Denies palpitation, chest discomfort Gastrointestinal: Denies nausea, heartburn or change in bowel habits Skin: Denies abnormal skin rashes Lymphatics: Denies new lymphadenopathy or easy bruising Neurological: Denies numbness, tingling or new weaknesses Behavioral/Psych: Mood is stable, no new changes  Extremities: No lower extremity edema Breast: denies any pain or lumps or nodules in either breasts All  other systems were reviewed with the patient and are negative.  I have reviewed the past medical history, past surgical history, social history and family history with the patient and they are unchanged from previous note.  ALLERGIES:  is allergic to azithromycin; erythromycin; tape; and tessalon [benzonatate].  MEDICATIONS:  Current Outpatient Medications  Medication Sig Dispense Refill  . albuterol (PROAIR HFA) 108 (90 Base) MCG/ACT inhaler Take 2 puffs by mouth every 6 (six) hours as needed for wheezing or shortness of breath.     . Azelastine HCl 0.15 % SOLN 1-2 sprays per nostril 1-2 times a day as needed for runny nose. (Patient taking differently: Place 1-2 sprays into both nostrils 2 (two) times daily as needed (runny nose). ) 30 mL 5  . Cranberry 500 MG TABS Take 500 mg by mouth daily.     Marland Kitchen ibuprofen (ADVIL) 200 MG tablet Take 400 mg by mouth every 6 (six) hours as needed for moderate pain.     . metoprolol succinate (TOPROL-XL) 25 MG 24 hr tablet Take 25 mg by mouth daily.    . Omeprazole (PRILOSEC PO) Take 20 mg by mouth daily.     Marland Kitchen oxyCODONE (OXY IR/ROXICODONE) 5 MG immediate release tablet Take 1 tablet (5 mg total) by mouth every 6 (six) hours as needed for severe pain. 15 tablet 0  . Polyethylene Glycol 400 (BLINK TEARS OP) Place 1 drop into both eyes 2 (two) times daily as needed (dry eyes).     . Probiotic Product (PROBIOTIC PO) Take 1 tablet by mouth daily.     No current facility-administered medications for this visit.     PHYSICAL EXAMINATION: ECOG PERFORMANCE STATUS: 1 - Symptomatic but completely ambulatory  Vitals:   01/01/19 1501  BP: Marland Kitchen)  123/55  Pulse: 65  Resp: 17  Temp: 97.8 F (36.6 C)  SpO2: 98%   Filed Weights   01/01/19 1501  Weight: 171 lb 8 oz (77.8 kg)    GENERAL: alert, no distress and comfortable SKIN: skin color, texture, turgor are normal, no rashes or significant lesions EYES: normal, Conjunctiva are pink and non-injected, sclera  clear OROPHARYNX: no exudate, no erythema and lips, buccal mucosa, and tongue normal  NECK: supple, thyroid normal size, non-tender, without nodularity LYMPH: no palpable lymphadenopathy in the cervical, axillary or inguinal LUNGS: clear to auscultation and percussion with normal breathing effort HEART: regular rate & rhythm and no murmurs and no lower extremity edema ABDOMEN: abdomen soft, non-tender and normal bowel sounds MUSCULOSKELETAL: no cyanosis of digits and no clubbing  NEURO: alert & oriented x 3 with fluent speech, no focal motor/sensory deficits EXTREMITIES: No lower extremity edema  LABORATORY DATA:  I have reviewed the data as listed CMP Latest Ref Rng & Units 12/19/2018 11/06/2018 04/17/2007  Glucose 70 - 99 mg/dL 97 77 91  BUN 8 - 23 mg/dL 15 18 10   Creatinine 0.44 - 1.00 mg/dL 0.98 0.91 0.8  Sodium 135 - 145 mmol/L 139 139 140  Potassium 3.5 - 5.1 mmol/L 4.4 4.1 4.0  Chloride 98 - 111 mmol/L 103 104 103  CO2 22 - 32 mmol/L 28 25 29   Calcium 8.9 - 10.3 mg/dL 9.8 9.4 9.5  Total Protein 6.5 - 8.1 g/dL - 6.6 6.8  Total Bilirubin 0.3 - 1.2 mg/dL - 0.6 0.5  Alkaline Phos 38 - 126 U/L - 81 63  AST 15 - 41 U/L - 17 17  ALT 0 - 44 U/L - 13 15    Lab Results  Component Value Date   WBC 5.7 12/19/2018   HGB 13.8 12/19/2018   HCT 42.7 12/19/2018   MCV 89.7 12/19/2018   PLT 242 12/19/2018   NEUTROABS 3.6 11/06/2018    ASSESSMENT & PLAN:  Ductal carcinoma in situ (DCIS) of right breast 12/24/2018: Right lumpectomy by Dr. 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 I discussed the margins. We also discussed the final staging along with previously performed ER/PR testing.  Treatment plan: 1.  Adjuvant radiation therapy followed by 2.  Adjuvant antiestrogen therapy with tamoxifen x5 years  Return to clinic at the end of  radiation to start antiestrogen therapy.     No orders of the defined types were placed in this encounter.  The patient has a good understanding of the overall plan. she agrees with it. she will call with any problems that may develop before the next visit here.  Nicholas Lose, MD 01/01/2019  Julious Oka Dorshimer, am acting as scribe for Dr. Nicholas Lose.  I have reviewed the above documentation for accuracy and completeness, and I agree with the above.

## 2019-01-01 ENCOUNTER — Ambulatory Visit: Payer: Medicare Other | Admitting: Radiation Oncology

## 2019-01-01 ENCOUNTER — Inpatient Hospital Stay: Payer: Medicare Other | Attending: Hematology and Oncology | Admitting: Hematology and Oncology

## 2019-01-01 ENCOUNTER — Telehealth: Payer: Medicare Other

## 2019-01-01 ENCOUNTER — Other Ambulatory Visit: Payer: Self-pay

## 2019-01-01 DIAGNOSIS — Z170 Estrogen receptor positive status [ER+]: Secondary | ICD-10-CM | POA: Insufficient documentation

## 2019-01-01 DIAGNOSIS — D0511 Intraductal carcinoma in situ of right breast: Secondary | ICD-10-CM | POA: Diagnosis not present

## 2019-01-01 NOTE — Assessment & Plan Note (Signed)
12/24/2018: Right lumpectomy by Dr. 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 I discussed the margins. We also discussed the final staging along with previously performed ER/PR testing.  Treatment plan: 1.  Adjuvant radiation therapy followed by 2.  Adjuvant antiestrogen therapy with tamoxifen x5 years  Return to clinic at the end of radiation to start antiestrogen therapy.

## 2019-01-02 DIAGNOSIS — H524 Presbyopia: Secondary | ICD-10-CM | POA: Diagnosis not present

## 2019-01-02 DIAGNOSIS — H43811 Vitreous degeneration, right eye: Secondary | ICD-10-CM | POA: Diagnosis not present

## 2019-01-07 ENCOUNTER — Ambulatory Visit: Payer: Medicare Other | Admitting: Radiation Oncology

## 2019-01-07 ENCOUNTER — Ambulatory Visit: Payer: Medicare Other

## 2019-01-08 ENCOUNTER — Encounter: Payer: Self-pay | Admitting: Allergy

## 2019-01-08 ENCOUNTER — Ambulatory Visit: Payer: Medicare Other | Admitting: Allergy

## 2019-01-08 ENCOUNTER — Other Ambulatory Visit: Payer: Self-pay

## 2019-01-08 VITALS — BP 128/90 | HR 65 | Temp 97.3°F | Resp 16 | Ht 62.0 in | Wt 172.4 lb

## 2019-01-08 DIAGNOSIS — J3089 Other allergic rhinitis: Secondary | ICD-10-CM | POA: Diagnosis not present

## 2019-01-08 DIAGNOSIS — T781XXD Other adverse food reactions, not elsewhere classified, subsequent encounter: Secondary | ICD-10-CM | POA: Diagnosis not present

## 2019-01-08 DIAGNOSIS — J4520 Mild intermittent asthma, uncomplicated: Secondary | ICD-10-CM

## 2019-01-08 MED ORDER — ALBUTEROL SULFATE HFA 108 (90 BASE) MCG/ACT IN AERS: 2.0000 | INHALATION_SPRAY | RESPIRATORY_TRACT | 1 refills | Status: DC | PRN

## 2019-01-08 NOTE — Progress Notes (Signed)
Follow Up Note  RE: Kara Ramos MRN: [id number] DOB: 09-08-1950 Date of Office Visit: 01/08/2019  Referring provider: Vernie Shanks, MD Primary care provider: Vernie Shanks, MD  Chief Complaint: Food Intolerance  History of Present Illness: I had the pleasure of seeing Kara Ramos for a follow up visit at the [facility] on 01/08/2019. She is a 68 y.o. female, who is being followed for adverse food reaction, asthma and allergic rhinitis. Today she is here for regular follow up visit. Her previous allergy office visit was on 10/23/2018 with Dr. Maudie Mercury.   Adverse food reaction Patient kept a food diary for about 1 month. Wheat and onions are still the major triggers. Oysters caused some GI issues in the past.   Patient has been having a lot of stress lately as she has been diagnosed with breast cancer and she is status post surgery and will start radiation soon. Also her brother has cancer.   Mild intermittent asthma without complication Used albuterol once last week after they had some work done on [employer] traffic [employer].  Otherwise, denies any SOB, coughing, wheezing, chest tightness, nocturnal awakenings, ER/urgent care visits or prednisone use since the last visit.  Other allergic rhinitis Using azelastine nasal spray as needed a few times with good benefit. Not using Flonase or antihistamines.   Assessment and Plan: Kara Ramos is a 68 y.o. female with: Adverse food reaction Past history - Diagnosed with IBS and noticing worsening abdominal pains and diarrhea the last few years. Concerned for food allergies. Skin testing as a teenager was positive to milk, corn, wheat, grains, coffee, grapefruit, shrimp per patient report. H/o distal sigmoid resection. Follows with GI. 2020 skin testing showed: Borderline positive to wheat, shellfish mix, trout, oat, rye, onion and pineapple.  Interim history - kept a food journal for 1 month and wheat and onions are  definitely triggers for her.   Continue to avoid foods that bother you.   For mild symptoms you can take over the counter antihistamines such as Benadryl and monitor symptoms closely. If symptoms worsen or if you have severe symptoms including breathing issues, throat closure, significant swelling, whole body hives, severe diarrhea and vomiting, lightheadedness then seek immediate medical care.  Mild intermittent asthma without complication Past history - Used to have asthma as a teenager now only flares with URIs. 2020 spirometry was normal. Interim history - used albuterol once since last visit due to flare from strong chemicals.   May use albuterol rescue inhaler 2 puffs or nebulizer every 4 to 6 hours as needed for shortness of breath, chest tightness, coughing, and wheezing. May use albuterol rescue inhaler 2 puffs 5 to 15 minutes prior to strenuous physical activities. Monitor frequency of use.   Will get spirometry at next visit instead of today due to COVID-19 pandemic and trying to minimize any type of aerosolizing procedures at this time in the office.   Other allergic rhinitis Past history - Perennial rhinitis symptoms since teenage years.  Currently using Flonase and Xyzal with some benefit.  Last skin testing was over 50 years ago which showed multiple positives per patient report. Interim history - only using azelastine prn with good benefit.   May use azelastine nasal spray 1-2 sprays per nostril 1-2 times a day as needed for drainage.  May use over the counter antihistamines such as Zyrtec (cetirizine), Claritin (loratadine), Allegra (fexofenadine), or Xyzal (levocetirizine) daily as needed.  May use Flonase 1 spray 1-2 times  a day as needed for nasal congestion.   If symptoms worsen then consider environmental allergy testing in future.   Return in about 6 months (around 07/08/2019).  Meds ordered this encounter  Medications  . albuterol (PROAIR HFA) 108 (90 Base)  MCG/ACT inhaler    Sig: Inhale 2 puffs into the lungs every 4 (four) hours as needed for wheezing or shortness of breath (coughing).    Dispense:  18 g    Refill:  1   Diagnostics: None.  Medication List:  Current Outpatient Medications  Medication Sig Dispense Refill  . albuterol (PROAIR HFA) 108 (90 Base) MCG/ACT inhaler Inhale 2 puffs into the lungs every 4 (four) hours as needed for wheezing or shortness of breath (coughing). 18 g 1  . Azelastine HCl 0.15 % SOLN 1-2 sprays per nostril 1-2 times a day as needed for runny nose. (Patient taking differently: Place 1-2 sprays into both nostrils 2 (two) times daily as needed (runny nose). ) 30 mL 5  . Cranberry 500 MG TABS Take 500 mg by mouth daily.     Marland Kitchen ibuprofen (ADVIL) 200 MG tablet Take 400 mg by mouth every 6 (six) hours as needed for moderate pain.     . metoprolol succinate (TOPROL-XL) 25 MG 24 hr tablet Take 25 mg by mouth daily.    . Omeprazole (PRILOSEC PO) Take 20 mg by mouth daily.     . Polyethylene Glycol 400 (BLINK TEARS OP) Place 1 drop into both eyes 2 (two) times daily as needed (dry eyes).     . Probiotic Product (PROBIOTIC PO) Take 1 tablet by mouth daily.     No current facility-administered medications for this visit.    Allergies: Allergies  Allergen Reactions  . Azithromycin Rash and Other (See Comments)    Pt stated, "Lips turn numb and itching"   . Erythromycin Nausea And Vomiting and Rash  . Tape     Caused redness and peeling  . Tessalon [Benzonatate] Itching and Rash   I reviewed her past medical history, social history, family history, and environmental history and no significant changes have been reported from her previous visit.  Review of Systems  Constitutional: Negative for appetite change, chills, fever and unexpected weight change.  HENT: Negative for congestion and rhinorrhea.   Eyes: Negative for itching.  Respiratory: Negative for cough, chest tightness, shortness of breath and wheezing.    Cardiovascular: Negative for chest pain.  Gastrointestinal: Negative for abdominal pain.  Genitourinary: Negative for difficulty urinating.  Skin: Negative for rash.  Allergic/Immunologic: Positive for environmental allergies.  Neurological: Negative for headaches.   Objective: BP 128/90 (BP Location: Left Arm, Patient Position: Sitting, Cuff Size: Large)   Pulse 65   Temp (!) 97.3 F (36.3 C) (Temporal)   Resp 16   Ht 5\' 2"  (1.575 m)   Wt 172 lb 6.4 oz (78.2 kg)   SpO2 97%   BMI 31.53 kg/m  Body mass index is 31.53 kg/m. Physical Exam  Constitutional: She is oriented to person, place, and time. She appears well-developed and well-nourished.  HENT:  Head: Normocephalic and atraumatic.  Right Ear: External ear normal.  Left Ear: External ear normal.  Nose: Nose normal.  Mouth/Throat: Oropharynx is clear and moist.  Eyes: Conjunctivae and EOM are normal.  Neck: Neck supple.  Cardiovascular: Normal rate, regular rhythm and normal heart sounds. Exam reveals no gallop and no friction rub.  No murmur heard. Pulmonary/Chest: Effort normal and breath sounds normal. She has no  wheezes. She has no rales.  Abdominal: Soft.  Neurological: She is alert and oriented to person, place, and time.  Skin: Skin is warm. No rash noted.  Psychiatric: She has a normal mood and affect. Her behavior is normal.  Nursing note and vitals reviewed.  Previous notes and tests were reviewed. The plan was reviewed with the patient/family, and all questions/concerned were addressed.  It was my pleasure to see Kara Ramos today and participate in her care. Please feel free to contact me with any questions or concerns.  Sincerely,  Rexene Alberts, DO Allergy & Immunology  [facility] office: [phone] [facility] office: [place] office: [phone]

## 2019-01-08 NOTE — Assessment & Plan Note (Signed)
Past history - Perennial rhinitis symptoms since teenage years.  Currently using Flonase and Xyzal with some benefit.  Last skin testing was over 50 years ago which showed multiple positives per patient report. Interim history - only using azelastine prn with good benefit.   May use azelastine nasal spray 1-2 sprays per nostril 1-2 times a day as needed for drainage.  May use over the counter antihistamines such as Zyrtec (cetirizine), Claritin (loratadine), Allegra (fexofenadine), or Xyzal (levocetirizine) daily as needed.  May use Flonase 1 spray 1-2 times a day as needed for nasal congestion.   If symptoms worsen then consider environmental allergy testing in future.

## 2019-01-08 NOTE — Patient Instructions (Addendum)
Food   Past skin testing showed: Borderline positive to wheat, shellfish mix, trout, oat, rye, onion and pineapple.   Continue to avoid foods that bother you.   For mild symptoms you can take over the counter antihistamines such as Benadryl and monitor symptoms closely. If symptoms worsen or if you have severe symptoms including breathing issues, throat closure, significant swelling, whole body hives, severe diarrhea and vomiting, lightheadedness then seek immediate medical care.  Mild intermittent asthma without complication  May use albuterol rescue inhaler 2 puffs or nebulizer every 4 to 6 hours as needed for shortness of breath, chest tightness, coughing, and wheezing. May use albuterol rescue inhaler 2 puffs 5 to 15 minutes prior to strenuous physical activities. Monitor frequency of use.   Other allergic rhinitis  May use azelastine nasal spray 1-2 sprays per nostril 1-2 times a day as needed for drainage.  May use over the counter antihistamines such as Zyrtec (cetirizine), Claritin (loratadine), Allegra (fexofenadine), or Xyzal (levocetirizine) daily as needed.  May use Flonase 1 spray 1-2 times a day as needed for nasal congestion.   If symptoms worsen then consider environmental allergy testing in future.   Follow up in 6 months or sooner if needed. This may be done via telemedicine. Happy holidays and stay safe!

## 2019-01-08 NOTE — Assessment & Plan Note (Signed)
Past history - Diagnosed with IBS and noticing worsening abdominal pains and diarrhea the last few years. Concerned for food allergies. Skin testing as a teenager was positive to milk, corn, wheat, grains, coffee, grapefruit, shrimp per patient report. H/o distal sigmoid resection. Follows with GI. 2020 skin testing showed: Borderline positive to wheat, shellfish mix, trout, oat, rye, onion and pineapple.  Interim history - kept a food journal for 1 month and wheat and onions are definitely triggers for her.   Continue to avoid foods that bother you.   For mild symptoms you can take over the counter antihistamines such as Benadryl and monitor symptoms closely. If symptoms worsen or if you have severe symptoms including breathing issues, throat closure, significant swelling, whole body hives, severe diarrhea and vomiting, lightheadedness then seek immediate medical care.

## 2019-01-08 NOTE — Assessment & Plan Note (Signed)
Past history - Used to have asthma as a teenager now only flares with URIs. 2020 spirometry was normal. Interim history - used albuterol once since last visit due to flare from strong chemicals.   May use albuterol rescue inhaler 2 puffs or nebulizer every 4 to 6 hours as needed for shortness of breath, chest tightness, coughing, and wheezing. May use albuterol rescue inhaler 2 puffs 5 to 15 minutes prior to strenuous physical activities. Monitor frequency of use.   Will get spirometry at next visit instead of today due to COVID-19 pandemic and trying to minimize any type of aerosolizing procedures at this time in the office.

## 2019-01-14 ENCOUNTER — Ambulatory Visit
Admission: RE | Admit: 2019-01-14 | Discharge: 2019-01-14 | Disposition: A | Discharge status: Home / Self Care | Payer: Medicare Other | Source: Ambulatory Visit | Attending: Radiation Oncology | Admitting: Radiation Oncology

## 2019-01-14 ENCOUNTER — Other Ambulatory Visit: Payer: Self-pay

## 2019-01-14 DIAGNOSIS — Z510 Encounter for antineoplastic radiation therapy: Secondary | ICD-10-CM | POA: Insufficient documentation

## 2019-01-14 DIAGNOSIS — Z170 Estrogen receptor positive status [ER+]: Secondary | ICD-10-CM | POA: Insufficient documentation

## 2019-01-14 DIAGNOSIS — Z808 Family history of malignant neoplasm of other organs or systems: Secondary | ICD-10-CM | POA: Diagnosis not present

## 2019-01-14 DIAGNOSIS — D0511 Intraductal carcinoma in situ of right breast: Secondary | ICD-10-CM | POA: Insufficient documentation

## 2019-01-14 DIAGNOSIS — Z9889 Other specified postprocedural states: Secondary | ICD-10-CM | POA: Diagnosis not present

## 2019-01-14 HISTORY — DX: Malignant neoplasm of unspecified site of unspecified female breast: C50.919

## 2019-01-14 NOTE — Progress Notes (Signed)
Radiation Oncology         [phone]) [phone] ________________________________  Name: Kara Ramos        MRN: [id number]  Date of Service: 01/14/2019 DOB: 29-Nov-1950  BH:ALPF, Edwyna Shell, MD  Nicholas Lose, MD     REFERRING PHYSICIAN: Nicholas Lose, MD   DIAGNOSIS: The encounter diagnosis was Ductal carcinoma in situ (DCIS) of right breast.   HISTORY OF PRESENT ILLNESS: Kara Ramos is a 68 y.o. female originally seen in the multidisciplinary breast clinic for a new diagnosis of right breast cancer. The patient was noted to have screening detected calcifications in the medial right breast at about 3:00. The calcifications measured 1.6 x 1.4 x .7 cm, and the axilla was not evaluated by ultrasound. She underwent stereotactic biopsy on 10/29/2018 which revealed an intermediate grade DCIS that was ER/PR positive. She underwent lumpectomy on 12/24/2018 which revealed 1 cm intermediate grade DCIS. Her margins were clear, and she saw Dr. Barry Dienes yesterday and was cleared to proceed with radiation.   PREVIOUS RADIATION THERAPY: No   PAST MEDICAL HISTORY:  Past Medical History:  Diagnosis Date  . Allergy   . Arthritis   . Asthma   . Breast CA ([place])   . Cancer [facility]) 1978   cervical- had hysterectomy  . Complication of anesthesia    per patient 'one time was aware and awake during surgery and also BP can get really low"  . GERD (gastroesophageal reflux disease)   . History of IBS   . Irregular heart rhythm    occasional PAC'S, PVC's       PAST SURGICAL HISTORY: Past Surgical History:  Procedure Laterality Date  . ABDOMINAL HYSTERECTOMY     both ovaries removed  . APPENDECTOMY    . BREAST LUMPECTOMY WITH RADIOACTIVE SEED LOCALIZATION Right 12/24/2018   Procedure: RIGHT BREAST LUMPECTOMY WITH RADIOACTIVE SEED LOCALIZATION;  Surgeon: Stark Klein, MD;  Location: [place];  Service: General;  Laterality: Right;  . BUNIONECTOMY     both feet  . CATARACT EXTRACTION     right   . COLON SURGERY  1988   part of colon removed d/t endometriosis  . COLONOSCOPY  05/2017   One benign polyp  . CORRECTION HAMMER TOE  03/2017     FAMILY HISTORY:  Family History  Problem Relation Age of Onset  . Colon cancer Maternal Grandmother   . Heart disease Mother   . Brain cancer Father   . Heart disease Father   . Lung cancer Brother   . Heart disease Sister   . Cancer Paternal Grandmother   . Esophageal cancer Neg Hx   . Stomach cancer Neg Hx   . Rectal cancer Neg Hx      SOCIAL HISTORY:  reports that she quit smoking about 43 years ago. Her smoking use included cigarettes. She smoked 1.00 pack per day. She has never used smokeless tobacco. She reports current alcohol use of about 1.0 standard drinks of alcohol per week. She reports that she does not use drugs. The patient is widowed and lives in [place].   ALLERGIES: Azithromycin, Erythromycin, Tape, and Tessalon [benzonatate]   MEDICATIONS:  Current Outpatient Medications  Medication Sig Dispense Refill  . albuterol (PROAIR HFA) 108 (90 Base) MCG/ACT inhaler Inhale 2 puffs into the lungs every 4 (four) hours as needed for wheezing or shortness of breath (coughing). 18 g 1  . Azelastine HCl 0.15 % SOLN 1-2 sprays per nostril 1-2 times a day as needed for  runny nose. (Patient taking differently: Place 1-2 sprays into both nostrils 2 (two) times daily as needed (runny nose). ) 30 mL 5  . Cranberry 500 MG TABS Take 500 mg by mouth daily.     Marland Kitchen ibuprofen (ADVIL) 200 MG tablet Take 400 mg by mouth every 6 (six) hours as needed for moderate pain.     . metoprolol succinate (TOPROL-XL) 25 MG 24 hr tablet Take 25 mg by mouth daily.    . Omeprazole (PRILOSEC PO) Take 20 mg by mouth daily.     . Polyethylene Glycol 400 (BLINK TEARS OP) Place 1 drop into both eyes 2 (two) times daily as needed (dry eyes).     . Probiotic Product (PROBIOTIC PO) Take 1 tablet by mouth daily.     No current facility-administered medications  for this encounter.      REVIEW OF SYSTEMS: On review of systems, the patient reports that she is doing well overall. She feels like she's healed up nicely. She denies any chest pain, shortness of breath, cough, fevers, chills, night sweats, unintended weight changes. She denies any bowel or bladder disturbances, and denies abdominal pain, nausea or vomiting. She denies any new musculoskeletal or joint aches or pains. A complete review of systems is obtained and is otherwise negative.     PHYSICAL EXAM:  Wt Readings from Last 3 Encounters:  01/08/19 172 lb 6.4 oz (78.2 kg)  01/01/19 171 lb 8 oz (77.8 kg)  12/24/18 170 lb 2 oz (77.2 kg)   Temp Readings from Last 3 Encounters:  01/08/19 (!) 97.3 F (36.3 C) (Temporal)  01/01/19 97.8 F (36.6 C) (Temporal)  12/24/18 (!) 97 F (36.1 C)   BP Readings from Last 3 Encounters:  01/08/19 128/90  01/01/19 (!) 123/55  12/24/18 (!) 154/72   Pulse Readings from Last 3 Encounters:  01/08/19 65  01/01/19 65  12/24/18 80   In general this is a well appearing caucasian female in no acute distress. She's alert and oriented x4 and appropriate throughout the examination. Cardiopulmonary assessment is negative for acute distress and she exhibits normal effort.     ECOG = 0  0 - Asymptomatic (Fully active, able to carry on all predisease activities without restriction)  1 - Symptomatic but completely ambulatory (Restricted in physically strenuous activity but ambulatory and able to carry out work of a light or sedentary nature. For example, light housework, office work)  2 - Symptomatic, <50% in bed during the day (Ambulatory and capable of all self care but unable to carry out any work activities. Up and about more than 50% of waking hours)  3 - Symptomatic, >50% in bed, but not bedbound (Capable of only limited self-care, confined to bed or chair 50% or more of waking hours)  4 - Bedbound (Completely disabled. Cannot carry on any self-care.  Totally confined to bed or chair)  5 - Death   Eustace Pen MM, Creech RH, Tormey DC, et al. [phone]). "Toxicity and response criteria of the [facility]". [place] Oncol. 5 (6): 649-55    LABORATORY DATA:  Lab Results  Component Value Date   WBC 5.7 12/19/2018   HGB 13.8 12/19/2018   HCT 42.7 12/19/2018   MCV 89.7 12/19/2018   PLT 242 12/19/2018   Lab Results  Component Value Date   NA 139 12/19/2018   K 4.4 12/19/2018   CL 103 12/19/2018   CO2 28 12/19/2018   Lab Results  Component Value Date  ALT 13 11/06/2018   AST 17 11/06/2018   ALKPHOS 81 11/06/2018   BILITOT 0.6 11/06/2018      RADIOGRAPHY: Mm Breast Surgical Specimen  Result Date: 12/24/2018 CLINICAL DATA:  Evaluate specimen EXAM: SPECIMEN RADIOGRAPH OF THE RIGHT BREAST COMPARISON:  Previous exam(s). FINDINGS: Status post excision of the right breast. The radioactive seed and biopsy marker clip are present, completely intact, and were marked for pathology. IMPRESSION: Specimen radiograph of the right breast. Electronically Signed   By: Dorise Bullion III M.D   On: 12/24/2018 08:25   Mm Rt Radioactive Seed Loc Mammo Guide  Result Date: 12/23/2018 CLINICAL DATA:  Patient with RIGHT breast DCIS scheduled for lumpectomy requiring preoperative radioactive seed localization. EXAM: MAMMOGRAPHIC GUIDED RADIOACTIVE SEED LOCALIZATION OF THE RIGHT BREAST COMPARISON:  Previous exam(s). FINDINGS: Patient presents for radioactive seed localization prior to lumpectomy. I met with the patient and we discussed the procedure of seed localization including benefits and alternatives. We discussed the high likelihood of a successful procedure. We discussed the risks of the procedure including infection, bleeding, tissue injury and further surgery. We discussed the low dose of radioactivity involved in the procedure. Informed, written consent was given. The usual time-out protocol was performed immediately prior to the  procedure. Using mammographic guidance, sterile technique, 1% lidocaine and an I-125 radioactive seed, the coil shaped clip within the RIGHT breast was localized using a medial approach. The follow-up mammogram images confirm the seed in the expected location and were marked for Dr. Barry Dienes. Follow-up survey of the patient confirms presence of the radioactive seed. Order number of I-125 seed:  [id number]. Total activity:  0.623 millicuries reference Date: 12/09/2018 The patient tolerated the procedure well and was released from the [facility]. She was given instructions regarding seed removal. IMPRESSION: Radioactive seed localization right breast. No apparent complications. Electronically Signed   By: Franki Cabot M.D.   On: 12/23/2018 14:18       IMPRESSION/PLAN: 1. 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 Dr. Lisbeth Renshaw discusses the pathology findings and reviews the nature of non invasive breast disease. The patient is healing well and is ready to proceed. She would benefit from external radiotherapy to the breast followed by antiestrogen therapy to reduce risks of local and systemic recurrence. We discussed the risks, benefits, short, and long term effects of radiotherapy, and the patient is interested in proceeding. Dr. Lisbeth Renshaw discusses the delivery and logistics of radiotherapy and recommends 4 weeks of radiotherapy. She will come in this afternoon for simulation and will sign written consent and receive a copy at that time.  This encounter was provided by telemedicine platform MyChart.  The patient has given verbal consent for this type of encounter and has been advised to only accept a meeting of this type in a secure network environment. The time spent during this encounter was 30 minutes. The attendants for this meeting include  Dr. Lisbeth Renshaw, Hayden Pedro  and Lacretia Leigh.  During the encounter,  Dr. Lisbeth Renshaw, and Hayden Pedro were located at [facility] Radiation Oncology Department.  Jaci Page Leap was located at home.    The above documentation reflects my direct findings during this shared patient visit. Please see the separate note by Dr. Lisbeth Renshaw on this date for the remainder of the patient's plan of care.    Carola Rhine, PAC

## 2019-01-14 NOTE — Patient Instructions (Signed)
Coronavirus (COVID-19) Are you at risk?  Are you at risk for the Coronavirus (COVID-19)?  To be considered HIGH RISK for Coronavirus (COVID-19), you have to meet the following criteria:  . Traveled to [place], [place], [place], [place] or [place]; or in the [place] to [place], [place], [place], or [place]; and have fever, cough, and shortness of breath within the last 2 weeks of travel OR . Been in close contact with a person diagnosed with COVID-19 within the last 2 weeks and have fever, cough, and shortness of breath . IF YOU DO NOT MEET THESE CRITERIA, YOU ARE CONSIDERED LOW RISK FOR COVID-19.  What to do if you are HIGH RISK for COVID-19?  . If you are having a medical emergency, call 911. . Seek medical care right away. Before you go to a doctor's office, urgent care or emergency department, call ahead and tell them about your recent travel, contact with someone diagnosed with COVID-19, and your symptoms. You should receive instructions from your physician's office regarding next steps of care.  . When you arrive at healthcare provider, tell the healthcare staff immediately you have returned from visiting [place], [place], [place], [place] or [place]; or traveled in the [place] to [place], [place], [place], or [place]; in the last two weeks or you have been in close contact with a person diagnosed with COVID-19 in the last 2 weeks.   . Tell the health care staff about your symptoms: fever, cough and shortness of breath. . After you have been seen by a medical provider, you will be either: o Tested for (COVID-19) and discharged home on quarantine except to seek medical care if symptoms worsen, and asked to  - Stay home and avoid contact with others until you get your results (4-5 days)  - Avoid travel on public transportation if possible (such as bus, train, or airplane) or o Sent to the Emergency Department by EMS for evaluation, COVID-19 testing, and possible  admission depending on your condition and test results.  What to do if you are LOW RISK for COVID-19?  Reduce your risk of any infection by using the same precautions used for avoiding the common cold or flu:  . Wash your hands often with soap and warm water for at least 20 seconds.  If soap and water are not readily available, use an alcohol-based hand sanitizer with at least 60% alcohol.  . If coughing or sneezing, cover your mouth and nose by coughing or sneezing into the elbow areas of your shirt or coat, into a tissue or into your sleeve (not your hands). . Avoid shaking hands with others and consider head nods or verbal greetings only. . Avoid touching your eyes, nose, or mouth with unwashed hands.  . Avoid close contact with people who are sick. . Avoid places or events with large numbers of people in one location, like concerts or sporting events. . Carefully consider travel plans you have or are making. . If you are planning any travel outside or inside the [place], visit the CDC's Travelers' Health webpage for the latest health notices. . If you have some symptoms but not all symptoms, continue to monitor at home and seek medical attention if your symptoms worsen. . If you are having a medical emergency, call 911.   ADDITIONAL HEALTHCARE OPTIONS FOR PATIENTS  [facility] / e-Visit: [email or web address]         [facility]: [phone]  [facility]: [phone]                   [facility]: [phone]   

## 2019-01-14 NOTE — Progress Notes (Signed)
Spoke with patient via my chart. Questions answered and concerns addressed. Meaningful use complete

## 2019-01-15 NOTE — Progress Notes (Signed)
  Radiation Oncology         [phone]) [phone] ________________________________  Name: Kara Ramos MRN: [id number]  Date: 01/14/2019  DOB: [birth year]  Optical Surface Tracking Plan:  Since intensity modulated radiotherapy (IMRT) and 3D conformal radiation treatment methods are predicated on accurate and precise positioning for treatment, intrafraction motion monitoring is medically necessary to ensure accurate and safe treatment delivery.  The ability to quantify intrafraction motion without excessive ionizing radiation dose can only be performed with optical surface tracking. Accordingly, surface imaging offers the opportunity to obtain 3D measurements of patient position throughout IMRT and 3D treatments without excessive radiation exposure.  I am ordering optical surface tracking for this patient's upcoming course of radiotherapy. ________________________________  Kyung Rudd, MD 01/15/2019 9:19 AM    Reference:   Particia Jasper, et al. Surface imaging-based analysis of intrafraction motion for breast radiotherapy patients.Journal of [place], n. 6, nov. 2014. ISSN [id number].   Available at: <http://www.jacmp.org/index.php/jacmp/article/view/4957>.

## 2019-01-15 NOTE — Progress Notes (Signed)
  Radiation Oncology         [phone] ________________________________  Name: Kara Ramos MRN: [id number]  Date: 01/14/2019  DOB: October 29, 1950   DIAGNOSIS:     ICD-10-CM   1. Ductal carcinoma in situ (DCIS) of right breast  D05.11     SIMULATION AND TREATMENT PLANNING NOTE  The patient presented for simulation prior to beginning her course of radiation treatment for her diagnosis of right-sided breast cancer. The patient was placed in a supine position on a breast board. A customized vac-lock bag was constructed and this complex treatment device will be used on a daily basis during her treatment. In this fashion, a CT scan was obtained through the chest area and an isocenter was placed near the chest wall within the breast.  The patient will be planned to receive a course of radiation initially to a dose of 42.56 Gy. This will consist of a whole breast radiotherapy technique. To accomplish this, 2 customized blocks have been designed which will correspond to medial and lateral whole breast tangent fields. This treatment will be accomplished at 2.66 Gy per fraction. A forward planning technique will also be evaluated to determine if this approach improves the plan. It is anticipated that the patient will then receive a 8 Gy boost to the seroma cavity which has been contoured. This will be accomplished at 2 Gy per fraction.   This initial treatment will consist of a 3-D conformal technique. The seroma has been contoured as the primary target structure. Additionally, dose volume histograms of both this target as well as the lungs and heart will also be evaluated. Such an approach is necessary to ensure that the target area is adequately covered while the nearby critical  normal structures are adequately spared.  Plan:  The final anticipated total dose therefore will correspond to 50.56 Gy.    _______________________________   Jodelle Gross, MD, PhD

## 2019-01-16 ENCOUNTER — Encounter: Payer: Self-pay | Admitting: *Deleted

## 2019-01-17 DIAGNOSIS — Z170 Estrogen receptor positive status [ER+]: Secondary | ICD-10-CM | POA: Diagnosis not present

## 2019-01-17 DIAGNOSIS — D0511 Intraductal carcinoma in situ of right breast: Secondary | ICD-10-CM | POA: Diagnosis not present

## 2019-01-17 DIAGNOSIS — Z510 Encounter for antineoplastic radiation therapy: Secondary | ICD-10-CM | POA: Diagnosis not present

## 2019-01-20 ENCOUNTER — Other Ambulatory Visit: Payer: Self-pay

## 2019-01-20 ENCOUNTER — Telehealth: Payer: Self-pay | Admitting: Hematology and Oncology

## 2019-01-20 ENCOUNTER — Ambulatory Visit
Admission: RE | Admit: 2019-01-20 | Discharge: 2019-01-20 | Disposition: A | Discharge status: Home / Self Care | Payer: Medicare Other | Source: Ambulatory Visit | Attending: Radiation Oncology | Admitting: Radiation Oncology

## 2019-01-20 DIAGNOSIS — Z510 Encounter for antineoplastic radiation therapy: Secondary | ICD-10-CM | POA: Diagnosis not present

## 2019-01-20 DIAGNOSIS — D0511 Intraductal carcinoma in situ of right breast: Secondary | ICD-10-CM | POA: Diagnosis not present

## 2019-01-20 DIAGNOSIS — Z170 Estrogen receptor positive status [ER+]: Secondary | ICD-10-CM | POA: Diagnosis not present

## 2019-01-20 NOTE — Telephone Encounter (Signed)
Scheduled appt per 1/23 sch message - pt is aware of appt date and time  

## 2019-01-21 ENCOUNTER — Ambulatory Visit
Admission: RE | Admit: 2019-01-21 | Discharge: 2019-01-21 | Disposition: A | Discharge status: Home / Self Care | Payer: Medicare Other | Source: Ambulatory Visit | Attending: Radiation Oncology | Admitting: Radiation Oncology

## 2019-01-21 DIAGNOSIS — D0511 Intraductal carcinoma in situ of right breast: Secondary | ICD-10-CM | POA: Diagnosis not present

## 2019-01-21 DIAGNOSIS — Z170 Estrogen receptor positive status [ER+]: Secondary | ICD-10-CM | POA: Diagnosis not present

## 2019-01-21 DIAGNOSIS — Z510 Encounter for antineoplastic radiation therapy: Secondary | ICD-10-CM | POA: Diagnosis not present

## 2019-01-22 ENCOUNTER — Ambulatory Visit
Admission: RE | Admit: 2019-01-22 | Discharge: 2019-01-22 | Disposition: A | Discharge status: Home / Self Care | Payer: Medicare Other | Source: Ambulatory Visit | Attending: Radiation Oncology | Admitting: Radiation Oncology

## 2019-01-22 ENCOUNTER — Other Ambulatory Visit: Payer: Self-pay

## 2019-01-22 DIAGNOSIS — D0511 Intraductal carcinoma in situ of right breast: Secondary | ICD-10-CM | POA: Diagnosis not present

## 2019-01-22 DIAGNOSIS — Z170 Estrogen receptor positive status [ER+]: Secondary | ICD-10-CM | POA: Diagnosis not present

## 2019-01-22 DIAGNOSIS — Z510 Encounter for antineoplastic radiation therapy: Secondary | ICD-10-CM | POA: Diagnosis not present

## 2019-01-23 ENCOUNTER — Ambulatory Visit
Admission: RE | Admit: 2019-01-23 | Discharge: 2019-01-23 | Disposition: A | Discharge status: Home / Self Care | Payer: Medicare Other | Source: Ambulatory Visit | Attending: Radiation Oncology | Admitting: Radiation Oncology

## 2019-01-23 ENCOUNTER — Other Ambulatory Visit: Payer: Self-pay

## 2019-01-23 DIAGNOSIS — D0511 Intraductal carcinoma in situ of right breast: Secondary | ICD-10-CM | POA: Diagnosis not present

## 2019-01-23 DIAGNOSIS — Z170 Estrogen receptor positive status [ER+]: Secondary | ICD-10-CM | POA: Diagnosis not present

## 2019-01-23 DIAGNOSIS — Z86018 Personal history of other benign neoplasm: Secondary | ICD-10-CM | POA: Diagnosis not present

## 2019-01-23 DIAGNOSIS — D224 Melanocytic nevi of scalp and neck: Secondary | ICD-10-CM | POA: Diagnosis not present

## 2019-01-23 DIAGNOSIS — D2372 Other benign neoplasm of skin of left lower limb, including hip: Secondary | ICD-10-CM | POA: Diagnosis not present

## 2019-01-23 DIAGNOSIS — Z510 Encounter for antineoplastic radiation therapy: Secondary | ICD-10-CM | POA: Diagnosis not present

## 2019-01-23 DIAGNOSIS — Z23 Encounter for immunization: Secondary | ICD-10-CM | POA: Diagnosis not present

## 2019-01-24 ENCOUNTER — Other Ambulatory Visit: Payer: Self-pay

## 2019-01-24 ENCOUNTER — Ambulatory Visit
Admission: RE | Admit: 2019-01-24 | Discharge: 2019-01-24 | Disposition: A | Discharge status: Home / Self Care | Payer: Medicare Other | Source: Ambulatory Visit | Attending: Radiation Oncology | Admitting: Radiation Oncology

## 2019-01-24 DIAGNOSIS — D0511 Intraductal carcinoma in situ of right breast: Secondary | ICD-10-CM

## 2019-01-24 DIAGNOSIS — Z170 Estrogen receptor positive status [ER+]: Secondary | ICD-10-CM | POA: Diagnosis not present

## 2019-01-24 DIAGNOSIS — Z510 Encounter for antineoplastic radiation therapy: Secondary | ICD-10-CM | POA: Diagnosis not present

## 2019-01-24 MED ORDER — ALRA NON-METALLIC DEODORANT (RAD-ONC)
1.0000 "application " | Freq: Once | TOPICAL | Status: AC
  Administered 2019-01-24: 1 via TOPICAL

## 2019-01-24 MED ORDER — SONAFINE EX EMUL
1.0000 "application " | Freq: Two times a day (BID) | CUTANEOUS | Status: DC
  Administered 2019-01-24: 1 via TOPICAL

## 2019-01-27 ENCOUNTER — Ambulatory Visit
Admission: RE | Admit: 2019-01-27 | Discharge: 2019-01-27 | Disposition: A | Discharge status: Home / Self Care | Payer: Medicare Other | Source: Ambulatory Visit | Attending: Radiation Oncology | Admitting: Radiation Oncology

## 2019-01-27 ENCOUNTER — Other Ambulatory Visit: Payer: Self-pay

## 2019-01-27 DIAGNOSIS — Z170 Estrogen receptor positive status [ER+]: Secondary | ICD-10-CM | POA: Diagnosis not present

## 2019-01-27 DIAGNOSIS — Z510 Encounter for antineoplastic radiation therapy: Secondary | ICD-10-CM | POA: Diagnosis not present

## 2019-01-27 DIAGNOSIS — D0511 Intraductal carcinoma in situ of right breast: Secondary | ICD-10-CM | POA: Diagnosis not present

## 2019-01-28 ENCOUNTER — Other Ambulatory Visit: Payer: Self-pay

## 2019-01-28 ENCOUNTER — Ambulatory Visit
Admission: RE | Admit: 2019-01-28 | Discharge: 2019-01-28 | Disposition: A | Discharge status: Home / Self Care | Payer: Medicare Other | Source: Ambulatory Visit | Attending: Radiation Oncology | Admitting: Radiation Oncology

## 2019-01-28 DIAGNOSIS — D0511 Intraductal carcinoma in situ of right breast: Secondary | ICD-10-CM | POA: Diagnosis not present

## 2019-01-28 DIAGNOSIS — Z170 Estrogen receptor positive status [ER+]: Secondary | ICD-10-CM | POA: Diagnosis not present

## 2019-01-28 DIAGNOSIS — Z510 Encounter for antineoplastic radiation therapy: Secondary | ICD-10-CM | POA: Diagnosis not present

## 2019-01-29 ENCOUNTER — Ambulatory Visit
Admission: RE | Admit: 2019-01-29 | Discharge: 2019-01-29 | Disposition: A | Discharge status: Home / Self Care | Payer: Medicare Other | Source: Ambulatory Visit | Attending: Radiation Oncology | Admitting: Radiation Oncology

## 2019-01-29 ENCOUNTER — Other Ambulatory Visit: Payer: Self-pay

## 2019-01-29 DIAGNOSIS — D0511 Intraductal carcinoma in situ of right breast: Secondary | ICD-10-CM | POA: Diagnosis not present

## 2019-01-29 DIAGNOSIS — Z170 Estrogen receptor positive status [ER+]: Secondary | ICD-10-CM | POA: Diagnosis not present

## 2019-01-29 DIAGNOSIS — Z510 Encounter for antineoplastic radiation therapy: Secondary | ICD-10-CM | POA: Diagnosis not present

## 2019-01-30 ENCOUNTER — Other Ambulatory Visit: Payer: Self-pay

## 2019-01-30 ENCOUNTER — Ambulatory Visit
Admission: RE | Admit: 2019-01-30 | Discharge: 2019-01-30 | Disposition: A | Discharge status: Home / Self Care | Payer: Medicare Other | Source: Ambulatory Visit | Attending: Radiation Oncology | Admitting: Radiation Oncology

## 2019-01-30 DIAGNOSIS — Z510 Encounter for antineoplastic radiation therapy: Secondary | ICD-10-CM | POA: Diagnosis not present

## 2019-01-30 DIAGNOSIS — D0511 Intraductal carcinoma in situ of right breast: Secondary | ICD-10-CM | POA: Diagnosis not present

## 2019-01-30 DIAGNOSIS — Z170 Estrogen receptor positive status [ER+]: Secondary | ICD-10-CM | POA: Diagnosis not present

## 2019-01-31 ENCOUNTER — Other Ambulatory Visit: Payer: Self-pay

## 2019-01-31 ENCOUNTER — Ambulatory Visit
Admission: RE | Admit: 2019-01-31 | Discharge: 2019-01-31 | Disposition: A | Discharge status: Home / Self Care | Payer: Medicare Other | Source: Ambulatory Visit | Attending: Radiation Oncology | Admitting: Radiation Oncology

## 2019-01-31 DIAGNOSIS — Z170 Estrogen receptor positive status [ER+]: Secondary | ICD-10-CM | POA: Diagnosis not present

## 2019-01-31 DIAGNOSIS — Z510 Encounter for antineoplastic radiation therapy: Secondary | ICD-10-CM | POA: Diagnosis not present

## 2019-01-31 DIAGNOSIS — D0511 Intraductal carcinoma in situ of right breast: Secondary | ICD-10-CM | POA: Diagnosis not present

## 2019-02-03 ENCOUNTER — Other Ambulatory Visit: Payer: Self-pay

## 2019-02-03 ENCOUNTER — Ambulatory Visit
Admission: RE | Admit: 2019-02-03 | Discharge: 2019-02-03 | Disposition: A | Discharge status: Home / Self Care | Payer: Medicare Other | Source: Ambulatory Visit | Attending: Radiation Oncology | Admitting: Radiation Oncology

## 2019-02-03 DIAGNOSIS — Z510 Encounter for antineoplastic radiation therapy: Secondary | ICD-10-CM | POA: Diagnosis not present

## 2019-02-03 DIAGNOSIS — D0511 Intraductal carcinoma in situ of right breast: Secondary | ICD-10-CM | POA: Diagnosis not present

## 2019-02-03 DIAGNOSIS — Z170 Estrogen receptor positive status [ER+]: Secondary | ICD-10-CM | POA: Diagnosis not present

## 2019-02-04 ENCOUNTER — Ambulatory Visit
Admission: RE | Admit: 2019-02-04 | Discharge: 2019-02-04 | Disposition: A | Discharge status: Home / Self Care | Payer: Medicare Other | Source: Ambulatory Visit | Attending: Radiation Oncology | Admitting: Radiation Oncology

## 2019-02-04 ENCOUNTER — Other Ambulatory Visit: Payer: Self-pay

## 2019-02-04 DIAGNOSIS — Z510 Encounter for antineoplastic radiation therapy: Secondary | ICD-10-CM | POA: Diagnosis not present

## 2019-02-04 DIAGNOSIS — D0511 Intraductal carcinoma in situ of right breast: Secondary | ICD-10-CM | POA: Diagnosis not present

## 2019-02-04 DIAGNOSIS — Z170 Estrogen receptor positive status [ER+]: Secondary | ICD-10-CM | POA: Diagnosis not present

## 2019-02-05 ENCOUNTER — Other Ambulatory Visit: Payer: Self-pay

## 2019-02-05 ENCOUNTER — Ambulatory Visit
Admission: RE | Admit: 2019-02-05 | Discharge: 2019-02-05 | Disposition: A | Discharge status: Home / Self Care | Payer: Medicare Other | Source: Ambulatory Visit | Attending: Radiation Oncology | Admitting: Radiation Oncology

## 2019-02-05 DIAGNOSIS — Z510 Encounter for antineoplastic radiation therapy: Secondary | ICD-10-CM | POA: Diagnosis not present

## 2019-02-05 DIAGNOSIS — D0511 Intraductal carcinoma in situ of right breast: Secondary | ICD-10-CM | POA: Diagnosis not present

## 2019-02-05 DIAGNOSIS — Z170 Estrogen receptor positive status [ER+]: Secondary | ICD-10-CM | POA: Diagnosis not present

## 2019-02-06 ENCOUNTER — Ambulatory Visit: Payer: Medicare Other | Admitting: Radiation Oncology

## 2019-02-06 ENCOUNTER — Other Ambulatory Visit: Payer: Self-pay

## 2019-02-06 ENCOUNTER — Ambulatory Visit
Admission: RE | Admit: 2019-02-06 | Discharge: 2019-02-06 | Disposition: A | Discharge status: Home / Self Care | Payer: Medicare Other | Source: Ambulatory Visit | Attending: Radiation Oncology | Admitting: Radiation Oncology

## 2019-02-06 DIAGNOSIS — D0511 Intraductal carcinoma in situ of right breast: Secondary | ICD-10-CM | POA: Diagnosis not present

## 2019-02-06 DIAGNOSIS — Z510 Encounter for antineoplastic radiation therapy: Secondary | ICD-10-CM | POA: Diagnosis not present

## 2019-02-06 DIAGNOSIS — Z170 Estrogen receptor positive status [ER+]: Secondary | ICD-10-CM | POA: Diagnosis not present

## 2019-02-06 MED ORDER — SONAFINE EX EMUL
1.0000 "application " | CUTANEOUS | Status: AC
  Administered 2019-02-06: 1 via TOPICAL

## 2019-02-10 ENCOUNTER — Other Ambulatory Visit: Payer: Self-pay | Admitting: Cardiology

## 2019-02-10 ENCOUNTER — Ambulatory Visit
Admission: RE | Admit: 2019-02-10 | Discharge: 2019-02-10 | Disposition: A | Discharge status: Home / Self Care | Payer: Medicare Other | Source: Ambulatory Visit | Attending: Radiation Oncology | Admitting: Radiation Oncology

## 2019-02-10 ENCOUNTER — Other Ambulatory Visit: Payer: Self-pay

## 2019-02-10 DIAGNOSIS — Z510 Encounter for antineoplastic radiation therapy: Secondary | ICD-10-CM | POA: Diagnosis not present

## 2019-02-10 DIAGNOSIS — Z170 Estrogen receptor positive status [ER+]: Secondary | ICD-10-CM | POA: Diagnosis not present

## 2019-02-10 DIAGNOSIS — D0511 Intraductal carcinoma in situ of right breast: Secondary | ICD-10-CM | POA: Diagnosis not present

## 2019-02-11 ENCOUNTER — Other Ambulatory Visit: Payer: Self-pay

## 2019-02-11 ENCOUNTER — Ambulatory Visit
Admission: RE | Admit: 2019-02-11 | Discharge: 2019-02-11 | Disposition: A | Discharge status: Home / Self Care | Payer: Medicare Other | Source: Ambulatory Visit | Attending: Radiation Oncology | Admitting: Radiation Oncology

## 2019-02-11 DIAGNOSIS — D0511 Intraductal carcinoma in situ of right breast: Secondary | ICD-10-CM | POA: Diagnosis not present

## 2019-02-11 DIAGNOSIS — Z510 Encounter for antineoplastic radiation therapy: Secondary | ICD-10-CM | POA: Diagnosis not present

## 2019-02-11 DIAGNOSIS — Z170 Estrogen receptor positive status [ER+]: Secondary | ICD-10-CM | POA: Diagnosis not present

## 2019-02-12 ENCOUNTER — Other Ambulatory Visit: Payer: Self-pay

## 2019-02-12 ENCOUNTER — Ambulatory Visit
Admission: RE | Admit: 2019-02-12 | Discharge: 2019-02-12 | Disposition: A | Discharge status: Home / Self Care | Payer: Medicare Other | Source: Ambulatory Visit | Attending: Radiation Oncology | Admitting: Radiation Oncology

## 2019-02-12 DIAGNOSIS — D0511 Intraductal carcinoma in situ of right breast: Secondary | ICD-10-CM | POA: Diagnosis not present

## 2019-02-12 DIAGNOSIS — Z510 Encounter for antineoplastic radiation therapy: Secondary | ICD-10-CM | POA: Diagnosis not present

## 2019-02-12 DIAGNOSIS — Z170 Estrogen receptor positive status [ER+]: Secondary | ICD-10-CM | POA: Diagnosis not present

## 2019-02-13 ENCOUNTER — Other Ambulatory Visit: Payer: Self-pay

## 2019-02-13 ENCOUNTER — Ambulatory Visit
Admission: RE | Admit: 2019-02-13 | Discharge: 2019-02-13 | Disposition: A | Discharge status: Home / Self Care | Payer: Medicare Other | Source: Ambulatory Visit | Attending: Radiation Oncology | Admitting: Radiation Oncology

## 2019-02-13 DIAGNOSIS — Z170 Estrogen receptor positive status [ER+]: Secondary | ICD-10-CM | POA: Diagnosis not present

## 2019-02-13 DIAGNOSIS — D0511 Intraductal carcinoma in situ of right breast: Secondary | ICD-10-CM | POA: Diagnosis not present

## 2019-02-13 DIAGNOSIS — Z510 Encounter for antineoplastic radiation therapy: Secondary | ICD-10-CM | POA: Diagnosis not present

## 2019-02-17 ENCOUNTER — Other Ambulatory Visit: Payer: Self-pay

## 2019-02-17 ENCOUNTER — Ambulatory Visit
Admission: RE | Admit: 2019-02-17 | Discharge: 2019-02-17 | Disposition: A | Discharge status: Home / Self Care | Payer: Medicare Other | Source: Ambulatory Visit | Attending: Radiation Oncology | Admitting: Radiation Oncology

## 2019-02-17 DIAGNOSIS — Z510 Encounter for antineoplastic radiation therapy: Secondary | ICD-10-CM | POA: Diagnosis not present

## 2019-02-17 DIAGNOSIS — D0511 Intraductal carcinoma in situ of right breast: Secondary | ICD-10-CM | POA: Insufficient documentation

## 2019-02-17 NOTE — Progress Notes (Signed)
Patient Care Team: Vernie Shanks, MD as PCP - General (Family Medicine) Mauro Kaufmann, RN as Oncology Nurse Navigator Rockwell Germany, RN as Oncology Nurse Navigator Nicholas Lose, MD as Consulting Physician (Hematology and Oncology) Kyung Rudd, MD as Consulting Physician (Radiation Oncology) Stark Klein, MD as Consulting Physician (General Surgery)  DIAGNOSIS:    ICD-10-CM   1. Ductal carcinoma in situ (DCIS) of right breast  D05.11     SUMMARY OF ONCOLOGIC HISTORY: Oncology History  Ductal carcinoma in situ (DCIS) of right breast  11/01/2018 Initial Diagnosis   Routine screening mammogram detected calcifications in the LIQ spanning 1.6cm. 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    12/24/2018 Surgery   Right lumpectomy [facility]): DCIS, intermediate grade, 1.0cm, clear margins.   01/21/2019 -  Radiation Therapy   Adjuvant radiation therapy     CHIEF COMPLIANT: Follow-up to discuss antiestrogen therapy  INTERVAL HISTORY: Kara Ramos is a 69 y.o. with above-mentioned history of right breast DCIS who underwent a lumpectomy and is currently on radiation treatment. She presents to the clinic today to discuss antiestrogen therapy.    I have reviewed the past medical history, past surgical history, social history and family history with the patient and they are unchanged from previous note.  ALLERGIES:  is allergic to azithromycin; erythromycin; tape; and tessalon [benzonatate].  MEDICATIONS:  Current Outpatient Medications  Medication Sig Dispense Refill  . albuterol (PROAIR HFA) 108 (90 Base) MCG/ACT inhaler Inhale 2 puffs into the lungs every 4 (four) hours as needed for wheezing or shortness of breath (coughing). 18 g 1  . Azelastine HCl 0.15 % SOLN 1-2 sprays per nostril 1-2 times a day as needed for runny nose. (Patient taking differently: Place 1-2 sprays into both nostrils 2 (two) times daily as needed (runny nose). ) 30  mL 5  . Cranberry 500 MG TABS Take 500 mg by mouth daily.     Marland Kitchen ibuprofen (ADVIL) 200 MG tablet Take 400 mg by mouth every 6 (six) hours as needed for moderate pain.     . metoprolol succinate (TOPROL-XL) 25 MG 24 hr tablet TAKE 1 TABLET BY MOUTH EVERY DAY 60 tablet 3  . Omeprazole (PRILOSEC PO) Take 20 mg by mouth daily.     . Polyethylene Glycol 400 (BLINK TEARS OP) Place 1 drop into both eyes 2 (two) times daily as needed (dry eyes).     . Probiotic Product (PROBIOTIC PO) Take 1 tablet by mouth daily.     No current facility-administered medications for this visit.    PHYSICAL EXAMINATION: ECOG PERFORMANCE STATUS: 1 - Symptomatic but completely ambulatory  Vitals:   02/18/19 0834  BP: (!) 110/44  Pulse: 68  Resp: 17  Temp: (!) 97.3 F (36.3 C)  SpO2: 100%   Filed Weights   02/18/19 0834  Weight: 173 lb 9.6 oz (78.7 kg)    LABORATORY DATA:  I have reviewed the data as listed CMP Latest Ref Rng & Units 12/19/2018 11/06/2018 04/17/2007  Glucose 70 - 99 mg/dL 97 77 91  BUN 8 - 23 mg/dL 15 18 10   Creatinine 0.44 - 1.00 mg/dL 0.98 0.91 0.8  Sodium 135 - 145 mmol/L 139 139 140  Potassium 3.5 - 5.1 mmol/L 4.4 4.1 4.0  Chloride 98 - 111 mmol/L 103 104 103  CO2 22 - 32 mmol/L 28 25 29   Calcium 8.9 - 10.3 mg/dL 9.8 9.4 9.5  Total Protein 6.5 - 8.1 g/dL -  6.6 6.8  Total Bilirubin 0.3 - 1.2 mg/dL - 0.6 0.5  Alkaline Phos 38 - 126 U/L - 81 63  AST 15 - 41 U/L - 17 17  ALT 0 - 44 U/L - 13 15    Lab Results  Component Value Date   WBC 5.7 12/19/2018   HGB 13.8 12/19/2018   HCT 42.7 12/19/2018   MCV 89.7 12/19/2018   PLT 242 12/19/2018   NEUTROABS 3.6 11/06/2018    ASSESSMENT & PLAN:  Ductal carcinoma in situ (DCIS) of right breast 12/24/2018: Right lumpectomy by Dr. Barry Dienes: Intermediate grade DCIS, 1 cm, margins clear, ER 95%, PR 60%, Tis NX stage 0  Treatment plan: 1.  Adjuvant radiation therapy 01/21/2019-02/18/2019 2.  Adjuvant antiestrogen therapy with tamoxifen x5  years.  03/05/2019  Tamoxifen counseling:We discussed the risks and benefits of tamoxifen. These include but not limited to insomnia, hot flashes, mood changes, vaginal dryness, and weight gain. Although rare, serious side effects including endometrial cancer, risk of blood clots were also discussed. We strongly believe that the benefits far outweigh the risks. Patient understands these risks and consented to starting treatment. Planned treatment duration is 5 years.  Return to clinic in 3 months for survivorship care plan visit    No orders of the defined types were placed in this encounter.  The patient has a good understanding of the overall plan. she agrees with it. she will call with any problems that may develop before the next visit here.  Total time spent: 30 mins including face to face time and time spent for planning, charting and coordination of care  Nicholas Lose, MD 02/18/2019  I, Cloyde Reams Dorshimer, am acting as scribe for Dr. Nicholas Lose.  I have reviewed the above document for accuracy and completeness, and I agree with the above.

## 2019-02-18 ENCOUNTER — Encounter: Payer: Self-pay | Admitting: *Deleted

## 2019-02-18 ENCOUNTER — Inpatient Hospital Stay: Payer: Medicare Other | Attending: Hematology and Oncology | Admitting: Hematology and Oncology

## 2019-02-18 ENCOUNTER — Ambulatory Visit
Admission: RE | Admit: 2019-02-18 | Discharge: 2019-02-18 | Disposition: A | Discharge status: Home / Self Care | Payer: Medicare Other | Source: Ambulatory Visit | Attending: Radiation Oncology | Admitting: Radiation Oncology

## 2019-02-18 ENCOUNTER — Other Ambulatory Visit: Payer: Self-pay

## 2019-02-18 ENCOUNTER — Encounter: Payer: Self-pay | Admitting: Radiation Oncology

## 2019-02-18 DIAGNOSIS — Z170 Estrogen receptor positive status [ER+]: Secondary | ICD-10-CM | POA: Diagnosis not present

## 2019-02-18 DIAGNOSIS — Z510 Encounter for antineoplastic radiation therapy: Secondary | ICD-10-CM | POA: Diagnosis not present

## 2019-02-18 DIAGNOSIS — D0511 Intraductal carcinoma in situ of right breast: Secondary | ICD-10-CM | POA: Diagnosis not present

## 2019-02-18 MED ORDER — TAMOXIFEN CITRATE 20 MG PO TABS: 20.0000 mg | ORAL_TABLET | Freq: Every day | ORAL | 3 refills | Status: DC

## 2019-02-18 NOTE — Assessment & Plan Note (Signed)
12/24/2018: Right lumpectomy by Dr. Barry Dienes: Intermediate grade DCIS, 1 cm, margins clear, ER 95%, PR 60%, Tis NX stage 0  Treatment plan: 1.  Adjuvant radiation therapy 01/21/2019-02/18/2019 2.  Adjuvant antiestrogen therapy with tamoxifen x5 years.  03/05/2019  Tamoxifen counseling:We discussed the risks and benefits of tamoxifen. These include but not limited to insomnia, hot flashes, mood changes, vaginal dryness, and weight gain. Although rare, serious side effects including endometrial cancer, risk of blood clots were also discussed. We strongly believe that the benefits far outweigh the risks. Patient understands these risks and consented to starting treatment. Planned treatment duration is 5 years.  Return to clinic in 3 months for survivorship care plan visit

## 2019-02-19 ENCOUNTER — Telehealth: Payer: Self-pay | Admitting: Adult Health

## 2019-02-19 NOTE — Telephone Encounter (Signed)
I talk with patient regarding schedule  

## 2019-03-12 ENCOUNTER — Telehealth: Payer: Self-pay | Admitting: Radiation Oncology

## 2019-03-12 NOTE — Telephone Encounter (Signed)
  Radiation Oncology         [phone]) [phone] ________________________________  Name: Kara Ramos MRN: [id number]  Date of Service: 03/12/2019  DOB: 18-Oct-1950  Post Treatment Telephone Note  Diagnosis:  ER/PR positive intermediate grade DCIS of the right breast.  Interval Since Last Radiation:  3 weeks   01/20/2019-02/18/2019: The right breast was treated to 42.56 Gy in 16 fractions followed by an 8 Gy boost to the surgical cavity in 4 fractions  Narrative:  The patient was contacted today for routine follow-up. During treatment she did very well with radiotherapy and did not have significant desquamation.    Impression/Plan: 1. 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 The patient has been doing well since completion of radiotherapy. I had to leave a voicemail for the patient letting her know would be happy to continue to follow her as needed, but she will also continue to follow up with Dr. Lindi Adie in medical oncology. She was counseled on skin care as well as measures to avoid sun exposure to this area.  2. Survivorship. I discussed the importance of survivorship evaluation and encouraged her to attend this evaluation.     Carola Rhine, PAC

## 2019-03-16 ENCOUNTER — Ambulatory Visit: Payer: Medicare Other

## 2019-03-22 ENCOUNTER — Ambulatory Visit: Payer: Medicare Other

## 2019-03-23 DIAGNOSIS — I10 Essential (primary) hypertension: Secondary | ICD-10-CM | POA: Insufficient documentation

## 2019-03-23 NOTE — Progress Notes (Signed)
Patient is here for follow up visit.  Subjective:   @Patient  ID: Kara Ramos, female    DOB: Oct 05, 1950, 69 y.o.   MRN: [id number]   Chief Complaint  Patient presents with  . Palpitations  . Follow-up    1 year    HPI   69 year old Caucasian female with hypertension, PVC's.  Patient's prior cardiac workup in 2018 was essentially unremarkable with normal LV function, mild MR, possible PFO. Exercise stress test with normal exercise capacity and no ischemic changes. Event monitor showed frequent PVCs, one 3 second pause at 1:49 AM, no other abnormalities.  She was started on beta blockers for frequent PVC's. PVC's/palpitations have resolved since then. She reports episodes of "fatigue" in the mornings, coinciding with BP as low as 80s/40s. She has mild, unchanged exertional dyspnea, but denies chest pain. She has regular f/u w/her PCP who is monitoring her lipids.   Current Outpatient Medications on File Prior to Visit  Medication Sig Dispense Refill  . albuterol (PROAIR HFA) 108 (90 Base) MCG/ACT inhaler Inhale 2 puffs into the lungs every 4 (four) hours as needed for wheezing or shortness of breath (coughing). 18 g 1  . Azelastine HCl 0.15 % SOLN 1-2 sprays per nostril 1-2 times a day as needed for runny nose. (Patient taking differently: Place 1-2 sprays into both nostrils 2 (two) times daily as needed (runny nose). ) 30 mL 5  . Cranberry 500 MG TABS Take 500 mg by mouth daily.     Marland Kitchen ibuprofen (ADVIL) 200 MG tablet Take 400 mg by mouth every 6 (six) hours as needed for moderate pain.     . metoprolol succinate (TOPROL-XL) 25 MG 24 hr tablet TAKE 1 TABLET BY MOUTH EVERY DAY 60 tablet 3  . Omeprazole (PRILOSEC PO) Take 20 mg by mouth daily.     . Polyethylene Glycol 400 (BLINK TEARS OP) Place 1 drop into both eyes 2 (two) times daily as needed (dry eyes).     . Probiotic Product (PROBIOTIC PO) Take 1 tablet by mouth daily.    . tamoxifen (NOLVADEX) 20 MG tablet Take 1  tablet (20 mg total) by mouth daily. 90 tablet 3   No current facility-administered medications on file prior to visit.    Cardiovascular studies:  EKG 03/24/2019: Sinus rhythm 66 bpm. Nonspecific T-abnormality.   Echocardiogram 12/12/2016: Left ventricle cavity is normal in size. Mild concentric hypertrophy of the left ventricle. Normal global wall motion. LVEF 55-60% Left atrial cavity is mildly dilated. 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 Mild (Grade I) mitral regurgitation. IVC is dilated with a respiratory response of <50%. This may suggest elevated right heart pressure.  Event monitor 11/09/2016-12/08/2016: Sinus rhythm with tachycardia up to 140 bpm. Sinus bradycardia with 3.0 sec pause at 1:49 AM. No symptoms associated with this. Occasional lightheadedness, flutter during sinus rhythm. No atrial fibrillation, atrial flutter.  Treadmill exercise stress test 11/13/2016: Resting EKG: Normal sinus rhythm. Stress EKG: Negative for ischemia Normal blood pressure response. Occasional PACs throughout the stress test. No atrial fibrillation or heart block. Normal pressure response. Exercised for 5:02 minutes, achieved 4.9 mets. Exercise capacity is low.   Review of Systems  Cardiovascular: Negative for chest pain, dyspnea on exertion, leg swelling, palpitations and syncope.       Objective:   Vitals:   03/24/19 1008  BP: (!) 134/57  Pulse: 71  Temp: 98.6 F (37 C)  SpO2: 97%     Physical Exam  Constitutional:  She appears well-developed and well-nourished.  Neck: No JVD present.  Cardiovascular: Normal rate, regular rhythm, normal heart sounds and intact distal pulses.  No murmur heard. Pulmonary/Chest: Effort normal and breath sounds normal. She has no wheezes. She has no rales.  Musculoskeletal:        General: No edema.  Nursing note and vitals reviewed.       Assessment & Recommendations:   69 year old Caucasian female  with hypertension, PVC's.  Recommend taking metoprolol in the afternoon to avoid dizziness episodes in the mornings. Recommend heart healthy diet and lifestyle.  Diet & Lifestyle recommendations:  Physical activity recommendation (The Physical Activity Guidelines for Americans. JAMA 2018;Nov 12) At least 150-300 minutes a week of moderate-intensity, or 75-150 minutes a week of vigorous-intensity aerobic physical activity, or an equivalent combination of moderate- and vigorous-intensity aerobic activity. Adults should perform muscle-strengthening activities on 2 or more days a week. Older adults should do multicomponent physical activity that includes balance training as well as aerobic and muscle-strengthening activities. Benefits of increased physical activity include lower risk of mortality including cardiovascular mortality, lower risk of cardiovascular events and associated risk factors (hypertension and diabetes), and lower risk of many cancers (including bladder, breast, colon, endometrium, esophagus, kidney, lung, and stomach). Additional improvments have been seen in cognition, risk of dementia, anxiety and depression, improved bone health, lower risk of falls, and associated injuries.  Dietary recommendation The 2019 ACC/AHA guidelines promote nutrition as a main fixture of cardiovascular wellness, with a recommendation for a varied diet of fruit, vegetables, fish, legumes, and whole grains (Class I), as well as recommendations to reduce sodium, cholesterol, processed meats, and refined sugars (Class IIa recommendation).10 Sodium intake, a topic of some controversy as of late, is recommended to be kept at 1,500 mg/day or less, far below the average daily intake in the [place] of 3,409 mg/day, and notably below that of previous [place] recommendations for 300mg /day.10,11 For those unable to reach 1,500 mg/day, they recommend at least a reduction of 1000 mg/day.  A [place]-Mediterranean Diet With  Intermittent Fasting: JACC Review Topic of the Week. J Am Coll Cardiol [id number] Pes[place]-Mediterranean diet, it is supplemented with extra-virgin olive oil (EVOO), which is the principle fat source, along with moderate amounts of dairy (particularly yogurt and cheese) and eggs, as well as modest amounts of alcohol consumption (ideally red wine with the evening meal), but few red and processed meats.  I will see her on as needed basis.   Nigel Mormon, MD [facility] [place] Pager: [phone] Office: [phone] If no answer Cell [phone]

## 2019-03-24 ENCOUNTER — Encounter: Payer: Self-pay | Admitting: Cardiology

## 2019-03-24 ENCOUNTER — Other Ambulatory Visit: Payer: Self-pay

## 2019-03-24 ENCOUNTER — Ambulatory Visit: Payer: Medicare Other | Admitting: Cardiology

## 2019-03-24 VITALS — BP 134/57 | HR 71 | Temp 98.6°F | Ht 62.0 in | Wt 175.3 lb

## 2019-03-24 DIAGNOSIS — I10 Essential (primary) hypertension: Secondary | ICD-10-CM | POA: Diagnosis not present

## 2019-03-24 DIAGNOSIS — I493 Ventricular premature depolarization: Secondary | ICD-10-CM | POA: Diagnosis not present

## 2019-03-27 DIAGNOSIS — R300 Dysuria: Secondary | ICD-10-CM | POA: Diagnosis not present

## 2019-03-27 DIAGNOSIS — N390 Urinary tract infection, site not specified: Secondary | ICD-10-CM | POA: Diagnosis not present

## 2019-04-01 NOTE — Progress Notes (Signed)
  Radiation Oncology         [phone]) [phone] ________________________________  Name: Kara Ramos MRN: [id number]  Date: 02/18/2019  DOB: 10-Mar-1950  End of Treatment Note  Diagnosis:   right-sided breast cancer     Indication for treatment:  Curative       Radiation treatment dates:   01/20/19 - 02/18/19  Site/dose:   The patient initially received a dose of 42.56 Gy in 16 fractions to the breast using whole-breast tangent fields. This was delivered using a 3-D conformal technique. The patient then received a boost to the seroma. This delivered an additional 8 Gy in 14fractions using a 3 field photon technique due to the depth of the seroma. The total dose was 50.56 Gy.  Narrative: The patient tolerated radiation treatment relatively well.   The patient had some expected skin irritation as she progressed during treatment. Moist desquamation was not present at the end of treatment.  Plan: The patient has completed radiation treatment. The patient will return to radiation oncology clinic for routine followup in one month. I advised the patient to call or return sooner if they have any questions or concerns related to their recovery or treatment. ________________________________  Jodelle Gross, M.D., Ph.D.

## 2019-04-06 ENCOUNTER — Ambulatory Visit: Payer: Medicare Other

## 2019-04-06 IMAGING — MG DIGITAL SCREENING BILATERAL MAMMOGRAM WITH TOMO AND CAD
8 series · 8 of 24 positions shown · non-contrast
Comparison: Previous exam(s).

CLINICAL DATA: Screening.

EXAM:
DIGITAL SCREENING BILATERAL MAMMOGRAM WITH TOMO AND CAD

[R MLO synth-2D]
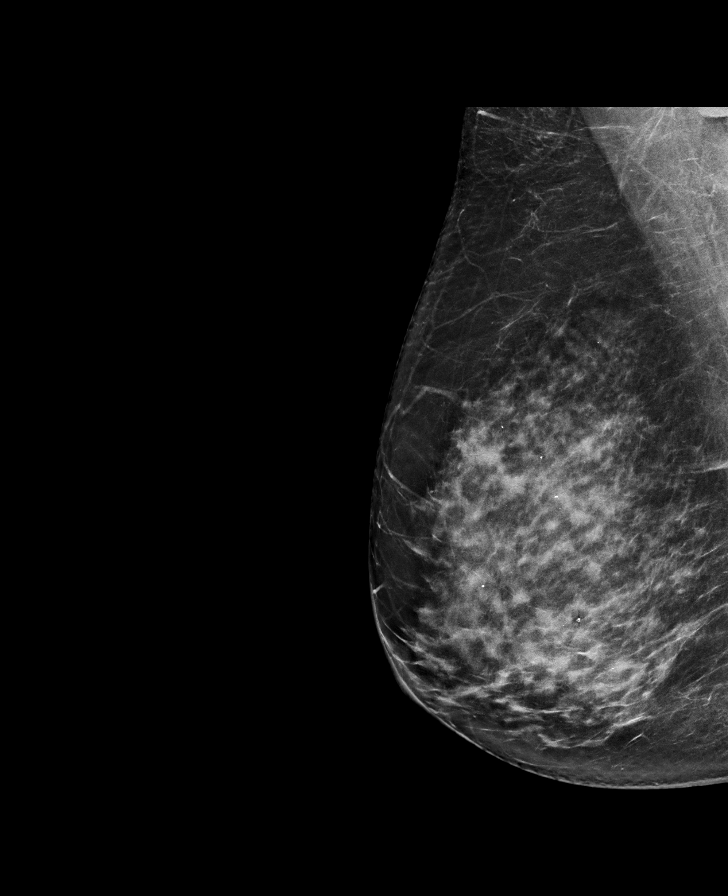

[R CC synth-2D]
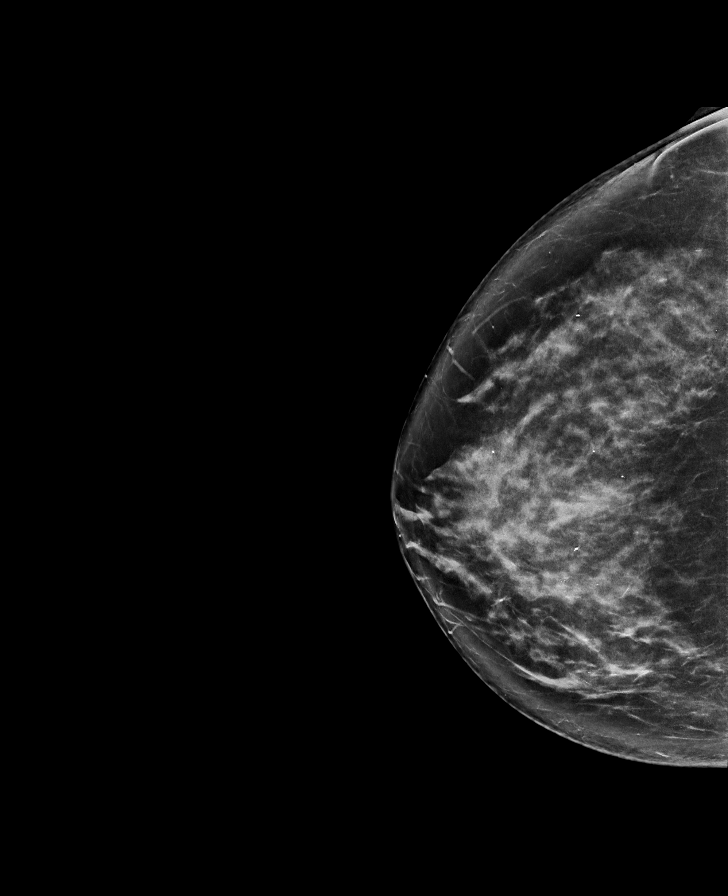

[L CC synth-2D]
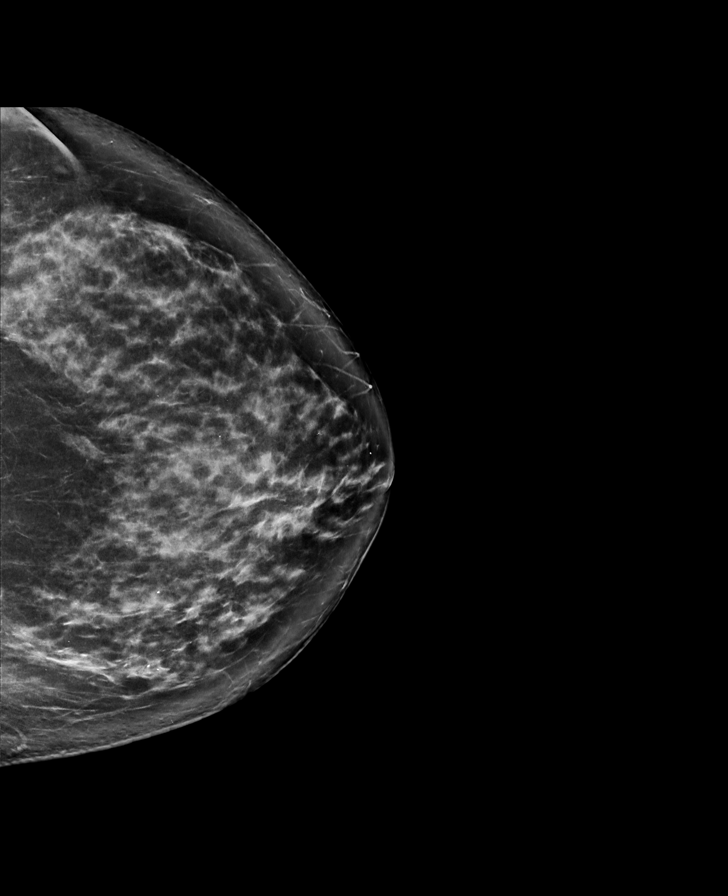

[L MLO synth-2D]
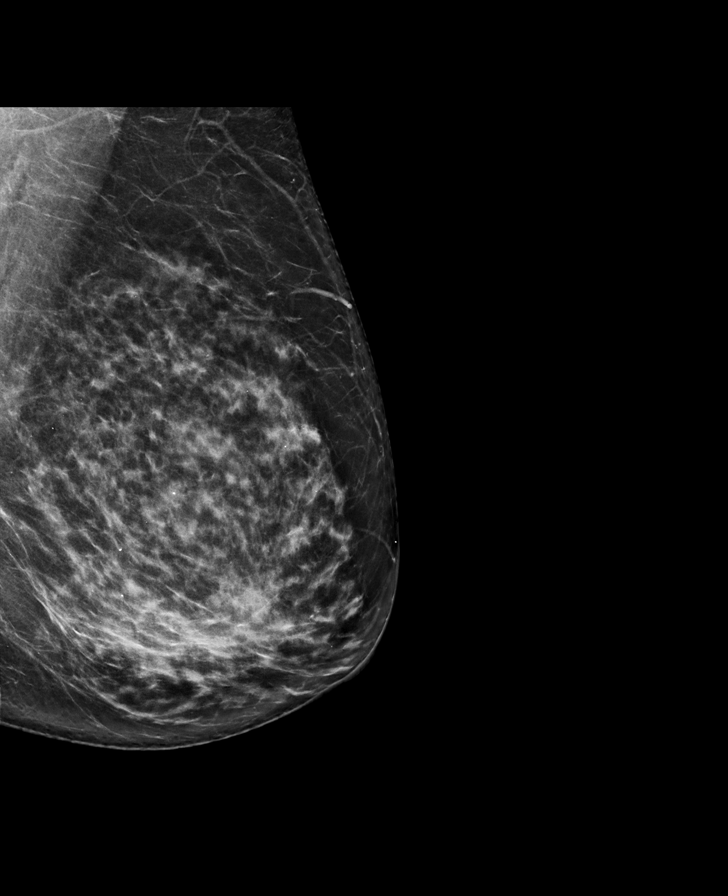

[L CC tomo · tomo slice 43/85.0]
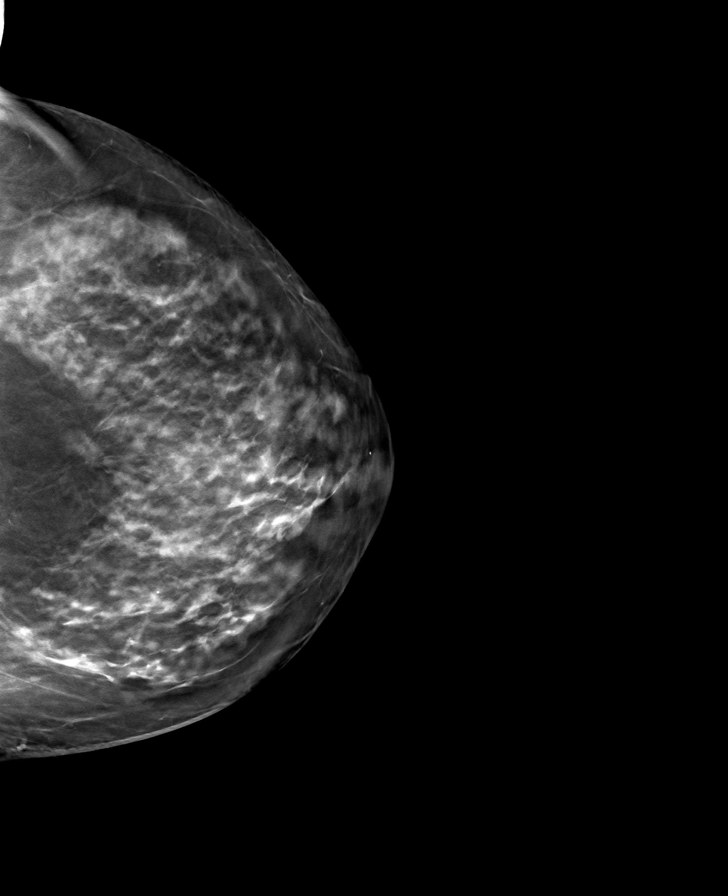

[R MLO tomo · tomo slice 41/81.0]
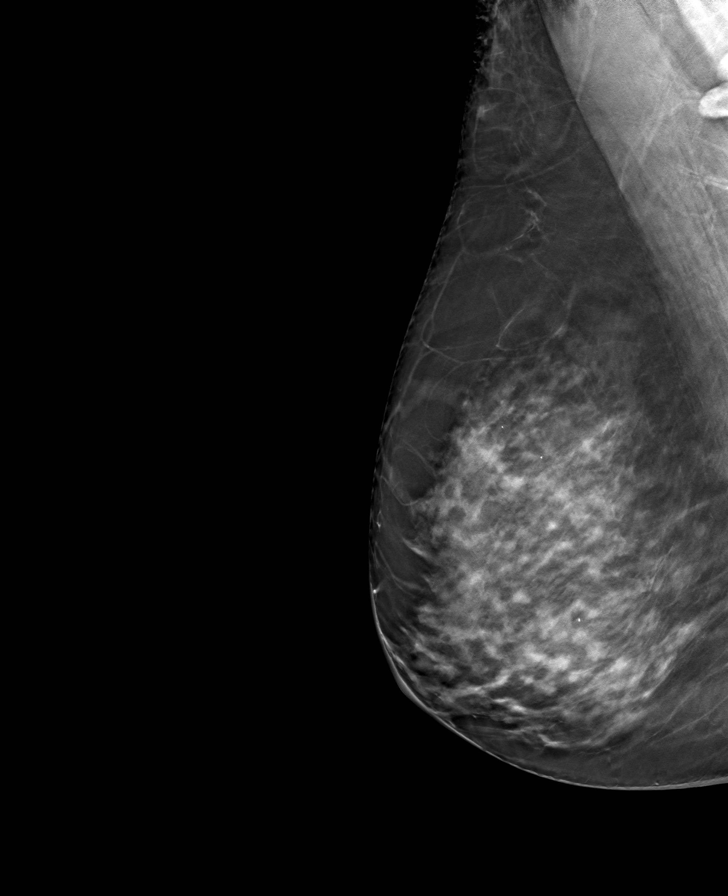

[L MLO tomo · tomo slice 41/82.0]
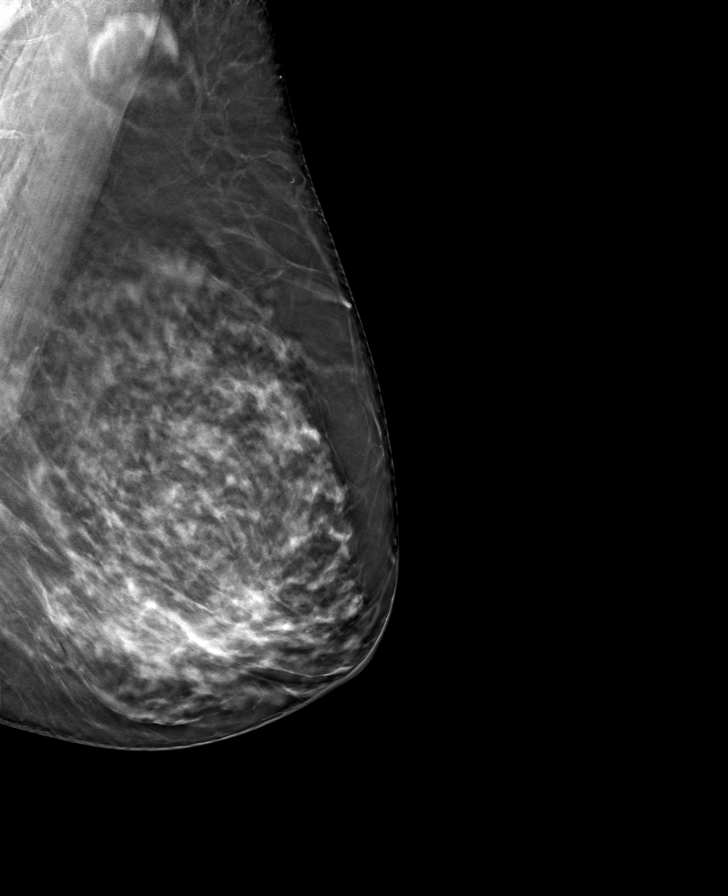

[R CC tomo · tomo slice 43/85.0]
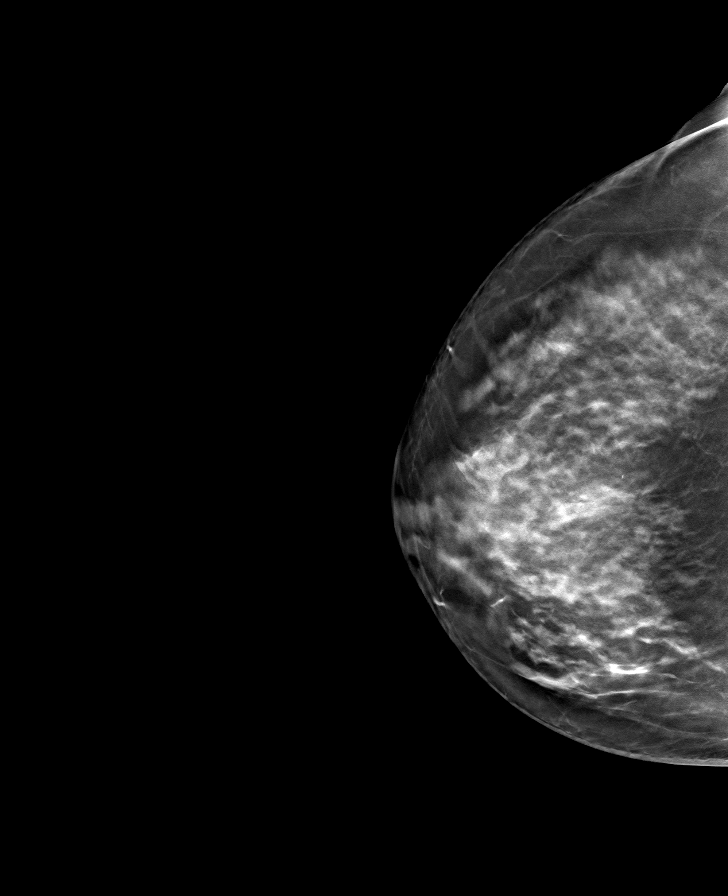

[8 of 24 positions shown; findings below may reference images not displayed]

ACR Breast Density Category c: The breast tissue is heterogeneously
dense, which may obscure small masses.
FINDINGS: There are no findings suspicious for malignancy. Images were
processed with CAD.
IMPRESSION: No mammographic evidence of malignancy. A result letter of this
screening mammogram will be mailed directly to the patient.

RECOMMENDATION:
Screening mammogram in one year. (Code:[id number])

BI-RADS CATEGORY  1: Negative.

## 2019-04-27 DIAGNOSIS — N12 Tubulo-interstitial nephritis, not specified as acute or chronic: Secondary | ICD-10-CM | POA: Diagnosis not present

## 2019-04-27 DIAGNOSIS — R300 Dysuria: Secondary | ICD-10-CM | POA: Diagnosis not present

## 2019-05-26 ENCOUNTER — Other Ambulatory Visit: Payer: Self-pay

## 2019-05-26 ENCOUNTER — Inpatient Hospital Stay: Payer: Medicare Other | Attending: Hematology and Oncology | Admitting: Adult Health

## 2019-05-26 ENCOUNTER — Encounter: Payer: Self-pay | Admitting: Adult Health

## 2019-05-26 VITALS — BP 143/65 | HR 63 | Temp 99.4°F | Resp 17 | Ht 62.0 in | Wt 179.5 lb

## 2019-05-26 DIAGNOSIS — Z170 Estrogen receptor positive status [ER+]: Secondary | ICD-10-CM | POA: Diagnosis not present

## 2019-05-26 DIAGNOSIS — D0511 Intraductal carcinoma in situ of right breast: Secondary | ICD-10-CM

## 2019-05-26 DIAGNOSIS — Z79810 Long term (current) use of selective estrogen receptor modulators (SERMs): Secondary | ICD-10-CM | POA: Insufficient documentation

## 2019-05-26 DIAGNOSIS — N951 Menopausal and female climacteric states: Secondary | ICD-10-CM | POA: Insufficient documentation

## 2019-05-26 NOTE — Progress Notes (Signed)
SURVIVORSHIP VIRTUAL VISIT:    BRIEF ONCOLOGIC HISTORY:  Oncology History  Ductal carcinoma in situ (DCIS) of right breast  11/01/2018 Initial Diagnosis   Routine screening mammogram detected calcifications in the LIQ spanning 1.6cm. 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    12/24/2018 Surgery   Right lumpectomy Barry Dienes) [phone]): DCIS, intermediate grade, 1.0cm, clear margins. No regional lymph nodes were examined.   12/24/2018 Cancer Staging   Staging form: Breast, AJCC 8th Edition - Pathologic stage from 12/24/2018: Stage 0 (pTis (DCIS), pN0, cM0, ER+, PR+)    01/20/2019 - 02/18/2019 Radiation Therapy   The patient initially received a dose of 42.56 Gy in 16 fractions to the breast using whole-breast tangent fields. This was delivered using a 3-D conformal technique. The patient then received a boost to the seroma. This delivered an additional 8 Gy in 36factions using a 3 field photon technique due to the depth of the seroma. The total dose was 50.56 Gy.   02/2019 - 02/2024 Anti-estrogen oral therapy   Tamoxifen     INTERVAL HISTORY:  Kara Ramos review her survivorship care plan detailing her treatment course for breast cancer, as well as monitoring long-term side effects of that treatment, education regarding health maintenance, screening, and overall wellness and health promotion.     Overall, Kara Ramos feeling quite well.  She completed the survivorship survey.  Her biggest issue is hot flashes which are impacting her sleep.  Her survivorship survey will be scanned into media.  REVIEW OF SYSTEMS:  Review of Systems  Constitutional: Negative for appetite change, chills, diaphoresis, fatigue and unexpected weight change.  HENT:   Negative for hearing loss, lump/mass and trouble swallowing.   Eyes: Negative for eye problems and icterus.  Respiratory: Negative for chest tightness, cough and shortness of breath.    Cardiovascular: Negative for chest pain, leg swelling and palpitations.  Gastrointestinal: Negative for abdominal distention, abdominal pain, constipation, diarrhea, nausea and vomiting.  Endocrine: Positive for hot flashes.  Musculoskeletal: Negative for arthralgias.  Skin: Negative for itching and rash.  Neurological: Negative for dizziness, extremity weakness, headaches and numbness.  Psychiatric/Behavioral: Negative for depression. The patient is not nervous/anxious.   Breast: Denies any new nodularity, masses, tenderness, nipple changes, or nipple discharge.      ONCOLOGY TREATMENT TEAM:  1. Surgeon[facility]  [facility] 2. Medical Oncologist: Dr. GLindi Adie 3. Radiation Oncologist: Dr. MLisbeth Renshaw   PAST MEDICAL/SURGICAL HISTORY:  Past Medical History:  Diagnosis Date  . Allergy   . Arthritis   . Asthma   . [place]   . [facility]   cervical- had hysterectomy  . Complication of anesthesia    per patient 'one time was aware and awake during surgery and also BP can get really low"  . GERD (gastroesophageal reflux disease)   . History of IBS   . Irregular heart rhythm    occasional PAC'S, PVC's   Past Surgical History:  Procedure Laterality Date  . ABDOMINAL HYSTERECTOMY     both ovaries removed  . APPENDECTOMY    . BREAST LUMPECTOMY WITH RADIOACTIVE SEED LOCALIZATION Right 12/24/2018   Procedure: RIGHT BREAST LUMPECTOMY WITH RADIOACTIVE SEED LOCALIZATION;  Surgeon: BStark Klein MD;  [place]  Service: General;  Laterality: Right;  . BUNIONECTOMY     both feet  . CATARACT EXTRACTION     right  . COLON SURGERY  1988   part of colon  removed d/t endometriosis  . COLONOSCOPY  05/2017   One benign polyp  . CORRECTION HAMMER TOE  03/2017     ALLERGIES:  Allergies  Allergen Reactions  . Azithromycin Rash and Other (See Comments)    Pt stated, "Lips turn numb and itching"   . Erythromycin Nausea And Vomiting and Rash  . Tape      Caused redness and peeling  . Tessalon [Benzonatate] Itching and Rash     CURRENT MEDICATIONS:  Outpatient Encounter Medications as of 05/26/2019  Medication Sig  . albuterol (PROAIR HFA) 108 (90 Base) MCG/ACT inhaler Inhale 2 puffs into the lungs every 4 (four) hours as needed for wheezing or shortness of breath (coughing).  . Azelastine HCl 0.15 % SOLN 1-2 sprays per nostril 1-2 times a day as needed for runny nose. (Patient taking differently: Place 1-2 sprays into both nostrils 2 (two) times daily as needed (runny nose). )  . Cranberry 500 MG TABS Take 500 mg by mouth daily.   Marland Kitchen ibuprofen (ADVIL) 200 MG tablet Take 400 mg by mouth every 6 (six) hours as needed for moderate pain.   . metoprolol succinate (TOPROL-XL) 25 MG 24 hr tablet TAKE 1 TABLET BY MOUTH EVERY DAY  . Omeprazole (PRILOSEC PO) Take 20 mg by mouth daily.   . Polyethylene Glycol 400 (BLINK TEARS OP) Place 1 drop into both eyes 2 (two) times daily as needed (dry eyes).   . Probiotic Product (PROBIOTIC PO) Take 1 tablet by mouth daily.  . tamoxifen (NOLVADEX) 20 MG tablet Take 1 tablet (20 mg total) by mouth daily.   No facility-administered encounter medications on file as of 05/26/2019.     ONCOLOGIC FAMILY HISTORY:  Family History  Problem Relation Age of Onset  . Colon cancer Maternal Grandmother   . Heart disease Mother   . Brain cancer Father   . Heart disease Father   . Lung cancer Brother   . Heart disease Sister   . Cancer Paternal Grandmother   . Esophageal cancer Neg Hx   . Stomach cancer Neg Hx   . Rectal cancer Neg Hx      GENETIC COUNSELING/TESTING: Not at this time  SOCIAL HISTORY:  Social History   Socioeconomic History  . Marital status: Widowed    Spouse name: Not on file  . Number of children: 0  . Years of education: Not on file  . Highest education level: Not on file  Occupational History  . Not on file  Tobacco Use  . Smoking status: Former Smoker    Packs/day: 1.00     Years: 4.00    Pack years: 4.00    Types: Cigarettes    Quit date: 1977    Years since quitting: 44.3  . Smokeless tobacco: Never Used  Substance and Sexual Activity  . Alcohol use: Yes    Alcohol/week: 1.0 standard drinks    Types: 1 Glasses of wine per week    Comment: occasional  . Drug use: No  . Sexual activity: Not on file  Other Topics Concern  . Not on file  Social History Narrative  . Not on file   Social Determinants of Health   Financial Resource Strain:   . Difficulty of Paying Living Expenses:   Food Insecurity:   . Worried About Charity fundraiser in the Last Year:   . Arboriculturist in the Last Year:   Transportation Needs:   . Film/video editor (Medical):   Marland Kitchen  Lack of Transportation (Non-Medical):   Physical Activity:   . Days of Exercise per Week:   . Minutes of Exercise per Session:   Stress:   . Feeling of Stress :   Social Connections:   . Frequency of Communication with Friends and Family:   . Frequency of Social Gatherings with Friends and Family:   . Attends Religious Services:   . Active Member of Clubs or Organizations:   . Attends Archivist Meetings:   Marland Kitchen Marital Status:   Intimate Partner Violence:   . Fear of Current or Ex-Partner:   . Emotionally Abused:   Marland Kitchen Physically Abused:   . Sexually Abused:      OBSERVATIONS/OBJECTIVE:  BP (!) 143/65 (BP Location: Left Arm, Patient Position: Sitting)   Pulse 63   Temp 99.4 F (37.4 C) (Temporal)   Resp 17   Ht 5' 2" (1.575 m)   Wt 179 lb 8 oz (81.4 kg)   SpO2 100%   BMI 32.83 kg/m  GENERAL: Patient is a well appearing female in no acute distress HEENT:  Sclerae anicteric.  Oropharynx clear and moist. No ulcerations or evidence of oropharyngeal candidiasis. Neck is supple.  NODES:  No cervical, supraclavicular, or axillary lymphadenopathy palpated.  BREAST EXAM:  Right breast s/p lumpectomy and radiation, no sign of local recurrence, left breast benign LUNGS:  Clear to  auscultation bilaterally.  No wheezes or rhonchi. HEART:  Regular rate and rhythm. No murmur appreciated. ABDOMEN:  Soft, nontender.  Positive, normoactive bowel sounds. No organomegaly palpated. MSK:  No focal spinal tenderness to palpation. Full range of motion bilaterally in the upper extremities. EXTREMITIES:  No peripheral edema.   SKIN:  Clear with no obvious rashes or skin changes. No nail dyscrasia. NEURO:  Nonfocal. Well oriented.  Appropriate affect.    LABORATORY DATA:  None for this visit.  DIAGNOSTIC IMAGING:  None for this visit.      ASSESSMENT AND PLAN:  Ms.. Ramos is a pleasant 69 y.o. 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  She presents to the Survivorship Clinic for our initial meeting and routine follow-up post-completion of treatment for breast cancer.    1. Stage 0 right breast cancer:  Kara Ramos is continuing to recover from definitive treatment for breast cancer. She will follow-up with her medical oncologist, Dr. Lindi Adie in 6 months with history and physical exam per surveillance protocol.  She will continue her anti-estrogen therapy with Tamoxifen. Thus far, she is tolerating the moderately well.  I suggested she try taking it at a different time of day to see if that will help with the hot flashes.  If not, she will call us, as we can decrease her dose to 25m.   Her mammogram is due 10/2019; orders placed today. Today, a comprehensive survivorship care plan and treatment summary was reviewed with the patient today detailing her breast cancer diagnosis, treatment course, potential late/long-term effects of treatment, appropriate follow-up care with recommendations for the future, and patient education resources.  A copy of this summary, along with a letter will be sent to the patient's primary  care provider via mail/fax/In Basket message after today's visit.    2. Bone health:   She was given education on specific activities to promote bone health.  3. Cancer screening:  Due to Kara Ramos's history and her age, she should receive screening  for skin cancers, colon cancer, and gynecologic cancers.  The information and recommendations are listed on the patient's comprehensive care plan/treatment summary and were reviewed in detail with the patient.    4. Health maintenance and wellness promotion: Kara Ramos was encouraged to consume 5-7 servings of fruits and vegetables per day. We reviewed the "Nutrition Rainbow" handout, as well as the handout "Take Control of Your Health and Reduce Your Cancer Risk" from the [facility].  She was also encouraged to engage in moderate to vigorous exercise for 30 minutes per day most days of the week. We discussed the [employer] fitness program, which is designed for cancer survivors to help them become more physically fit after cancer treatments.  She was instructed to limit her alcohol consumption and continue to abstain from tobacco use.     5. Support services/counseling: It is not uncommon for this period of the patient's cancer care trajectory to be one of many emotions and stressors.  We discussed how this can be increasingly difficult during the times of quarantine and social distancing due to the COVID-19 pandemic.   She was given information regarding our available services and encouraged to contact me with any questions or for help enrolling in any of our support group/programs.    Follow up instructions:    -Return to cancer center in 6 months for f/u with Dr. Lindi Adie  -Mammogram due in 10/2019  -Follow up with surgery 05/2020 -She is welcome to return back to the Survivorship Clinic at any time; no additional follow-up needed at this time.  -Consider referral back to survivorship as a long-term survivor for continued  surveillance  The patient was provided an opportunity to ask questions and all were answered. The patient agreed with the plan and demonstrated an understanding of the instructions.   Total encounter time: 45 minutes*  Wilber Bihari, NP 05/27/19 6:39 AM Medical Oncology and Hematology [facility] [place], [place] [address] Tel. [phone]    Fax. [phone]   *Total Encounter Time as defined by the Centers for Medicare and Medicaid Services includes, in addition to the face-to-face time of a patient visit (documented in the note above) non-face-to-face time: obtaining and reviewing outside history, ordering and reviewing medications, tests or procedures, care coordination (communications with other health care professionals or caregivers) and documentation in the medical record.

## 2019-05-27 ENCOUNTER — Telehealth: Payer: Self-pay | Admitting: Adult Health

## 2019-05-27 DIAGNOSIS — L820 Inflamed seborrheic keratosis: Secondary | ICD-10-CM | POA: Diagnosis not present

## 2019-05-27 DIAGNOSIS — D485 Neoplasm of uncertain behavior of skin: Secondary | ICD-10-CM | POA: Diagnosis not present

## 2019-05-27 NOTE — Telephone Encounter (Signed)
Scheduled appt per 4/12 los. Pt confirmed new appt date and time.

## 2019-07-03 DIAGNOSIS — Z0120 Encounter for dental examination and cleaning without abnormal findings: Secondary | ICD-10-CM | POA: Diagnosis not present

## 2019-07-08 NOTE — Progress Notes (Signed)
Follow Up Note  RE: Kara Ramos MRN: [id number] DOB: [birth year] Date of Office Visit: 07/09/2019  Referring provider: Vernie Shanks, MD Primary care provider: Vernie Shanks, MD  Chief Complaint: Follow-up, Allergic Rhinitis  (patient added nasacort ), and Asthma  History of Present Illness: I had the pleasure of seeing Kara Ramos for a follow up visit at the [facility] on 07/09/2019. She is a 69 y.o. female, who is being followed for adverse food reaction, asthma and allergic rhinitis. Her previous allergy office visit was on 01/08/2019 with Dr. Maudie Mercury. Today is a regular follow up visit. Up to date with COVID-19 vaccine: yes - pfizer.   Adverse food reaction Trying to limit wheat and onions. Patient did a low fodmap diet and she did feel better during that time.  No recent follow up with GI. No other reactions.   Mild intermittent asthma without complication Denies any ER/urgent care visits or prednisone use since the last visit. Sometimes uses albuterol 2-3 times per month mainly due to exertion related activities such as mowing the lawn.   Other allergic rhinitis Doing well and wearing masks actually helped. Noted some nasal congestion a few months ago and using Nasacort 1 spray per nostril 1-2 times a day with good benefit. Few nosebleeds. Using azelastine nasal spray as needed with good benefit.  Takes antihistamine very rarely.  Used to be on allergy injections as a teenager.   Assessment and Plan: Kara Ramos is a 69 y.o. female with: Mild intermittent asthma without complication Past history - Used to have asthma as a teenager now only flares with URIs. 2020 spirometry was normal. Interim history - used albuterol 2-3 times per month due to exertion related activities with good benefit.   ACT score 23.  Today's spirometry was normal.   May use albuterol rescue inhaler 2 puffs every 4 to 6 hours as needed for shortness of breath, chest  tightness, coughing, and wheezing. May use albuterol rescue inhaler 2 puffs 5 to 15 minutes prior to strenuous physical activities. Monitor frequency of use.   Other allergic rhinitis Past history - Perennial rhinitis symptoms since teenage years.  Currently using Flonase and Xyzal with some benefit.  Last skin testing was over 50 years ago which showed multiple positives per patient report. Used to be on allergy immunotherapy as a teenager.  Interim history - some nasal congestion at times.   May use azelastine nasal spray 1-2 sprays per nostril 1-2 times a day as needed for drainage.  May use over the counter antihistamines such as Zyrtec (cetirizine), Claritin (loratadine), Allegra (fexofenadine), or Xyzal (levocetirizine) daily as needed.  May use Nasacort 1 spray 1-2 times a day as needed for nasal congestion.   If symptoms worsen then consider environmental allergy testing in future.   Adverse food reaction Past history - Diagnosed with IBS and noticing worsening abdominal pains and diarrhea the last few years. Concerned for food allergies. Skin testing as a teenager was positive to milk, corn, wheat, grains, coffee, grapefruit, shrimp per patient report. H/o distal sigmoid resection. Follows with GI. 2020 skin testing showed: Borderline positive to wheat, shellfish mix, trout, oat, rye, onion and pineapple.  Interim history - limiting wheat and onion intake. Symptoms improved with low fodmap diet. No other symptoms.   Continue to avoid foods that bother you.   For mild symptoms you can take over the counter antihistamines such as Benadryl and monitor symptoms closely. If symptoms worsen or  if you have severe symptoms including breathing issues, throat closure, significant swelling, whole body hives, severe diarrhea and vomiting, lightheadedness then seek immediate medical care.  Return in about 1 year (around 07/08/2020).  Diagnostics: Spirometry:  Tracings reviewed. Her effort: Good  reproducible efforts. FVC: 2.72L FEV1: 1.96L, 90% predicted FEV1/FVC ratio: 72% Interpretation: Spirometry consistent with normal pattern.  Please see scanned spirometry results for details.  Medication List:  Current Outpatient Medications  Medication Sig Dispense Refill  . albuterol (PROAIR HFA) 108 (90 Base) MCG/ACT inhaler Inhale 2 puffs into the lungs every 4 (four) hours as needed for wheezing or shortness of breath (coughing). 18 g 1  . Azelastine HCl 0.15 % SOLN 1-2 sprays per nostril 1-2 times a day as needed for runny nose. (Patient taking differently: Place 1-2 sprays into both nostrils 2 (two) times daily as needed (runny nose). ) 30 mL 5  . Cranberry 500 MG TABS Take 500 mg by mouth daily.     Marland Kitchen ibuprofen (ADVIL) 200 MG tablet Take 400 mg by mouth every 6 (six) hours as needed for moderate pain.     . metoprolol succinate (TOPROL-XL) 25 MG 24 hr tablet TAKE 1 TABLET BY MOUTH EVERY DAY 60 tablet 3  . Omeprazole (PRILOSEC PO) Take 20 mg by mouth daily.     . Polyethylene Glycol 400 (BLINK TEARS OP) Place 1 drop into both eyes 2 (two) times daily as needed (dry eyes).     . Probiotic Product (PROBIOTIC PO) Take 1 tablet by mouth daily.    . tamoxifen (NOLVADEX) 20 MG tablet Take 1 tablet (20 mg total) by mouth daily. 90 tablet 3  . triamcinolone (NASACORT ALLERGY 24HR) 55 MCG/ACT AERO nasal inhaler Place 1 spray into the nose daily.     No current facility-administered medications for this visit.   Allergies: Allergies  Allergen Reactions  . Azithromycin Rash and Other (See Comments)    Pt stated, "Lips turn numb and itching"   . Erythromycin Nausea And Vomiting and Rash  . Tape     Caused redness and peeling  . Tessalon [Benzonatate] Itching and Rash   I reviewed her past medical history, social history, family history, and environmental history and no significant changes have been reported from her previous visit.  Review of Systems  Constitutional: Negative for  appetite change, chills, fever and unexpected weight change.  HENT: Negative for congestion and rhinorrhea.   Eyes: Negative for itching.  Respiratory: Negative for cough, chest tightness, shortness of breath and wheezing.   Cardiovascular: Negative for chest pain.  Gastrointestinal: Negative for abdominal pain.  Genitourinary: Negative for difficulty urinating.  Skin: Negative for rash.  Allergic/Immunologic: Positive for environmental allergies.  Neurological: Negative for headaches.   Objective: BP 102/80 (BP Location: Left Arm, Patient Position: Sitting, Cuff Size: Normal)   Pulse 64   Temp 98.2 F (36.8 C) (Temporal)   Resp 16   Wt 179 lb (81.2 kg)   SpO2 98%   BMI 32.74 kg/m  Body mass index is 32.74 kg/m. Physical Exam  Constitutional: She is oriented to person, place, and time. She appears well-developed and well-nourished.  HENT:  Head: Normocephalic and atraumatic.  Right Ear: External ear normal.  Left Ear: External ear normal.  Nose: Nose normal.  Mouth/Throat: Oropharynx is clear and moist.  Eyes: Conjunctivae and EOM are normal.  Cardiovascular: Normal rate, regular rhythm and normal heart sounds. Exam reveals no gallop and no friction rub.  No murmur heard. Pulmonary/Chest: Effort  normal and breath sounds normal. She has no wheezes. She has no rales.  Abdominal: Soft.  Musculoskeletal:     Cervical back: Neck supple.  Neurological: She is alert and oriented to person, place, and time.  Skin: Skin is warm. No rash noted.  Psychiatric: She has a normal mood and affect. Her behavior is normal.  Nursing note and vitals reviewed.  Previous notes and tests were reviewed. The plan was reviewed with the patient/family, and all questions/concerned were addressed.  It was my pleasure to see Kara Ramos today and participate in her care. Please feel free to contact me with any questions or concerns.  Sincerely,  Rexene Alberts, DO Allergy & Immunology  [facility] office: [phone] [facility] office: [place] office: [phone]

## 2019-07-09 ENCOUNTER — Other Ambulatory Visit: Payer: Self-pay

## 2019-07-09 ENCOUNTER — Ambulatory Visit: Payer: Medicare Other | Admitting: Allergy

## 2019-07-09 ENCOUNTER — Encounter: Payer: Self-pay | Admitting: Allergy

## 2019-07-09 VITALS — BP 102/80 | HR 64 | Temp 98.2°F | Resp 16 | Wt 179.0 lb

## 2019-07-09 DIAGNOSIS — J3089 Other allergic rhinitis: Secondary | ICD-10-CM | POA: Diagnosis not present

## 2019-07-09 DIAGNOSIS — T781XXD Other adverse food reactions, not elsewhere classified, subsequent encounter: Secondary | ICD-10-CM | POA: Diagnosis not present

## 2019-07-09 DIAGNOSIS — J4520 Mild intermittent asthma, uncomplicated: Secondary | ICD-10-CM

## 2019-07-09 NOTE — Patient Instructions (Addendum)
Mild intermittent asthma  May use albuterol rescue inhaler 2 puffs every 4 to 6 hours as needed for shortness of breath, chest tightness, coughing, and wheezing. May use albuterol rescue inhaler 2 puffs 5 to 15 minutes prior to strenuous physical activities. Monitor frequency of use.   Other allergic rhinitis  May use azelastine nasal spray 1-2 sprays per nostril 1-2 times a day as needed for drainage.  May use over the counter antihistamines such as Zyrtec (cetirizine), Claritin (loratadine), Allegra (fexofenadine), or Xyzal (levocetirizine) daily as needed.  May use Nasacort 1 spray 1-2 times a day as needed for nasal congestion.   If symptoms worsen then consider environmental allergy testing in future.   Food   Past skin testing showed: Borderline positive to wheat, shellfish mix, trout, oat, rye, onion and pineapple.   Continue to avoid foods that bother you.   For mild symptoms you can take over the counter antihistamines such as Benadryl and monitor symptoms closely. If symptoms worsen or if you have severe symptoms including breathing issues, throat closure, significant swelling, whole body hives, severe diarrhea and vomiting, lightheadedness then seek immediate medical care.  Follow up in 12 months or sooner if needed.   Reducing Pollen Exposure . Pollen seasons: trees (spring), grass (summer) and ragweed/weeds (fall). Marland Kitchen Keep windows closed in your home and car to lower pollen exposure.  Susa Simmonds air conditioning in the bedroom and throughout the house if possible.  . Avoid going out in dry windy days - especially early morning. . Pollen counts are highest between 5 - 10 AM and on dry, hot and windy days.  . Save outside activities for late afternoon or after a heavy rain, when pollen levels are lower.  . Avoid mowing of grass if you have grass pollen allergy. Marland Kitchen Be aware that pollen can also be transported indoors on people and pets.  . Dry your clothes in an automatic  dryer rather than hanging them outside where they might collect pollen.  . Rinse hair and eyes before bedtime.

## 2019-07-09 NOTE — Assessment & Plan Note (Signed)
Past history - Perennial rhinitis symptoms since teenage years.  Currently using Flonase and Xyzal with some benefit.  Last skin testing was over 50 years ago which showed multiple positives per patient report. Used to be on allergy immunotherapy as a teenager.  Interim history - some nasal congestion at times.   May use azelastine nasal spray 1-2 sprays per nostril 1-2 times a day as needed for drainage.  May use over the counter antihistamines such as Zyrtec (cetirizine), Claritin (loratadine), Allegra (fexofenadine), or Xyzal (levocetirizine) daily as needed.  May use Nasacort 1 spray 1-2 times a day as needed for nasal congestion.   If symptoms worsen then consider environmental allergy testing in future.

## 2019-07-09 NOTE — Assessment & Plan Note (Signed)
Past history - Diagnosed with IBS and noticing worsening abdominal pains and diarrhea the last few years. Concerned for food allergies. Skin testing as a teenager was positive to milk, corn, wheat, grains, coffee, grapefruit, shrimp per patient report. H/o distal sigmoid resection. Follows with GI. 2020 skin testing showed: Borderline positive to wheat, shellfish mix, trout, oat, rye, onion and pineapple.  Interim history - limiting wheat and onion intake. Symptoms improved with low fodmap diet. No other symptoms.   Continue to avoid foods that bother you.   For mild symptoms you can take over the counter antihistamines such as Benadryl and monitor symptoms closely. If symptoms worsen or if you have severe symptoms including breathing issues, throat closure, significant swelling, whole body hives, severe diarrhea and vomiting, lightheadedness then seek immediate medical care.

## 2019-07-09 NOTE — Assessment & Plan Note (Signed)
Past history - Used to have asthma as a teenager now only flares with URIs. 2020 spirometry was normal. Interim history - used albuterol 2-3 times per month due to exertion related activities with good benefit.   ACT score 23.  Today's spirometry was normal.   May use albuterol rescue inhaler 2 puffs every 4 to 6 hours as needed for shortness of breath, chest tightness, coughing, and wheezing. May use albuterol rescue inhaler 2 puffs 5 to 15 minutes prior to strenuous physical activities. Monitor frequency of use.

## 2019-07-15 DIAGNOSIS — R232 Flushing: Secondary | ICD-10-CM | POA: Diagnosis not present

## 2019-07-15 DIAGNOSIS — C50311 Malignant neoplasm of lower-inner quadrant of right female breast: Secondary | ICD-10-CM | POA: Diagnosis not present

## 2019-07-15 DIAGNOSIS — T451X5A Adverse effect of antineoplastic and immunosuppressive drugs, initial encounter: Secondary | ICD-10-CM | POA: Diagnosis not present

## 2019-07-15 DIAGNOSIS — N390 Urinary tract infection, site not specified: Secondary | ICD-10-CM | POA: Diagnosis not present

## 2019-07-15 DIAGNOSIS — Z170 Estrogen receptor positive status [ER+]: Secondary | ICD-10-CM | POA: Diagnosis not present

## 2019-08-05 DIAGNOSIS — N390 Urinary tract infection, site not specified: Secondary | ICD-10-CM | POA: Diagnosis not present

## 2019-09-02 DIAGNOSIS — H43393 Other vitreous opacities, bilateral: Secondary | ICD-10-CM | POA: Diagnosis not present

## 2019-09-02 DIAGNOSIS — H2513 Age-related nuclear cataract, bilateral: Secondary | ICD-10-CM | POA: Diagnosis not present

## 2019-09-03 DIAGNOSIS — Z20822 Contact with and (suspected) exposure to covid-19: Secondary | ICD-10-CM | POA: Diagnosis not present

## 2019-09-03 DIAGNOSIS — Z03818 Encounter for observation for suspected exposure to other biological agents ruled out: Secondary | ICD-10-CM | POA: Diagnosis not present

## 2019-09-22 DIAGNOSIS — H25042 Posterior subcapsular polar age-related cataract, left eye: Secondary | ICD-10-CM | POA: Diagnosis not present

## 2019-09-22 DIAGNOSIS — H2512 Age-related nuclear cataract, left eye: Secondary | ICD-10-CM | POA: Diagnosis not present

## 2019-09-22 DIAGNOSIS — H25012 Cortical age-related cataract, left eye: Secondary | ICD-10-CM | POA: Diagnosis not present

## 2019-10-07 DIAGNOSIS — Z0000 Encounter for general adult medical examination without abnormal findings: Secondary | ICD-10-CM | POA: Diagnosis not present

## 2019-10-07 DIAGNOSIS — F5101 Primary insomnia: Secondary | ICD-10-CM | POA: Diagnosis not present

## 2019-10-07 DIAGNOSIS — I493 Ventricular premature depolarization: Secondary | ICD-10-CM | POA: Diagnosis not present

## 2019-10-07 DIAGNOSIS — Z23 Encounter for immunization: Secondary | ICD-10-CM | POA: Diagnosis not present

## 2019-10-22 ENCOUNTER — Other Ambulatory Visit: Payer: Self-pay

## 2019-10-22 ENCOUNTER — Ambulatory Visit
Admission: RE | Admit: 2019-10-22 | Discharge: 2019-10-22 | Disposition: A | Discharge status: Home / Self Care | Payer: Medicare Other | Source: Ambulatory Visit | Attending: Adult Health | Admitting: Adult Health

## 2019-10-22 DIAGNOSIS — R922 Inconclusive mammogram: Secondary | ICD-10-CM | POA: Diagnosis not present

## 2019-10-22 DIAGNOSIS — Z853 Personal history of malignant neoplasm of breast: Secondary | ICD-10-CM | POA: Diagnosis not present

## 2019-10-22 DIAGNOSIS — D0511 Intraductal carcinoma in situ of right breast: Secondary | ICD-10-CM

## 2019-10-22 HISTORY — DX: Malignant neoplasm of unspecified site of unspecified female breast: C50.919

## 2019-10-22 HISTORY — DX: Personal history of irradiation: Z92.3

## 2019-10-27 DIAGNOSIS — Z01419 Encounter for gynecological examination (general) (routine) without abnormal findings: Secondary | ICD-10-CM | POA: Diagnosis not present

## 2019-10-27 DIAGNOSIS — R319 Hematuria, unspecified: Secondary | ICD-10-CM | POA: Diagnosis not present

## 2019-10-27 DIAGNOSIS — N952 Postmenopausal atrophic vaginitis: Secondary | ICD-10-CM | POA: Diagnosis not present

## 2019-11-03 DIAGNOSIS — R102 Pelvic and perineal pain: Secondary | ICD-10-CM | POA: Diagnosis not present

## 2019-11-03 DIAGNOSIS — N3020 Other chronic cystitis without hematuria: Secondary | ICD-10-CM | POA: Diagnosis not present

## 2019-11-03 DIAGNOSIS — H2512 Age-related nuclear cataract, left eye: Secondary | ICD-10-CM | POA: Diagnosis not present

## 2019-11-03 DIAGNOSIS — N393 Stress incontinence (female) (male): Secondary | ICD-10-CM | POA: Diagnosis not present

## 2019-11-04 DIAGNOSIS — H2512 Age-related nuclear cataract, left eye: Secondary | ICD-10-CM | POA: Diagnosis not present

## 2019-11-24 NOTE — Progress Notes (Signed)
Patient Care Team: Vernie Shanks, MD as PCP - General (Family Medicine) Nicholas Lose, MD as Consulting Physician (Hematology and Oncology) Kyung Rudd, MD as Consulting Physician (Radiation Oncology) Stark Klein, MD as Consulting Physician (General Surgery)  DIAGNOSIS:    ICD-10-CM   1. Ductal carcinoma in situ (DCIS) of right breast  D05.11     SUMMARY OF ONCOLOGIC HISTORY: Oncology History  Ductal carcinoma in situ (DCIS) of right breast  11/01/2018 Initial Diagnosis   Routine screening mammogram detected calcifications in the LIQ spanning 1.6cm. 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    12/24/2018 Surgery   Right lumpectomy Barry Dienes) [phone]): DCIS, intermediate grade, 1.0cm, clear margins. No regional lymph nodes were examined.   12/24/2018 Cancer Staging   Staging form: Breast, AJCC 8th Edition - Pathologic stage from 12/24/2018: Stage 0 (pTis (DCIS), pN0, cM0, ER+, PR+)    01/20/2019 - 02/18/2019 Radiation Therapy   The patient initially received a dose of 42.56 Gy in 16 fractions to the breast using whole-breast tangent fields. This was delivered using a 3-D conformal technique. The patient then received a boost to the seroma. This delivered an additional 8 Gy in 56fractions using a 3 field photon technique due to the depth of the seroma. The total dose was 50.56 Gy.   02/2019 - 02/2024 Anti-estrogen oral therapy   Tamoxifen     CHIEF COMPLIANT: Follow-up of right breast DCIS on tamoxifen  INTERVAL HISTORY: Kara Ramos is a 69 y.o. with above-mentioned history of right breast DCIS who underwent a lumpectomy, radiation, and is currently on antiestrogen therapy with tamoxifen. Mammogram on 10/22/19 showed no evidence of malignancy bilaterally. She presents to the clinic today for follow-up.  She had profound hot flashes initially.  She cut down the dosage in half and that improved.  She is starting to take a full tablet  again and does not flashes are not as bad as before.  She still has some nights which are bad but overall she is able to handle the tamoxifen fairly well.  ALLERGIES:  is allergic to azithromycin, erythromycin, tape, and tessalon [benzonatate].  MEDICATIONS:  Current Outpatient Medications  Medication Sig Dispense Refill  . albuterol (PROAIR HFA) 108 (90 Base) MCG/ACT inhaler Inhale 2 puffs into the lungs every 4 (four) hours as needed for wheezing or shortness of breath (coughing). 18 g 1  . Azelastine HCl 0.15 % SOLN 1-2 sprays per nostril 1-2 times a day as needed for runny nose. (Patient taking differently: Place 1-2 sprays into both nostrils 2 (two) times daily as needed (runny nose). ) 30 mL 5  . Cranberry 500 MG TABS Take 500 mg by mouth daily.     Marland Kitchen ibuprofen (ADVIL) 200 MG tablet Take 400 mg by mouth every 6 (six) hours as needed for moderate pain.     . metoprolol succinate (TOPROL-XL) 25 MG 24 hr tablet TAKE 1 TABLET BY MOUTH EVERY DAY 60 tablet 3  . Omeprazole (PRILOSEC PO) Take 20 mg by mouth daily.     . Polyethylene Glycol 400 (BLINK TEARS OP) Place 1 drop into both eyes 2 (two) times daily as needed (dry eyes).     . Probiotic Product (PROBIOTIC PO) Take 1 tablet by mouth daily.    . tamoxifen (NOLVADEX) 20 MG tablet Take 1 tablet (20 mg total) by mouth daily. 90 tablet 3  . triamcinolone (NASACORT ALLERGY 24HR) 55 MCG/ACT AERO nasal inhaler Place 1 spray into the nose  daily.     No current facility-administered medications for this visit.    PHYSICAL EXAMINATION: ECOG PERFORMANCE STATUS: 1 - Symptomatic but completely ambulatory  Vitals:   11/25/19 0927  BP: 132/78  Pulse: (!) 59  Resp: 18  Temp: 98.3 F (36.8 C)  SpO2: 99%   Filed Weights   11/25/19 0927  Weight: 181 lb 14.4 oz (82.5 kg)    BREAST: No palpable masses or nodules in either right or left breasts. No palpable axillary supraclavicular or infraclavicular adenopathy no breast tenderness or nipple  discharge. (exam performed in the presence of a chaperone)  LABORATORY DATA:  I have reviewed the data as listed CMP Latest Ref Rng & Units 12/19/2018 11/06/2018 04/17/2007  Glucose 70 - 99 mg/dL 97 77 91  BUN 8 - 23 mg/dL 15 18 10   Creatinine 0.44 - 1.00 mg/dL 0.98 0.91 0.8  Sodium 135 - 145 mmol/L 139 139 140  Potassium 3.5 - 5.1 mmol/L 4.4 4.1 4.0  Chloride 98 - 111 mmol/L 103 104 103  CO2 22 - 32 mmol/L 28 25 29   Calcium 8.9 - 10.3 mg/dL 9.8 9.4 9.5  Total Protein 6.5 - 8.1 g/dL - 6.6 6.8  Total Bilirubin 0.3 - 1.2 mg/dL - 0.6 0.5  Alkaline Phos 38 - 126 U/L - 81 63  AST 15 - 41 U/L - 17 17  ALT 0 - 44 U/L - 13 15    Lab Results  Component Value Date   WBC 5.7 12/19/2018   HGB 13.8 12/19/2018   HCT 42.7 12/19/2018   MCV 89.7 12/19/2018   PLT 242 12/19/2018   NEUTROABS 3.6 11/06/2018    ASSESSMENT & PLAN:  Ductal carcinoma in situ (DCIS) of right breast 12/24/2018: Right lumpectomy by Dr. Barry Dienes: Intermediate grade DCIS, 1 cm, margins clear, ER 95%, PR 60%, Tis NX stage 0  Treatment plan: 1.Adjuvant radiation therapy 01/21/2019-02/18/2019 2.Adjuvant antiestrogen therapy with tamoxifen x5 years.  03/05/2019  Tamoxifen toxicities: Moderate to severe hot flashes: These hot flashes have reduced over time.  She is able to tolerate 1 tablet daily but I discussed with her if it gets worse then she could cut the dose in half  Frequent UTIs: She is contemplating on using Premarin.  I discussed with her that Premarin would be reasonable to use given the fact that she is on tamoxifen.  Breast cancer surveillance: 1.  Breast exam: Benign 2. mammogram 10/22/2019: Benign breast density category D  Return to clinic in 1 year for follow-up    No orders of the defined types were placed in this encounter.  The patient has a good understanding of the overall plan. she agrees with it. she will call with any problems that may develop before the next visit here.  Total time spent: 20  mins including face to face time and time spent for planning, charting and coordination of care  Nicholas Lose, MD 11/25/2019  I, Cloyde Reams Dorshimer, am acting as scribe for Dr. Nicholas Lose.  I have reviewed the above documentation for accuracy and completeness, and I agree with the above.

## 2019-11-25 ENCOUNTER — Inpatient Hospital Stay: Payer: Medicare Other | Attending: Hematology and Oncology | Admitting: Hematology and Oncology

## 2019-11-25 ENCOUNTER — Other Ambulatory Visit: Payer: Self-pay

## 2019-11-25 DIAGNOSIS — Z79810 Long term (current) use of selective estrogen receptor modulators (SERMs): Secondary | ICD-10-CM | POA: Insufficient documentation

## 2019-11-25 DIAGNOSIS — D0511 Intraductal carcinoma in situ of right breast: Secondary | ICD-10-CM | POA: Insufficient documentation

## 2019-11-25 DIAGNOSIS — M62838 Other muscle spasm: Secondary | ICD-10-CM | POA: Diagnosis not present

## 2019-11-25 DIAGNOSIS — Z170 Estrogen receptor positive status [ER+]: Secondary | ICD-10-CM | POA: Insufficient documentation

## 2019-11-25 DIAGNOSIS — M6289 Other specified disorders of muscle: Secondary | ICD-10-CM | POA: Diagnosis not present

## 2019-11-25 DIAGNOSIS — N3946 Mixed incontinence: Secondary | ICD-10-CM | POA: Diagnosis not present

## 2019-11-25 DIAGNOSIS — Z79899 Other long term (current) drug therapy: Secondary | ICD-10-CM | POA: Diagnosis not present

## 2019-11-25 DIAGNOSIS — M6281 Muscle weakness (generalized): Secondary | ICD-10-CM | POA: Diagnosis not present

## 2019-11-25 MED ORDER — NITROFURANTOIN MACROCRYSTAL 50 MG PO CAPS: 50.0000 mg | ORAL_CAPSULE | Freq: Every day | ORAL | Status: DC

## 2019-11-25 MED ORDER — TAMOXIFEN CITRATE 20 MG PO TABS: 20.0000 mg | ORAL_TABLET | Freq: Every day | ORAL | 3 refills | Status: DC

## 2019-11-25 MED ORDER — LORATADINE 10 MG PO TABS: 10.0000 mg | ORAL_TABLET | Freq: Every day | ORAL | Status: DC

## 2019-11-25 NOTE — Assessment & Plan Note (Signed)
12/24/2018: Right lumpectomy by Dr. Barry Dienes: Intermediate grade DCIS, 1 cm, margins clear, ER 95%, PR 60%, Tis NX stage 0  Treatment plan: 1.Adjuvant radiation therapy 01/21/2019-02/18/2019 2.Adjuvant antiestrogen therapy with tamoxifen x5 years.  03/05/2019  Tamoxifen toxicities:  Breast cancer surveillance: 1.  Breast exam 11/25/2019: Benign 2. mammogram 10/22/2019: Benign breast density category D  Return to clinic in 1 year for follow-up

## 2019-12-02 DIAGNOSIS — H524 Presbyopia: Secondary | ICD-10-CM | POA: Diagnosis not present

## 2019-12-04 DIAGNOSIS — M62838 Other muscle spasm: Secondary | ICD-10-CM | POA: Diagnosis not present

## 2019-12-04 DIAGNOSIS — M6289 Other specified disorders of muscle: Secondary | ICD-10-CM | POA: Diagnosis not present

## 2019-12-04 DIAGNOSIS — N3946 Mixed incontinence: Secondary | ICD-10-CM | POA: Diagnosis not present

## 2019-12-04 DIAGNOSIS — M6281 Muscle weakness (generalized): Secondary | ICD-10-CM | POA: Diagnosis not present

## 2019-12-09 DIAGNOSIS — R102 Pelvic and perineal pain: Secondary | ICD-10-CM | POA: Diagnosis not present

## 2019-12-09 DIAGNOSIS — M62838 Other muscle spasm: Secondary | ICD-10-CM | POA: Diagnosis not present

## 2019-12-09 DIAGNOSIS — M6289 Other specified disorders of muscle: Secondary | ICD-10-CM | POA: Diagnosis not present

## 2019-12-09 DIAGNOSIS — M6281 Muscle weakness (generalized): Secondary | ICD-10-CM | POA: Diagnosis not present

## 2019-12-24 DIAGNOSIS — M6281 Muscle weakness (generalized): Secondary | ICD-10-CM | POA: Diagnosis not present

## 2019-12-24 DIAGNOSIS — R102 Pelvic and perineal pain: Secondary | ICD-10-CM | POA: Diagnosis not present

## 2019-12-24 DIAGNOSIS — M6289 Other specified disorders of muscle: Secondary | ICD-10-CM | POA: Diagnosis not present

## 2019-12-24 DIAGNOSIS — M62838 Other muscle spasm: Secondary | ICD-10-CM | POA: Diagnosis not present

## 2020-01-05 DIAGNOSIS — N3946 Mixed incontinence: Secondary | ICD-10-CM | POA: Diagnosis not present

## 2020-01-05 DIAGNOSIS — M62838 Other muscle spasm: Secondary | ICD-10-CM | POA: Diagnosis not present

## 2020-01-05 DIAGNOSIS — M6281 Muscle weakness (generalized): Secondary | ICD-10-CM | POA: Diagnosis not present

## 2020-01-05 DIAGNOSIS — M6289 Other specified disorders of muscle: Secondary | ICD-10-CM | POA: Diagnosis not present

## 2020-01-06 DIAGNOSIS — Z0120 Encounter for dental examination and cleaning without abnormal findings: Secondary | ICD-10-CM | POA: Diagnosis not present

## 2020-01-27 DIAGNOSIS — M6289 Other specified disorders of muscle: Secondary | ICD-10-CM | POA: Diagnosis not present

## 2020-01-27 DIAGNOSIS — R102 Pelvic and perineal pain: Secondary | ICD-10-CM | POA: Diagnosis not present

## 2020-01-27 DIAGNOSIS — M62838 Other muscle spasm: Secondary | ICD-10-CM | POA: Diagnosis not present

## 2020-01-27 DIAGNOSIS — M6281 Muscle weakness (generalized): Secondary | ICD-10-CM | POA: Diagnosis not present

## 2020-02-02 DIAGNOSIS — J4520 Mild intermittent asthma, uncomplicated: Secondary | ICD-10-CM | POA: Diagnosis not present

## 2020-02-02 DIAGNOSIS — E7800 Pure hypercholesterolemia, unspecified: Secondary | ICD-10-CM | POA: Diagnosis not present

## 2020-02-02 DIAGNOSIS — I10 Essential (primary) hypertension: Secondary | ICD-10-CM | POA: Diagnosis not present

## 2020-02-02 DIAGNOSIS — C50919 Malignant neoplasm of unspecified site of unspecified female breast: Secondary | ICD-10-CM | POA: Diagnosis not present

## 2020-02-03 DIAGNOSIS — N3021 Other chronic cystitis with hematuria: Secondary | ICD-10-CM | POA: Diagnosis not present

## 2020-02-03 DIAGNOSIS — R102 Pelvic and perineal pain: Secondary | ICD-10-CM | POA: Diagnosis not present

## 2020-02-03 DIAGNOSIS — N393 Stress incontinence (female) (male): Secondary | ICD-10-CM | POA: Diagnosis not present

## 2020-02-09 DIAGNOSIS — N3020 Other chronic cystitis without hematuria: Secondary | ICD-10-CM | POA: Diagnosis not present

## 2020-02-09 DIAGNOSIS — I10 Essential (primary) hypertension: Secondary | ICD-10-CM | POA: Diagnosis not present

## 2020-02-09 DIAGNOSIS — C50919 Malignant neoplasm of unspecified site of unspecified female breast: Secondary | ICD-10-CM | POA: Diagnosis not present

## 2020-02-09 DIAGNOSIS — Z23 Encounter for immunization: Secondary | ICD-10-CM | POA: Diagnosis not present

## 2020-02-12 DIAGNOSIS — L820 Inflamed seborrheic keratosis: Secondary | ICD-10-CM | POA: Diagnosis not present

## 2020-02-12 DIAGNOSIS — Z86018 Personal history of other benign neoplasm: Secondary | ICD-10-CM | POA: Diagnosis not present

## 2020-02-12 DIAGNOSIS — D225 Melanocytic nevi of trunk: Secondary | ICD-10-CM | POA: Diagnosis not present

## 2020-02-12 DIAGNOSIS — D224 Melanocytic nevi of scalp and neck: Secondary | ICD-10-CM | POA: Diagnosis not present

## 2020-02-12 DIAGNOSIS — D2272 Melanocytic nevi of left lower limb, including hip: Secondary | ICD-10-CM | POA: Diagnosis not present

## 2020-02-12 DIAGNOSIS — D2271 Melanocytic nevi of right lower limb, including hip: Secondary | ICD-10-CM | POA: Diagnosis not present

## 2020-02-12 DIAGNOSIS — C4441 Basal cell carcinoma of skin of scalp and neck: Secondary | ICD-10-CM | POA: Diagnosis not present

## 2020-02-23 DIAGNOSIS — M62838 Other muscle spasm: Secondary | ICD-10-CM | POA: Diagnosis not present

## 2020-02-23 DIAGNOSIS — M6281 Muscle weakness (generalized): Secondary | ICD-10-CM | POA: Diagnosis not present

## 2020-02-23 DIAGNOSIS — R102 Pelvic and perineal pain: Secondary | ICD-10-CM | POA: Diagnosis not present

## 2020-02-23 DIAGNOSIS — M6289 Other specified disorders of muscle: Secondary | ICD-10-CM | POA: Diagnosis not present

## 2020-03-16 DIAGNOSIS — M62838 Other muscle spasm: Secondary | ICD-10-CM | POA: Diagnosis not present

## 2020-03-16 DIAGNOSIS — N3946 Mixed incontinence: Secondary | ICD-10-CM | POA: Diagnosis not present

## 2020-03-16 DIAGNOSIS — M6289 Other specified disorders of muscle: Secondary | ICD-10-CM | POA: Diagnosis not present

## 2020-03-16 DIAGNOSIS — M6281 Muscle weakness (generalized): Secondary | ICD-10-CM | POA: Diagnosis not present

## 2020-04-08 DIAGNOSIS — J4520 Mild intermittent asthma, uncomplicated: Secondary | ICD-10-CM | POA: Diagnosis not present

## 2020-04-08 DIAGNOSIS — E7800 Pure hypercholesterolemia, unspecified: Secondary | ICD-10-CM | POA: Diagnosis not present

## 2020-04-08 DIAGNOSIS — C50919 Malignant neoplasm of unspecified site of unspecified female breast: Secondary | ICD-10-CM | POA: Diagnosis not present

## 2020-04-08 DIAGNOSIS — J40 Bronchitis, not specified as acute or chronic: Secondary | ICD-10-CM | POA: Diagnosis not present

## 2020-04-20 DIAGNOSIS — C4441 Basal cell carcinoma of skin of scalp and neck: Secondary | ICD-10-CM | POA: Diagnosis not present

## 2020-04-20 DIAGNOSIS — L988 Other specified disorders of the skin and subcutaneous tissue: Secondary | ICD-10-CM | POA: Diagnosis not present

## 2020-04-20 DIAGNOSIS — D224 Melanocytic nevi of scalp and neck: Secondary | ICD-10-CM | POA: Diagnosis not present

## 2020-05-04 DIAGNOSIS — I10 Essential (primary) hypertension: Secondary | ICD-10-CM | POA: Diagnosis not present

## 2020-05-04 DIAGNOSIS — N390 Urinary tract infection, site not specified: Secondary | ICD-10-CM | POA: Diagnosis not present

## 2020-05-04 DIAGNOSIS — C50919 Malignant neoplasm of unspecified site of unspecified female breast: Secondary | ICD-10-CM | POA: Diagnosis not present

## 2020-05-04 DIAGNOSIS — N3020 Other chronic cystitis without hematuria: Secondary | ICD-10-CM | POA: Diagnosis not present

## 2020-05-04 DIAGNOSIS — M85851 Other specified disorders of bone density and structure, right thigh: Secondary | ICD-10-CM | POA: Diagnosis not present

## 2020-06-09 DIAGNOSIS — R8271 Bacteriuria: Secondary | ICD-10-CM | POA: Diagnosis not present

## 2020-06-09 DIAGNOSIS — N3020 Other chronic cystitis without hematuria: Secondary | ICD-10-CM | POA: Diagnosis not present

## 2020-06-09 DIAGNOSIS — N3021 Other chronic cystitis with hematuria: Secondary | ICD-10-CM | POA: Diagnosis not present

## 2020-06-24 DIAGNOSIS — Z90710 Acquired absence of both cervix and uterus: Secondary | ICD-10-CM | POA: Diagnosis not present

## 2020-06-24 DIAGNOSIS — R310 Gross hematuria: Secondary | ICD-10-CM | POA: Diagnosis not present

## 2020-07-07 ENCOUNTER — Ambulatory Visit: Payer: Medicare Other | Admitting: Allergy

## 2020-07-09 ENCOUNTER — Ambulatory Visit: Payer: Medicare Other | Admitting: Allergy

## 2020-07-09 ENCOUNTER — Encounter: Payer: Self-pay | Admitting: Emergency Medicine

## 2020-07-09 ENCOUNTER — Encounter: Payer: Self-pay | Admitting: Allergy

## 2020-07-09 ENCOUNTER — Other Ambulatory Visit: Payer: Self-pay

## 2020-07-09 VITALS — BP 116/76 | HR 60 | Resp 20 | Ht 61.7 in | Wt 179.8 lb

## 2020-07-09 DIAGNOSIS — J3089 Other allergic rhinitis: Secondary | ICD-10-CM | POA: Diagnosis not present

## 2020-07-09 DIAGNOSIS — J4520 Mild intermittent asthma, uncomplicated: Secondary | ICD-10-CM | POA: Diagnosis not present

## 2020-07-09 DIAGNOSIS — T781XXD Other adverse food reactions, not elsewhere classified, subsequent encounter: Secondary | ICD-10-CM

## 2020-07-09 MED ORDER — ALBUTEROL SULFATE HFA 108 (90 BASE) MCG/ACT IN AERS: 2.0000 | INHALATION_SPRAY | RESPIRATORY_TRACT | 1 refills | Status: DC | PRN

## 2020-07-09 NOTE — Progress Notes (Signed)
Follow Up Note  RE: [name] MRN: [id number] DOB: [birth year] Date of Office Visit: 07/09/2020  Referring provider: Vernie Shanks, MD Primary care provider: Vernie Shanks, MD  Chief Complaint: Asthma  History of Present Illness: I had the pleasure of seeing Kara Ramos for a follow up visit at the [facility] on 07/09/2020. She is a 70 y.o. female, who is being followed for asthma, allergic rhinitis and adverse food reaction. Her previous allergy office visit was on 07/09/2019 with Dr. Maudie Mercury. Today is a regular follow up visit.  Mild intermittent asthma Using albuterol prior to exertion with good benefit. Sometimes has to use it when it's humid and muggy outside like today.  Denies any ER/urgent care visits or prednisone use since the last visit.  CT chest showed a lung nodule and going to see pulmonology next month.  Patient has history of breast cancer and cervical cancer and strong family history of lung cancer.  Other allergic rhinitis Some PND. Currently takes Claritin daily and Nasacort prn with good benefit.  Does not like to use azelastine as the taste makes her nauseous.   Adverse food reaction Limiting wheat and onions.   Tolerates shellfish, and oats.  No major reactions.  Assessment and Plan: [name] is a 70 y.o. female with: Mild intermittent asthma without complication Past history - Used to have asthma as a teenager now only flares with URIs. 2020 spirometry was normal. Interim history - humid weather and exertion triggers symptoms. Lung nodule found on recent imagining - going to see pulm due to strong personal and family history of cancer.   Today's spirometry showed some mild obstruction - patient states it's the humidity today.  May use albuterol rescue inhaler 2 puffs every 4 to 6 hours as needed for shortness of breath, chest tightness, coughing, and wheezing. May use albuterol rescue inhaler 2 puffs 5 to 15 minutes  prior to strenuous physical activities. Monitor frequency of use.   Other allergic rhinitis Past history - Perennial rhinitis symptoms since teenage years.  Last skin testing was over 50 years ago which showed multiple positives per patient report. Used to be on allergy immunotherapy as a teenager.  Interim history - PND at times but does not like to use azelastine nasal spray. Claritin works.   Use over the counter antihistamines such as Zyrtec (cetirizine), Claritin (loratadine), Allegra (fexofenadine), or Xyzal (levocetirizine) daily as needed. May take twice a day during allergy flares. May switch antihistamines every few months.  May use Nasacort 1 spray 1-2 times a day as needed for nasal congestion.   If symptoms worsen then consider environmental allergy testing in future.   Adverse food reaction Past history - Diagnosed with IBS and noticing worsening abdominal pains and diarrhea the last few years. Skin testing as a teenager was positive to milk, corn, wheat, grains, coffee, grapefruit, shrimp per patient report. H/o distal sigmoid resection. Follows with GI. 2020 skin testing showed: Borderline positive to wheat, shellfish mix, trout, oat, rye, onion and pineapple.  Interim history - tolerates shellfish, oats; limiting wheat and onion intake. No reactions.   Continue to avoid foods that bother you.   For mild symptoms you can take over the counter antihistamines such as Benadryl and monitor symptoms closely. If symptoms worsen or if you have severe symptoms including breathing issues, throat closure, significant swelling, whole body hives, severe diarrhea and vomiting, lightheadedness then seek immediate medical care.  Return in about 1 year (  around 07/09/2021).  Meds ordered this encounter  Medications  . albuterol (PROAIR HFA) 108 (90 Base) MCG/ACT inhaler    Sig: Inhale 2 puffs into the lungs every 4 (four) hours as needed for wheezing or shortness of breath (coughing).     Dispense:  18 g    Refill:  1   Lab Orders  No laboratory test(s) ordered today    Diagnostics: Spirometry:  Tracings reviewed. Her effort: Good reproducible efforts. FVC: 2.90L FEV1: 1.93L, 90% predicted FEV1/FVC ratio: 67% Interpretation: Spirometry consistent with mild obstructive disease.  Please see scanned spirometry results for details.  Medication List:  Current Outpatient Medications  Medication Sig Dispense Refill  . Calcium Citrate-Vitamin D (CALCIUM + D PO) Take by mouth.    . Cholecalciferol (VITAMIN D3 PO) Take by mouth.    . Cranberry 500 MG TABS Take 500 mg by mouth daily.     Marland Kitchen desonide (DESOWEN) 0.05 % ointment Apply topically 2 (two) times daily.    Marland Kitchen ibuprofen (ADVIL) 200 MG tablet Take 400 mg by mouth every 6 (six) hours as needed for moderate pain.     Marland Kitchen loratadine (CLARITIN) 10 MG tablet Take 1 tablet (10 mg total) by mouth daily.    . metoprolol succinate (TOPROL-XL) 25 MG 24 hr tablet TAKE 1 TABLET BY MOUTH EVERY DAY 60 tablet 3  . omeprazole (PRILOSEC) 20 MG capsule Take 1 capsule by mouth daily.    . Polyethylene Glycol 400 (BLINK TEARS OP) Place 1 drop into both eyes 2 (two) times daily as needed (dry eyes).     Marland Kitchen PREMARIN vaginal cream Place vaginally.    . Probiotic Product (PROBIOTIC PO) Take 1 tablet by mouth daily.    Marland Kitchen sulfamethoxazole-trimethoprim (BACTRIM) 400-80 MG tablet Take 0.5 tablets by mouth daily.    . tamoxifen (NOLVADEX) 20 MG tablet Take 1 tablet (20 mg total) by mouth daily. 90 tablet 3  . triamcinolone (NASACORT) 55 MCG/ACT AERO nasal inhaler Place 1 spray into the nose daily.    Marland Kitchen albuterol (PROAIR HFA) 108 (90 Base) MCG/ACT inhaler Inhale 2 puffs into the lungs every 4 (four) hours as needed for wheezing or shortness of breath (coughing). 18 g 1   No current facility-administered medications for this visit.   Allergies: Allergies  Allergen Reactions  . Azithromycin Rash and Other (See Comments)    Pt stated, "Lips turn numb  and itching"   . Erythromycin Nausea And Vomiting and Rash  . Tape     Caused redness and peeling  . Tessalon [Benzonatate] Itching and Rash   I reviewed her past medical history, social history, family history, and environmental history and no significant changes have been reported from her previous visit.  Review of Systems  Constitutional: Negative for appetite change, chills, fever and unexpected weight change.       Hot flashes due to menopause  HENT: Negative for congestion and rhinorrhea.   Eyes: Negative for itching.  Respiratory: Negative for cough, chest tightness, shortness of breath and wheezing.   Cardiovascular: Negative for chest pain.  Gastrointestinal: Negative for abdominal pain.  Genitourinary: Negative for difficulty urinating.  Skin: Negative for rash.  Allergic/Immunologic: Positive for environmental allergies.  Neurological: Negative for headaches.   Objective: BP 116/76   Pulse 60   Resp 20   Ht 5' 1.7" (1.567 m)   Wt 179 lb 12.8 oz (81.6 kg)   SpO2 96%   BMI 33.21 kg/m  Body mass index is 33.21 kg/m. Physical  Exam Vitals and nursing note reviewed.  Constitutional:      Appearance: She is well-developed.  HENT:     Head: Normocephalic and atraumatic.     Right Ear: External ear normal.     Left Ear: External ear normal.     Nose: Nose normal.  Eyes:     Conjunctiva/sclera: Conjunctivae normal.  Cardiovascular:     Rate and Rhythm: Normal rate and regular rhythm.     Heart sounds: Normal heart sounds. No murmur heard. No friction rub. No gallop.   Pulmonary:     Effort: Pulmonary effort is normal.     Breath sounds: Normal breath sounds. No wheezing or rales.  Abdominal:     Palpations: Abdomen is soft.  Musculoskeletal:     Cervical back: Neck supple.  Skin:    General: Skin is warm.     Findings: No rash.  Neurological:     Mental Status: She is alert and oriented to person, place, and time.  Psychiatric:        Behavior: Behavior  normal.    Previous notes and tests were reviewed. The plan was reviewed with the patient/family, and all questions/concerned were addressed.  It was my pleasure to see Kara Ramos today and participate in her care. Please feel free to contact me with any questions or concerns.  Sincerely,  Rexene Alberts, DO Allergy & Immunology  [facility] office: [place] office: [phone]

## 2020-07-09 NOTE — Assessment & Plan Note (Signed)
Past history - Diagnosed with IBS and noticing worsening abdominal pains and diarrhea the last few years. Skin testing as a teenager was positive to milk, corn, wheat, grains, coffee, grapefruit, shrimp per patient report. H/o distal sigmoid resection. Follows with GI. 2020 skin testing showed: Borderline positive to wheat, shellfish mix, trout, oat, rye, onion and pineapple.  Interim history - tolerates shellfish, oats; limiting wheat and onion intake. No reactions.   Continue to avoid foods that bother you.   For mild symptoms you can take over the counter antihistamines such as Benadryl and monitor symptoms closely. If symptoms worsen or if you have severe symptoms including breathing issues, throat closure, significant swelling, whole body hives, severe diarrhea and vomiting, lightheadedness then seek immediate medical care.

## 2020-07-09 NOTE — Assessment & Plan Note (Signed)
Past history - Perennial rhinitis symptoms since teenage years.  Last skin testing was over 50 years ago which showed multiple positives per patient report. Used to be on allergy immunotherapy as a teenager.  Interim history - PND at times but does not like to use azelastine nasal spray. Claritin works.   Use over the counter antihistamines such as Zyrtec (cetirizine), Claritin (loratadine), Allegra (fexofenadine), or Xyzal (levocetirizine) daily as needed. May take twice a day during allergy flares. May switch antihistamines every few months.  May use Nasacort 1 spray 1-2 times a day as needed for nasal congestion.   If symptoms worsen then consider environmental allergy testing in future.

## 2020-07-09 NOTE — Assessment & Plan Note (Signed)
Past history - Used to have asthma as a teenager now only flares with URIs. 2020 spirometry was normal. Interim history - humid weather and exertion triggers symptoms. Lung nodule found on recent imagining - going to see pulm due to strong personal and family history of cancer.   Today's spirometry showed some mild obstruction - patient states it's the humidity today.  May use albuterol rescue inhaler 2 puffs every 4 to 6 hours as needed for shortness of breath, chest tightness, coughing, and wheezing. May use albuterol rescue inhaler 2 puffs 5 to 15 minutes prior to strenuous physical activities. Monitor frequency of use.

## 2020-07-09 NOTE — Patient Instructions (Addendum)
Mild intermittent asthma  May use albuterol rescue inhaler 2 puffs every 4 to 6 hours as needed for shortness of breath, chest tightness, coughing, and wheezing. May use albuterol rescue inhaler 2 puffs 5 to 15 minutes prior to strenuous physical activities. Monitor frequency of use.   Other allergic rhinitis  Use over the counter antihistamines such as Zyrtec (cetirizine), Claritin (loratadine), Allegra (fexofenadine), or Xyzal (levocetirizine) daily as needed. May take twice a day during allergy flares. May switch antihistamines every few months.  May use Nasacort 1 spray 1-2 times a day as needed for nasal congestion.   If symptoms worsen then consider environmental allergy testing in future.   Food   Past skin testing showed: Borderline positive to wheat, shellfish mix, trout, oat, rye, onion and pineapple.   Continue to avoid foods that bother you.   For mild symptoms you can take over the counter antihistamines such as Benadryl and monitor symptoms closely. If symptoms worsen or if you have severe symptoms including breathing issues, throat closure, significant swelling, whole body hives, severe diarrhea and vomiting, lightheadedness then seek immediate medical care.  Follow up in 12 months or sooner if needed.   Reducing Pollen Exposure . Pollen seasons: trees (spring), grass (summer) and ragweed/weeds (fall). Marland Kitchen Keep windows closed in your home and car to lower pollen exposure.  Kara Ramos air conditioning in the bedroom and throughout the house if possible.  . Avoid going out in dry windy days - especially early morning. . Pollen counts are highest between 5 - 10 AM and on dry, hot and windy days.  . Save outside activities for late afternoon or after a heavy rain, when pollen levels are lower.  . Avoid mowing of grass if you have grass pollen allergy. Marland Kitchen Be aware that pollen can also be transported indoors on people and pets.  . Dry your clothes in an automatic dryer rather  than hanging them outside where they might collect pollen.  . Rinse hair and eyes before bedtime. Marland Kitchen

## 2020-07-13 DIAGNOSIS — J40 Bronchitis, not specified as acute or chronic: Secondary | ICD-10-CM | POA: Diagnosis not present

## 2020-07-13 DIAGNOSIS — E7800 Pure hypercholesterolemia, unspecified: Secondary | ICD-10-CM | POA: Diagnosis not present

## 2020-07-13 DIAGNOSIS — I10 Essential (primary) hypertension: Secondary | ICD-10-CM | POA: Diagnosis not present

## 2020-07-13 DIAGNOSIS — J4520 Mild intermittent asthma, uncomplicated: Secondary | ICD-10-CM | POA: Diagnosis not present

## 2020-07-26 DIAGNOSIS — N3020 Other chronic cystitis without hematuria: Secondary | ICD-10-CM | POA: Diagnosis not present

## 2020-08-11 ENCOUNTER — Ambulatory Visit: Payer: Medicare Other | Admitting: Emergency Medicine

## 2020-08-11 ENCOUNTER — Other Ambulatory Visit (INDEPENDENT_AMBULATORY_CARE_PROVIDER_SITE_OTHER): Payer: Medicare Other

## 2020-08-11 ENCOUNTER — Other Ambulatory Visit: Payer: Self-pay

## 2020-08-11 ENCOUNTER — Encounter: Payer: Self-pay | Admitting: Emergency Medicine

## 2020-08-11 VITALS — BP 136/84 | HR 66 | Temp 98.7°F | Ht 62.0 in | Wt 180.0 lb

## 2020-08-11 DIAGNOSIS — R918 Other nonspecific abnormal finding of lung field: Secondary | ICD-10-CM | POA: Insufficient documentation

## 2020-08-11 DIAGNOSIS — R911 Solitary pulmonary nodule: Secondary | ICD-10-CM | POA: Diagnosis not present

## 2020-08-11 LAB — BASIC METABOLIC PANEL
BUN: 19 mg/dL (ref 6–23)
CO2: 30 mEq/L (ref 19–32)
Calcium: 9.6 mg/dL (ref 8.4–10.5)
Chloride: 103 mEq/L (ref 96–112)
Creatinine, Ser: 0.97 mg/dL (ref 0.40–1.20)
GFR: 60.28 mL/min (ref >60.00)
Glucose, Bld: 89 mg/dL (ref 70–99)
Potassium: 4.3 mEq/L (ref 3.5–5.1)
Sodium: 140 mEq/L (ref 135–145)

## 2020-08-11 NOTE — Patient Instructions (Signed)
We will perform a CT scan of the chest with contrast to better characterize your pulmonary nodule. Follow Dr. Lamonte Sakai next available after your CT so that we can review the results together and plan follow-up.

## 2020-08-11 NOTE — Assessment & Plan Note (Signed)
6 mm pulmonary nodule found in the right middle lobe spiritually on the CT scan of the abdomen.  She has a history of breast cancer, cervical cancer as well as a family history of lung cancer.  I believe she is at least at moderate risk for malignancy.  We will obtain a dedicated CT chest now to characterize.  Based on this we will determine the interval at which we need to repeat imaging.  If indicated we will plan diagnostic procedure.

## 2020-08-11 NOTE — Progress Notes (Signed)
Subjective:    Patient ID: Kara Ramos, female    DOB: 1950/03/03, 70 y.o.   MRN: [id number]  HPI 70 year old minimal smoker (4 pack years) with a history of cervical cancer (hysterectomy), breast cancer (SGY + XRT), asthma, allergic rhinitis, IBS.  She is referred today for evaluation abnormal CT scan of the chest.  She has a strong family history of lung cancer. 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  No acute findings in the abdomen and pelvis.  Currently asymptomatic.  She can sometimes have some exertional dyspnea with heavier activity.  She also has some dyspnea and wheezing whenever she gets upper respiratory infections, has to use her albuterol at those times.  Denies any chest pain, cough, sputum.   Review of Systems As per HPI  Past Medical History:  Diagnosis Date   Allergy    Arthritis    Asthma    Breast CA ([place])    Breast cancer ([place])    Cancer ([place]) 1978   cervical- had hysterectomy   Complication of anesthesia    per patient 'one time was aware and awake during surgery and also BP can get really low"   GERD (gastroesophageal reflux disease)    History of IBS    Irregular heart rhythm    occasional PAC'S, PVC's   Personal history of radiation therapy      Family History  Problem Relation Age of Onset   Colon cancer Maternal Grandmother    Heart disease Mother    Brain cancer Father    Heart disease Father    Lung cancer Brother    Heart disease Sister    Cancer Paternal Grandmother    Esophageal cancer Neg Hx    Stomach cancer Neg Hx    Rectal cancer Neg Hx      Social History   Socioeconomic History   Marital status: Widowed    Spouse name: Not on file   Number of children: 0   Years of education: Not on file   Highest education level: Not on file  Occupational History   Not on file   Tobacco Use   Smoking status: Former    Packs/day: 1.00    Years: 4.00    Pack years: 4.00    Types: Cigarettes    Quit date: 42    Years since quitting: 45.5   Smokeless tobacco: Never  Vaping Use   Vaping Use: Never used  Substance and Sexual Activity   Alcohol use: Yes    Alcohol/week: 1.0 standard drink    Types: 1 Glasses of wine per week    Comment: occasional   Drug use: No   Sexual activity: Not on file  Other Topics Concern   Not on file  Social History Narrative   Not on file   Social Determinants of Health   Financial Resource Strain: Not on file  Food Insecurity: Not on file  Transportation Needs: Not on file  Physical Activity: Not on file  Stress: Not on file  Social Connections: Not on file  Intimate Partner Violence: Not on file    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    Allergies  Allergen Reactions  Azithromycin Rash and Other (See Comments)    Pt stated, "Lips turn numb and itching"    Erythromycin Nausea And Vomiting and Rash   Tape     Caused redness and peeling   Tessalon [Benzonatate] Itching and Rash     Outpatient Medications Prior to Visit  Medication Sig Dispense Refill   albuterol (PROAIR HFA) 108 (90 Base) MCG/ACT inhaler Inhale 2 puffs into the lungs every 4 (four) hours as needed for wheezing or shortness of breath (coughing). 18 g 1   Calcium Citrate-Vitamin D (CALCIUM + D PO) Take by mouth.     Cholecalciferol (VITAMIN D3 PO) Take by mouth.     Cranberry 500 MG TABS Take 500 mg by mouth daily.      desonide (DESOWEN) 0.05 % ointment Apply topically 2 (two) times daily.     ibuprofen (ADVIL) 200 MG tablet Take 400 mg by mouth every 6 (six) hours as needed for moderate pain.      loratadine (CLARITIN) 10 MG tablet Take 1 tablet (10 mg total) by mouth daily.     metoprolol succinate (TOPROL-XL)  25 MG 24 hr tablet TAKE 1 TABLET BY MOUTH EVERY DAY 60 tablet 3   omeprazole (PRILOSEC) 20 MG capsule Take 1 capsule by mouth daily.     Polyethylene Glycol 400 (BLINK TEARS OP) Place 1 drop into both eyes 2 (two) times daily as needed (dry eyes).      PREMARIN vaginal cream Place vaginally.     Probiotic Product (PROBIOTIC PO) Take 1 tablet by mouth daily.     tamoxifen (NOLVADEX) 20 MG tablet Take 1 tablet (20 mg total) by mouth daily. 90 tablet 3   triamcinolone (NASACORT) 55 MCG/ACT AERO nasal inhaler Place 1 spray into the nose daily.     methenamine (HIPREX) 1 g tablet Take 1 g by mouth 2 (two) times daily.     sulfamethoxazole-trimethoprim (BACTRIM) 400-80 MG tablet Take 0.5 tablets by mouth daily.     No facility-administered medications prior to visit.         Objective:   Physical Exam Vitals:   08/11/20 0910  BP: 136/84  Pulse: 66  Temp: 98.7 F (37.1 C)  TempSrc: Temporal  SpO2: 98%  Weight: 180 lb (81.6 kg)  Height: 5\' 2"  (1.575 m)   Gen: Pleasant, well-nourished, in no distress,  normal affect  ENT: No lesions,  mouth clear,  oropharynx clear, no postnasal drip  Neck: No JVD, no stridor  Lungs: No use of accessory muscles, no crackles or wheezing on normal respiration, no wheeze on forced expiration  Cardiovascular: RRR, heart sounds normal, no murmur or gallops, no peripheral edema  Musculoskeletal: No deformities, no cyanosis or clubbing  Neuro: alert, awake, non focal  Skin: Warm, no lesions or rash      Assessment & Plan:   Pulmonary nodule 6 mm pulmonary nodule found in the right middle lobe spiritually on the CT scan of the abdomen.  She has a history of breast cancer, cervical cancer as well as a family history of lung cancer.  I believe she is at least at moderate risk for malignancy.  We will obtain a dedicated CT chest now to characterize.  Based on this we will determine the interval at which we need to repeat imaging.  If indicated we will  plan diagnostic procedure.   Baltazar Apo, MD, PhD 08/11/2020, 9:38 AM [facility] [phone] or if no answer before 7:00PM call [phone] For  any issues after 7:00PM please call eLink [phone]

## 2020-08-13 LAB — QUANTIFERON-TB GOLD PLUS

## 2020-08-18 ENCOUNTER — Other Ambulatory Visit: Payer: Self-pay

## 2020-08-18 ENCOUNTER — Other Ambulatory Visit: Payer: Medicare Other

## 2020-08-18 ENCOUNTER — Ambulatory Visit (INDEPENDENT_AMBULATORY_CARE_PROVIDER_SITE_OTHER)
Admission: RE | Admit: 2020-08-18 | Discharge: 2020-08-18 | Disposition: A | Discharge status: Home / Self Care | Payer: Medicare Other | Source: Ambulatory Visit | Attending: Emergency Medicine | Admitting: Emergency Medicine

## 2020-08-18 DIAGNOSIS — R918 Other nonspecific abnormal finding of lung field: Secondary | ICD-10-CM | POA: Diagnosis not present

## 2020-08-18 DIAGNOSIS — R911 Solitary pulmonary nodule: Secondary | ICD-10-CM

## 2020-08-18 DIAGNOSIS — Z801 Family history of malignant neoplasm of trachea, bronchus and lung: Secondary | ICD-10-CM | POA: Diagnosis not present

## 2020-08-18 MED ORDER — IOHEXOL 300 MG/ML  SOLN
80.0000 mL | Freq: Once | INTRAMUSCULAR | Status: AC | PRN
  Administered 2020-08-18: 80 mL via INTRAVENOUS

## 2020-08-20 LAB — QUANTIFERON-TB GOLD PLUS
Mitogen-NIL: >10 IU/mL
NIL: 0.03 IU/mL
QuantiFERON-TB Gold Plus: NEGATIVE
TB1-NIL: 0 IU/mL
TB2-NIL: 0 IU/mL

## 2020-09-02 DIAGNOSIS — L578 Other skin changes due to chronic exposure to nonionizing radiation: Secondary | ICD-10-CM | POA: Diagnosis not present

## 2020-09-02 DIAGNOSIS — L820 Inflamed seborrheic keratosis: Secondary | ICD-10-CM | POA: Diagnosis not present

## 2020-09-02 DIAGNOSIS — D224 Melanocytic nevi of scalp and neck: Secondary | ICD-10-CM | POA: Diagnosis not present

## 2020-09-02 DIAGNOSIS — D1801 Hemangioma of skin and subcutaneous tissue: Secondary | ICD-10-CM | POA: Diagnosis not present

## 2020-09-02 DIAGNOSIS — D225 Melanocytic nevi of trunk: Secondary | ICD-10-CM | POA: Diagnosis not present

## 2020-09-16 ENCOUNTER — Ambulatory Visit: Payer: Medicare Other | Admitting: Emergency Medicine

## 2020-09-16 ENCOUNTER — Other Ambulatory Visit: Payer: Self-pay

## 2020-09-16 DIAGNOSIS — R911 Solitary pulmonary nodule: Secondary | ICD-10-CM | POA: Diagnosis not present

## 2020-09-16 NOTE — Progress Notes (Signed)
Subjective:    Patient ID: Kara Ramos, female    DOB: 1951-01-27, 70 y.o.    MRN: [id number]  HPI 70 year old minimal smoker (4 pack years) with a history of cervical cancer (hysterectomy), breast cancer (SGY + XRT), asthma, allergic rhinitis, IBS.  She is referred today for evaluation abnormal CT scan of the chest.  She has a strong family history of lung cancer. 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  No acute findings in the abdomen and pelvis.  Currently asymptomatic.  She can sometimes have some exertional dyspnea with heavier activity.  She also has some dyspnea and wheezing whenever she gets upper respiratory infections, has to use her albuterol at those times.  Denies any chest pain, cough, sputum.  ROV 09/16/20 --follow-up visit for 70 year old woman with a history of cervical cancer and breast cancer, asthma, allergic rhinitis, IBS.  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  She had a right middle lobe nodule found on abdominal CT 06/25/2020.  We performed a CT chest to further characterize.  CT scan of the chest 08/20/2020 reviewed by me, shows a 5 mm right middle lobe pulmonary nodule that is unchanged.  There are no other nodule seen.   Review of Systems As per HPI  Past Medical History:  Diagnosis Date   Allergy    Arthritis    Asthma    Breast CA ([place])    Breast cancer ([place])    Cancer ([place]) 1978   cervical- had hysterectomy   Complication of anesthesia    per patient 'one time was aware and awake during surgery and also BP can get really low"   GERD (gastroesophageal reflux disease)    History of IBS    Irregular heart rhythm    occasional PAC'S, PVC's   Personal history of radiation therapy      Family History  Problem Relation Age of Onset   Colon cancer  Maternal Grandmother    Heart disease Mother    Brain cancer Father    Heart disease Father    Lung cancer Brother    Heart disease Sister    Cancer Paternal Grandmother    Esophageal cancer Neg Hx    Stomach cancer Neg Hx    Rectal cancer Neg Hx      Social History   Socioeconomic History   Marital status: Widowed    Spouse name: Not on file   Number of children: 0   Years of education: Not on file   Highest education level: Not on file  Occupational History   Not on file  Tobacco Use   Smoking status: Former    Packs/day: 1.00    Years: 4.00    Pack years: 4.00    Types: Cigarettes    Quit date: 12    Years since quitting: 45.6   Smokeless tobacco: Never  Vaping Use   Vaping Use: Never used  Substance and Sexual Activity   Alcohol use: Yes    Alcohol/week: 1.0 standard drink    Types: 1 Glasses of wine per week    Comment: occasional   Drug use: No   Sexual activity: Not on file  Other Topics Concern   Not on file  Social History Narrative   Not on  file   Social Determinants of Health   Financial Resource Strain: Not on file  Food Insecurity: Not on file  Transportation Needs: Not on file  Physical Activity: Not on file  Stress: Not on file  Social Connections: Not on file  Intimate Partner Violence: Not on file    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    Allergies  Allergen Reactions   Azithromycin Rash and Other (See Comments)    Pt stated, "Lips turn numb and itching"    Erythromycin Nausea And Vomiting and Rash   Tape     Caused redness and peeling   Tessalon [Benzonatate] Itching and Rash     Outpatient Medications Prior to Visit  Medication Sig Dispense Refill   albuterol (PROAIR HFA) 108 (90 Base) MCG/ACT inhaler Inhale 2 puffs into the lungs every 4 (four) hours as needed for wheezing or shortness of  breath (coughing). 18 g 1   Calcium Citrate-Vitamin D (CALCIUM + D PO) Take by mouth.     Cholecalciferol (VITAMIN D3 PO) Take by mouth.     Cranberry 500 MG TABS Take 500 mg by mouth daily.      desonide (DESOWEN) 0.05 % ointment Apply topically 2 (two) times daily.     ibuprofen (ADVIL) 200 MG tablet Take 400 mg by mouth every 6 (six) hours as needed for moderate pain.      loratadine (CLARITIN) 10 MG tablet Take 1 tablet (10 mg total) by mouth daily.     methenamine (HIPREX) 1 g tablet Take 1 g by mouth 2 (two) times daily.     metoprolol succinate (TOPROL-XL) 25 MG 24 hr tablet TAKE 1 TABLET BY MOUTH EVERY DAY 60 tablet 3   omeprazole (PRILOSEC) 20 MG capsule Take 1 capsule by mouth daily.     Polyethylene Glycol 400 (BLINK TEARS OP) Place 1 drop into both eyes 2 (two) times daily as needed (dry eyes).      PREMARIN vaginal cream Place vaginally.     Probiotic Product (PROBIOTIC PO) Take 1 tablet by mouth daily.     tamoxifen (NOLVADEX) 20 MG tablet Take 1 tablet (20 mg total) by mouth daily. 90 tablet 3   triamcinolone (NASACORT) 55 MCG/ACT AERO nasal inhaler Place 1 spray into the nose daily.     No facility-administered medications prior to visit.         Objective:   Physical Exam Vitals:   09/16/20 1624  BP: 126/76  Pulse: 67  Temp: 98.4 F (36.9 C)  TempSrc: Oral  SpO2: 97%  Weight: 180 lb 3.2 oz (81.7 kg)  Height: '5\' 2"'$  (1.575 m)   Gen: Pleasant, well-nourished, in no distress,  normal affect  ENT: No lesions,  mouth clear,  oropharynx clear, no postnasal drip  Neck: No JVD, no stridor  Lungs: No use of accessory muscles, no crackles or wheezing on normal respiration, no wheeze on forced expiration  Cardiovascular: RRR, heart sounds normal, no murmur or gallops, no peripheral edema  Musculoskeletal: No deformities, no cyanosis or clubbing  Neuro: alert, awake, non focal  Skin: Warm, no lesions or rash      Assessment & Plan:   Pulmonary nodule We  reviewed her CT scan of the chest today.  Her right middle lobe nodule is 5 mm, stable.  No other nodule seen.  Based on her risk  profile, family history, history of malignancy, we have decided to do serial imaging to ensure stability.  Her next scan will be in 12 months.  Follow-up after that scan to review the results together.   Baltazar Apo, MD, PhD 09/16/2020, 5:04 PM [facility] [phone] or if no answer before 7:00PM call [phone] For any issues after 7:00PM please call eLink [phone]

## 2020-09-16 NOTE — Patient Instructions (Signed)
We will plan to repeat your CT scan of the chest without contrast in July 2023 to compare with your prior. Follow with Dr. Lamonte Sakai next July to review the scan together. Call if you develop any respiratory symptoms so that we can evaluate further.

## 2020-09-16 NOTE — Assessment & Plan Note (Signed)
We reviewed her CT scan of the chest today.  Her right middle lobe nodule is 5 mm, stable.  No other nodule seen.  Based on her risk profile, family history, history of malignancy, we have decided to do serial imaging to ensure stability.  Her next scan will be in 12 months.  Follow-up after that scan to review the results together.

## 2020-09-17 ENCOUNTER — Encounter: Payer: Self-pay | Admitting: Hematology and Oncology

## 2020-09-20 ENCOUNTER — Other Ambulatory Visit: Payer: Self-pay | Admitting: Adult Health

## 2020-09-20 ENCOUNTER — Other Ambulatory Visit: Payer: Self-pay | Admitting: Hematology and Oncology

## 2020-09-20 DIAGNOSIS — Z9889 Other specified postprocedural states: Secondary | ICD-10-CM

## 2020-09-20 DIAGNOSIS — Z853 Personal history of malignant neoplasm of breast: Secondary | ICD-10-CM

## 2020-09-23 DIAGNOSIS — J4520 Mild intermittent asthma, uncomplicated: Secondary | ICD-10-CM | POA: Diagnosis not present

## 2020-09-23 DIAGNOSIS — E7800 Pure hypercholesterolemia, unspecified: Secondary | ICD-10-CM | POA: Diagnosis not present

## 2020-09-23 DIAGNOSIS — J40 Bronchitis, not specified as acute or chronic: Secondary | ICD-10-CM | POA: Diagnosis not present

## 2020-09-23 DIAGNOSIS — C50919 Malignant neoplasm of unspecified site of unspecified female breast: Secondary | ICD-10-CM | POA: Diagnosis not present

## 2020-10-12 DIAGNOSIS — E7800 Pure hypercholesterolemia, unspecified: Secondary | ICD-10-CM | POA: Diagnosis not present

## 2020-10-12 DIAGNOSIS — Z0000 Encounter for general adult medical examination without abnormal findings: Secondary | ICD-10-CM | POA: Diagnosis not present

## 2020-10-12 DIAGNOSIS — Z780 Asymptomatic menopausal state: Secondary | ICD-10-CM | POA: Diagnosis not present

## 2020-10-12 DIAGNOSIS — E559 Vitamin D deficiency, unspecified: Secondary | ICD-10-CM | POA: Diagnosis not present

## 2020-10-12 DIAGNOSIS — M85851 Other specified disorders of bone density and structure, right thigh: Secondary | ICD-10-CM | POA: Diagnosis not present

## 2020-10-12 DIAGNOSIS — C50919 Malignant neoplasm of unspecified site of unspecified female breast: Secondary | ICD-10-CM | POA: Diagnosis not present

## 2020-10-12 DIAGNOSIS — I493 Ventricular premature depolarization: Secondary | ICD-10-CM | POA: Diagnosis not present

## 2020-11-04 DIAGNOSIS — J40 Bronchitis, not specified as acute or chronic: Secondary | ICD-10-CM | POA: Diagnosis not present

## 2020-11-04 DIAGNOSIS — J019 Acute sinusitis, unspecified: Secondary | ICD-10-CM | POA: Diagnosis not present

## 2020-11-10 ENCOUNTER — Ambulatory Visit
Admission: RE | Admit: 2020-11-10 | Discharge: 2020-11-10 | Disposition: A | Discharge status: Home / Self Care | Payer: Medicare Other | Source: Ambulatory Visit | Attending: Hematology and Oncology | Admitting: Hematology and Oncology

## 2020-11-10 ENCOUNTER — Other Ambulatory Visit: Payer: Self-pay

## 2020-11-10 DIAGNOSIS — Z853 Personal history of malignant neoplasm of breast: Secondary | ICD-10-CM

## 2020-11-10 DIAGNOSIS — Z9889 Other specified postprocedural states: Secondary | ICD-10-CM

## 2020-11-10 DIAGNOSIS — R922 Inconclusive mammogram: Secondary | ICD-10-CM | POA: Diagnosis not present

## 2020-11-22 DIAGNOSIS — I10 Essential (primary) hypertension: Secondary | ICD-10-CM | POA: Diagnosis not present

## 2020-11-22 DIAGNOSIS — J40 Bronchitis, not specified as acute or chronic: Secondary | ICD-10-CM | POA: Diagnosis not present

## 2020-11-22 DIAGNOSIS — E7800 Pure hypercholesterolemia, unspecified: Secondary | ICD-10-CM | POA: Diagnosis not present

## 2020-11-22 DIAGNOSIS — J4520 Mild intermittent asthma, uncomplicated: Secondary | ICD-10-CM | POA: Diagnosis not present

## 2020-11-24 NOTE — Progress Notes (Signed)
Patient Care Team: Vernie Shanks, MD as PCP - General (Family Medicine) Nicholas Lose, MD as Consulting Physician (Hematology and Oncology) Kyung Rudd, MD as Consulting Physician (Radiation Oncology) Stark Klein, MD as Consulting Physician (General Surgery)  DIAGNOSIS:    ICD-10-CM   1. Ductal carcinoma in situ (DCIS) of right breast  D05.11       SUMMARY OF ONCOLOGIC HISTORY: Oncology History  Ductal carcinoma in situ (DCIS) of right breast  11/01/2018 Initial Diagnosis   Routine screening mammogram detected calcifications in the LIQ spanning 1.6cm. 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    12/24/2018 Surgery   Right lumpectomy Barry Dienes) [id number]): DCIS, intermediate grade, 1.0cm, clear margins. No regional lymph nodes were examined.   12/24/2018 Cancer Staging   Staging form: Breast, AJCC 8th Edition - Pathologic stage from 12/24/2018: Stage 0 (pTis (DCIS), pN0, cM0, ER+, PR+)    01/20/2019 - 02/18/2019 Radiation Therapy   The patient initially received a dose of 42.56 Gy in 16 fractions to the breast using whole-breast tangent fields. This was delivered using a 3-D conformal technique. The patient then received a boost to the seroma. This delivered an additional 8 Gy in 76fractions using a 3 field photon technique due to the depth of the seroma. The total dose was 50.56 Gy.   02/2019 - 02/2024 Anti-estrogen oral therapy   Tamoxifen     CHIEF COMPLIANT: Follow-up of right breast DCIS on tamoxifen  INTERVAL HISTORY: Kara Ramos is a 70 y.o. with above-mentioned history of right breast DCIS who underwent a lumpectomy, radiation, and is currently on antiestrogen therapy with tamoxifen. Mammogram on 11/10/2020 showed no evidence of malignancy bilaterally. She presents to the clinic today for follow-up.  She had COVID about 6 weeks ago and since then her hot flashes went away completely.  She denies any lumps or nodules in the  breast.  ALLERGIES:  is allergic to azithromycin, erythromycin, tape, and tessalon [benzonatate].  MEDICATIONS:  Current Outpatient Medications  Medication Sig Dispense Refill   albuterol (PROAIR HFA) 108 (90 Base) MCG/ACT inhaler Inhale 2 puffs into the lungs every 4 (four) hours as needed for wheezing or shortness of breath (coughing). 18 g 1   Calcium Citrate-Vitamin D (CALCIUM + D PO) Take by mouth.     Cholecalciferol (VITAMIN D3 PO) Take by mouth.     Cranberry 500 MG TABS Take 500 mg by mouth daily.      desonide (DESOWEN) 0.05 % ointment Apply topically 2 (two) times daily.     ibuprofen (ADVIL) 200 MG tablet Take 400 mg by mouth every 6 (six) hours as needed for moderate pain.      loratadine (CLARITIN) 10 MG tablet Take 1 tablet (10 mg total) by mouth daily.     methenamine (HIPREX) 1 g tablet Take 1 g by mouth 2 (two) times daily.     metoprolol succinate (TOPROL-XL) 25 MG 24 hr tablet TAKE 1 TABLET BY MOUTH EVERY DAY 60 tablet 3   omeprazole (PRILOSEC) 20 MG capsule Take 1 capsule by mouth daily.     Polyethylene Glycol 400 (BLINK TEARS OP) Place 1 drop into both eyes 2 (two) times daily as needed (dry eyes).      PREMARIN vaginal cream Place vaginally.     Probiotic Product (PROBIOTIC PO) Take 1 tablet by mouth daily.     tamoxifen (NOLVADEX) 20 MG tablet Take 1 tablet (20 mg total) by mouth daily. 90 tablet 3  triamcinolone (NASACORT) 55 MCG/ACT AERO nasal inhaler Place 1 spray into the nose daily.     No current facility-administered medications for this visit.    PHYSICAL EXAMINATION: ECOG PERFORMANCE STATUS: 0 - Asymptomatic  Vitals:   11/25/20 1527  BP: (!) 148/55  Pulse: 88  Resp: 18  Temp: 97.8 F (36.6 C)  SpO2: 97%   Filed Weights   11/25/20 1527  Weight: 180 lb 4.8 oz (81.8 kg)      LABORATORY DATA:  I have reviewed the data as listed CMP Latest Ref Rng & Units 08/11/2020 12/19/2018 11/06/2018  Glucose 70 - 99 mg/dL 89 97 77  BUN 6 - 23 mg/dL 19 15  18   Creatinine 0.40 - 1.20 mg/dL 0.97 0.98 0.91  Sodium 135 - 145 mEq/L 140 139 139  Potassium 3.5 - 5.1 mEq/L 4.3 4.4 4.1  Chloride 96 - 112 mEq/L 103 103 104  CO2 19 - 32 mEq/L 30 28 25   Calcium 8.4 - 10.5 mg/dL 9.6 9.8 9.4  Total Protein 6.5 - 8.1 g/dL - - 6.6  Total Bilirubin 0.3 - 1.2 mg/dL - - 0.6  Alkaline Phos 38 - 126 U/L - - 81  AST 15 - 41 U/L - - 17  ALT 0 - 44 U/L - - 13    Lab Results  Component Value Date   WBC 5.7 12/19/2018   HGB 13.8 12/19/2018   HCT 42.7 12/19/2018   MCV 89.7 12/19/2018   PLT 242 12/19/2018   NEUTROABS 3.6 11/06/2018    ASSESSMENT & PLAN:  Ductal carcinoma in situ (DCIS) of right breast 12/24/2018: Right lumpectomy by Dr. Barry Dienes: Intermediate grade DCIS, 1 cm, margins clear, ER 95%, PR 60%, Tis NX stage 0   Treatment plan: 1.  Adjuvant radiation therapy 01/21/2019-02/18/2019 2.  Adjuvant antiestrogen therapy with tamoxifen x5 years.  03/05/2019   Tamoxifen toxicities: Moderate to severe hot flashes: These have resolved since she had COVID infection 6 weeks ago.   Frequent UTIs: Currently using Premarin.      Breast cancer surveillance: 1.  Breast exam: 11/25/2020: Benign 2. mammogram 11/10/2020: Benign breast density category D   CT chest 08/19/2020: 5 mm right middle lobe lung nodule similar (follows with Dr. Kyung Rudd) Return to clinic in 6 months for physical exam and follow-up and after that we can see her once a year    No orders of the defined types were placed in this encounter.  The patient has a good understanding of the overall plan. she agrees with it. she will call with any problems that may develop before the next visit here.  Total time spent: 20 mins including face to face time and time spent for planning, charting and coordination of care  Rulon Eisenmenger, MD, MPH 11/25/2020  I, Thana Ates, am acting as scribe for Dr. Nicholas Lose.  I have reviewed the above documentation for accuracy and completeness, and I agree  with the above.

## 2020-11-25 ENCOUNTER — Inpatient Hospital Stay: Payer: Medicare Other | Attending: Hematology and Oncology | Admitting: Hematology and Oncology

## 2020-11-25 ENCOUNTER — Other Ambulatory Visit: Payer: Self-pay

## 2020-11-25 DIAGNOSIS — Z170 Estrogen receptor positive status [ER+]: Secondary | ICD-10-CM | POA: Insufficient documentation

## 2020-11-25 DIAGNOSIS — D0511 Intraductal carcinoma in situ of right breast: Secondary | ICD-10-CM | POA: Diagnosis not present

## 2020-11-25 DIAGNOSIS — Z79810 Long term (current) use of selective estrogen receptor modulators (SERMs): Secondary | ICD-10-CM | POA: Insufficient documentation

## 2020-11-25 NOTE — Assessment & Plan Note (Signed)
12/24/2018: Right lumpectomy by Dr. Barry Dienes: Intermediate grade DCIS, 1 cm, margins clear, ER 95%, PR 60%, Tis NX stage 0  Treatment plan: 1.Adjuvant radiation therapy12/09/2018-02/18/2019 2.Adjuvant antiestrogen therapy with tamoxifen x5 years. 03/05/2019  Tamoxifen toxicities: Moderate to severe hot flashes: These hot flashes have reduced over time.  She is able to tolerate 1 tablet daily but I discussed with her if it gets worse then she could cut the dose in half  Frequent UTIs: She is contemplating on using Premarin.  I discussed with her that Premarin would be reasonable to use given the fact that she is on tamoxifen.  Breast cancer surveillance: 1.  Breast exam: 11/25/2020: Benign 2. mammogram 11/10/2020: Benign breast density category D  CT chest 08/19/2020: 5 mm right middle lobe lung nodule similar (follows with Dr. Kyung Rudd) Return to clinic in 1 year for follow-up

## 2020-11-29 DIAGNOSIS — H04123 Dry eye syndrome of bilateral lacrimal glands: Secondary | ICD-10-CM | POA: Diagnosis not present

## 2020-11-29 DIAGNOSIS — H43393 Other vitreous opacities, bilateral: Secondary | ICD-10-CM | POA: Diagnosis not present

## 2020-11-29 DIAGNOSIS — H5213 Myopia, bilateral: Secondary | ICD-10-CM | POA: Diagnosis not present

## 2020-11-30 DIAGNOSIS — R350 Frequency of micturition: Secondary | ICD-10-CM | POA: Diagnosis not present

## 2020-11-30 DIAGNOSIS — Z87440 Personal history of urinary (tract) infections: Secondary | ICD-10-CM | POA: Diagnosis not present

## 2020-12-18 DIAGNOSIS — R509 Fever, unspecified: Secondary | ICD-10-CM | POA: Diagnosis not present

## 2020-12-18 DIAGNOSIS — Z03818 Encounter for observation for suspected exposure to other biological agents ruled out: Secondary | ICD-10-CM | POA: Diagnosis not present

## 2020-12-18 DIAGNOSIS — J0100 Acute maxillary sinusitis, unspecified: Secondary | ICD-10-CM | POA: Diagnosis not present

## 2020-12-18 DIAGNOSIS — R059 Cough, unspecified: Secondary | ICD-10-CM | POA: Diagnosis not present

## 2020-12-18 DIAGNOSIS — J209 Acute bronchitis, unspecified: Secondary | ICD-10-CM | POA: Diagnosis not present

## 2020-12-18 DIAGNOSIS — R051 Acute cough: Secondary | ICD-10-CM | POA: Diagnosis not present

## 2020-12-26 DIAGNOSIS — J22 Unspecified acute lower respiratory infection: Secondary | ICD-10-CM | POA: Diagnosis not present

## 2020-12-26 DIAGNOSIS — R21 Rash and other nonspecific skin eruption: Secondary | ICD-10-CM | POA: Diagnosis not present

## 2021-01-24 DIAGNOSIS — N3020 Other chronic cystitis without hematuria: Secondary | ICD-10-CM | POA: Diagnosis not present

## 2021-01-25 ENCOUNTER — Other Ambulatory Visit: Payer: Self-pay | Admitting: Family Medicine

## 2021-01-25 DIAGNOSIS — R911 Solitary pulmonary nodule: Secondary | ICD-10-CM

## 2021-01-31 DIAGNOSIS — N3020 Other chronic cystitis without hematuria: Secondary | ICD-10-CM | POA: Diagnosis not present

## 2021-02-10 ENCOUNTER — Other Ambulatory Visit: Payer: Self-pay | Admitting: Hematology and Oncology

## 2021-02-18 ENCOUNTER — Ambulatory Visit
Admission: RE | Admit: 2021-02-18 | Discharge: 2021-02-18 | Disposition: A | Discharge status: Home / Self Care | Payer: Medicare Other | Source: Ambulatory Visit | Attending: Family Medicine | Admitting: Family Medicine

## 2021-02-18 ENCOUNTER — Other Ambulatory Visit: Payer: Medicare Other

## 2021-02-18 DIAGNOSIS — R911 Solitary pulmonary nodule: Secondary | ICD-10-CM | POA: Diagnosis not present

## 2021-03-09 ENCOUNTER — Encounter: Payer: Self-pay | Admitting: Emergency Medicine

## 2021-03-09 ENCOUNTER — Other Ambulatory Visit: Payer: Self-pay

## 2021-03-09 ENCOUNTER — Ambulatory Visit: Payer: Medicare Other | Admitting: Emergency Medicine

## 2021-03-09 DIAGNOSIS — R911 Solitary pulmonary nodule: Secondary | ICD-10-CM | POA: Diagnosis not present

## 2021-03-09 DIAGNOSIS — J4520 Mild intermittent asthma, uncomplicated: Secondary | ICD-10-CM | POA: Diagnosis not present

## 2021-03-09 DIAGNOSIS — J3089 Other allergic rhinitis: Secondary | ICD-10-CM

## 2021-03-09 NOTE — Progress Notes (Signed)
Subjective:    Patient ID: Kara Ramos, female    DOB: 1950/12/25, 71 y.o.   MRN: [id number]  HPI 71 year old minimal smoker (4 pack years) with a history of cervical cancer (hysterectomy), breast cancer (SGY + XRT), asthma, allergic rhinitis, IBS.  She is referred today for evaluation abnormal CT scan of the chest.  She has a strong family history of lung cancer. 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  No acute findings in the abdomen and pelvis.  Currently asymptomatic.  She can sometimes have some exertional dyspnea with heavier activity.  She also has some dyspnea and wheezing whenever she gets upper respiratory infections, has to use her albuterol at those times.  Denies any chest pain, cough, sputum.   ROV 09/16/20 --follow-up visit for 71 year old woman with a history of cervical cancer and breast cancer, asthma, allergic rhinitis, IBS.  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  She had a right middle lobe nodule found on abdominal CT 06/25/2020.  We performed a CT chest to further characterize.  CT scan of the chest 08/20/2020 reviewed by me, shows a 5 mm right middle lobe pulmonary nodule that is unchanged.  There are no other nodule seen.   ROV 03/09/21 --Kara Ramos is 71 with a minimal tobacco history.  She does have a history of cervical cancer and breast cancer.  Also IBS, allergic rhinitis, asthma, treated latent TB (childhood).  I been following her for an abnormal CT scan of the chest in the setting of this as well as a strong family history of lung cancer. She had a repeat CT done on 02/18/2021.  We had originally planned to repeat in July 2023.  She reports that she had COVID in September, also was treated for sinusitis and persistent cough all through the Fall and into December.    CT chest 02/18/2021 reviewed by me shows a stable 5 mm right middle lobe pulmonary nodule, stable left lower lobe 6 mm pulmonary nodule.  Also noted are some new scattered small and ill-defined centrilobular groundglass nodular changes bilaterally.  There is no mediastinal or axillary lymphadenopathy.   Review of Systems As per HPI  Past Medical History:  Diagnosis Date   Allergy    Arthritis    Asthma    Breast CA ([place])    Breast cancer ([place])    Cancer ([place]) 1978   cervical- had hysterectomy   Complication of anesthesia    per patient 'one time was aware and awake during surgery and also BP can get really low"   GERD (gastroesophageal reflux disease)    History of IBS    Irregular heart rhythm    occasional PAC'S, PVC's   Personal history of radiation therapy      Family History  Problem Relation Age of Onset   Colon cancer Maternal Grandmother    Heart disease Mother    Brain cancer Father    Heart disease Father    Lung cancer Brother    Heart disease Sister    Cancer Paternal Grandmother    Esophageal cancer Neg Hx    Stomach cancer Neg Hx    Rectal cancer Neg Hx      Social History   Socioeconomic History   Marital status: Widowed  Spouse name: Not on file   Number of children: 0   Years of education: Not on file   Highest education level: Not on file  Occupational History   Not on file  Tobacco Use   Smoking status: Former    Packs/day: 1.00    Years: 4.00    Pack years: 4.00    Types: Cigarettes    Quit date: 21    Years since quitting: 46.0   Smokeless tobacco: Never  Vaping Use   Vaping Use: Never used  Substance and Sexual Activity   Alcohol use: Yes    Alcohol/week: 1.0 standard drink    Types: 1 Glasses of wine per week    Comment: occasional   Drug use: No   Sexual activity: Not on file  Other Topics Concern   Not on file  Social History Narrative   Not on file   Social Determinants of Health   Financial Resource Strain: Not  on file  Food Insecurity: Not on file  Transportation Needs: Not on file  Physical Activity: Not on file  Stress: Not on file  Social Connections: Not on file  Intimate Partner Violence: Not on file    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    Allergies  Allergen Reactions   Azithromycin Rash and Other (See Comments)    Pt stated, "Lips turn numb and itching"    Erythromycin Nausea And Vomiting and Rash   Tape     Caused redness and peeling   Tessalon [Benzonatate] Itching and Rash     Outpatient Medications Prior to Visit  Medication Sig Dispense Refill   albuterol (PROAIR HFA) 108 (90 Base) MCG/ACT inhaler Inhale 2 puffs into the lungs every 4 (four) hours as needed for wheezing or shortness of breath (coughing). 18 g 1   Ascorbic Acid (VITAMIN C) 500 MG CAPS      Calcium Citrate-Vitamin D (CALCIUM + D PO) Take by mouth.     Cholecalciferol (VITAMIN D3 PO) Take by mouth.     Cranberry 500 MG TABS Take 500 mg by mouth daily.      desonide (DESOWEN) 0.05 % ointment Apply topically 2 (two) times daily.     ibuprofen (ADVIL) 200 MG tablet Take 400 mg by mouth every 6 (six) hours as needed for moderate pain.      methenamine (HIPREX) 1 g tablet Take 1 g by mouth 2 (two) times daily.     metoprolol succinate (TOPROL-XL) 25 MG 24 hr tablet TAKE 1 TABLET BY MOUTH EVERY DAY 60 tablet 3   omeprazole (PRILOSEC) 20 MG capsule Take 1 capsule by mouth daily.     Polyethylene Glycol 400 (BLINK TEARS OP) Place 1 drop into both eyes 2 (two) times daily as needed (dry eyes).      PREMARIN vaginal cream Place vaginally.     Probiotic Product (PROBIOTIC PO) Take 1 tablet by mouth daily.     tamoxifen (NOLVADEX) 20 MG tablet TAKE 1 TABLET(20 MG) BY MOUTH DAILY 90 tablet 3   triamcinolone (NASACORT) 55 MCG/ACT AERO nasal inhaler Place 1 spray into the nose daily.      loratadine (CLARITIN) 10 MG tablet Take 1 tablet (10 mg total) by mouth daily.     No facility-administered medications prior to visit.         Objective:   Physical  Exam Vitals:   03/09/21 0928  BP: 120/72  Pulse: 78  Temp: 98.5 F (36.9 C)  TempSrc: Oral  SpO2: 96%  Weight: 178 lb 6.4 oz (80.9 kg)  Height: 5\' 2"  (1.575 m)   Gen: Pleasant, well-nourished, in no distress,  normal affect  ENT: No lesions,  mouth clear,  oropharynx clear, no postnasal drip  Neck: No JVD, no stridor  Lungs: No use of accessory muscles, no crackles or wheezing on normal respiration, no wheeze on forced expiration  Cardiovascular: RRR, heart sounds normal, no murmur or gallops, no peripheral edema  Musculoskeletal: No deformities, no cyanosis or clubbing  Neuro: alert, awake, non focal  Skin: Warm, no lesions or rash      Assessment & Plan:   Pulmonary nodule Following serial CT scans.  Her right middle lobe nodule is stable.  Note was made of a left lower lobe nodule that in retrospect is visible on her prior CT from July, may be a confluence of blood vessels, also stable.  No other concerning nodules.  There were some base predominant bilateral subtle groundglass changes of unclear significance.  Question whether this may be residual from her COVID-19 in September.  Plan will be to repeat her CT chest in 1 year, January 2024 for interval stability.  I would like to have at least 2 years of stability in the solid nodules to establish that they are benign.  Mild intermittent asthma without complication Improved control although she was having significant cough after COVID-19 and into the fall/winter.  Much of this driven by sinus symptoms.  Have recommended that she treat her rhinitis aggressively during the allergy season.  Other allergic rhinitis Currently on Zyrtec.  She knows to start a nasal steroid, ipratropium as needed in the spring.  Time spent 41 minutes.  Baltazar Apo, MD,  PhD 03/09/2021, 9:57 AM [facility] [phone] or if no answer before 7:00PM call [phone] For any issues after 7:00PM please call eLink [phone]

## 2021-03-09 NOTE — Assessment & Plan Note (Signed)
Currently on Zyrtec.  She knows to start a nasal steroid, ipratropium as needed in the spring.

## 2021-03-09 NOTE — Patient Instructions (Signed)
We reviewed your CT scan of the chest today We will plan to repeat your CT scan of the chest in January 2024 Continue Zyrtec Agree with starting nasal steroid spray, ipratropium nasal spray as needed during the allergy season. Keep albuterol available use 2 puffs if needed for shortness of breath, chest tightness, wheezing. Follow with Dr. Lamonte Sakai in 12 months or sooner if you have any problems.

## 2021-03-09 NOTE — Assessment & Plan Note (Signed)
Improved control although she was having significant cough after COVID-19 and into the fall/winter.  Much of this driven by sinus symptoms.  Have recommended that she treat her rhinitis aggressively during the allergy season.

## 2021-03-09 NOTE — Assessment & Plan Note (Signed)
Following serial CT scans.  Her right middle lobe nodule is stable.  Note was made of a left lower lobe nodule that in retrospect is visible on her prior CT from July, may be a confluence of blood vessels, also stable.  No other concerning nodules.  There were some base predominant bilateral subtle groundglass changes of unclear significance.  Question whether this may be residual from her COVID-19 in September.  Plan will be to repeat her CT chest in 1 year, January 2024 for interval stability.  I would like to have at least 2 years of stability in the solid nodules to establish that they are benign.

## 2021-03-09 NOTE — Addendum Note (Signed)
Addended by: Gavin Potters R on: 03/09/2021 10:40 AM   Modules accepted: Orders

## 2021-04-07 DIAGNOSIS — I493 Ventricular premature depolarization: Secondary | ICD-10-CM | POA: Diagnosis not present

## 2021-04-07 DIAGNOSIS — C50919 Malignant neoplasm of unspecified site of unspecified female breast: Secondary | ICD-10-CM | POA: Diagnosis not present

## 2021-04-07 DIAGNOSIS — Z780 Asymptomatic menopausal state: Secondary | ICD-10-CM | POA: Diagnosis not present

## 2021-04-07 DIAGNOSIS — M85851 Other specified disorders of bone density and structure, right thigh: Secondary | ICD-10-CM | POA: Diagnosis not present

## 2021-04-11 DIAGNOSIS — K760 Fatty (change of) liver, not elsewhere classified: Secondary | ICD-10-CM | POA: Diagnosis not present

## 2021-04-11 DIAGNOSIS — E7800 Pure hypercholesterolemia, unspecified: Secondary | ICD-10-CM | POA: Diagnosis not present

## 2021-04-11 DIAGNOSIS — C50919 Malignant neoplasm of unspecified site of unspecified female breast: Secondary | ICD-10-CM | POA: Diagnosis not present

## 2021-04-11 DIAGNOSIS — E559 Vitamin D deficiency, unspecified: Secondary | ICD-10-CM | POA: Diagnosis not present

## 2021-05-04 ENCOUNTER — Telehealth: Payer: Self-pay | Admitting: Hematology and Oncology

## 2021-05-04 NOTE — Telephone Encounter (Signed)
Rescheduled appointment per providers. Patient aware.  ? ?

## 2021-05-10 DIAGNOSIS — N3020 Other chronic cystitis without hematuria: Secondary | ICD-10-CM | POA: Diagnosis not present

## 2021-05-19 NOTE — Progress Notes (Signed)
? ?Patient Care Team: ?Vernie Shanks, MD as PCP - General (Family Medicine) ?Nicholas Lose, MD as Consulting Physician (Hematology and Oncology) ?Kyung Rudd, MD as Consulting Physician (Radiation Oncology) ?Stark Klein, MD as Consulting Physician (General Surgery) ? ?DIAGNOSIS:  ?Encounter Diagnosis  ?Name Primary?  ? Ductal carcinoma in situ (DCIS) of right breast   ? ? ?SUMMARY OF ONCOLOGIC HISTORY: ?Oncology History  ?Ductal carcinoma in situ (DCIS) of right breast  ?11/01/2018 Initial Diagnosis  ? Routine screening mammogram detected calcifications in the LIQ spanning 1.6cm. 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  ?  ?12/24/2018 Surgery  ? Right lumpectomy Barry Dienes) [phone]): DCIS, intermediate grade, 1.0cm, clear margins. No regional lymph nodes were examined. ?  ?12/24/2018 Cancer Staging  ? Staging form: Breast, AJCC 8th Edition ?- Pathologic stage from 12/24/2018: Stage 0 (pTis (DCIS), pN0, cM0, ER+, PR+) ? ?  ?01/20/2019 - 02/18/2019 Radiation Therapy  ? The patient initially received a dose of 42.56 Gy in 16 fractions to the breast using whole-breast tangent fields. This was delivered using a 3-D conformal technique. The patient then received a boost to the seroma. This delivered an additional 8 Gy in 32factions using a 3 field photon technique due to the depth of the seroma. The total dose was 50.56 Gy. ?  ?02/2019 - 02/2024 Anti-estrogen oral therapy  ? Tamoxifen ?  ? ? ?CHIEF COMPLIANT: Follow-up of right breast DCIS on tamoxifen ? ?INTERVAL HISTORY: Kara MINis a 71y.o. with above-mentioned history of right breast DCIS currently on antiestrogen therapy with tamoxifen. She presents to the clinic today for a follow-up. She tolerating the Tamoxifen. Denies no hot flashes. States that UTI has got better. ? ? ?ALLERGIES:  is allergic to azithromycin, erythromycin, tape, and tessalon [benzonatate]. ? ?MEDICATIONS:  ?Current Outpatient Medications   ?Medication Sig Dispense Refill  ? albuterol (PROAIR HFA) 108 (90 Base) MCG/ACT inhaler Inhale 2 puffs into the lungs every 4 (four) hours as needed for wheezing or shortness of breath (coughing). 18 g 1  ? Ascorbic Acid (VITAMIN C) 500 MG CAPS     ? Calcium Citrate-Vitamin D (CALCIUM + D PO) Take by mouth.    ? Cholecalciferol (VITAMIN D3 PO) Take by mouth.    ? Cranberry 500 MG TABS Take 500 mg by mouth daily.     ? desonide (DESOWEN) 0.05 % ointment Apply topically 2 (two) times daily.    ? ibuprofen (ADVIL) 200 MG tablet Take 400 mg by mouth every 6 (six) hours as needed for moderate pain.     ? loratadine (CLARITIN) 10 MG tablet Take 1 tablet (10 mg total) by mouth daily.    ? methenamine (HIPREX) 1 g tablet Take 1 g by mouth 2 (two) times daily.    ? metoprolol succinate (TOPROL-XL) 25 MG 24 hr tablet TAKE 1 TABLET BY MOUTH EVERY DAY 60 tablet 3  ? omeprazole (PRILOSEC) 20 MG capsule Take 1 capsule by mouth daily.    ? Polyethylene Glycol 400 (BLINK TEARS OP) Place 1 drop into both eyes 2 (two) times daily as needed (dry eyes).     ? PREMARIN vaginal cream Place vaginally.    ? Probiotic Product (PROBIOTIC PO) Take 1 tablet by mouth daily.    ? tamoxifen (NOLVADEX) 20 MG tablet TAKE 1 TABLET(20 MG) BY MOUTH DAILY 90 tablet 3  ? triamcinolone (NASACORT) 55 MCG/ACT AERO nasal inhaler Place 1 spray into the nose daily.    ? ?No  current facility-administered medications for this visit.  ? ? ?PHYSICAL EXAMINATION: ?ECOG PERFORMANCE STATUS: 1 - Symptomatic but completely ambulatory ? ?Vitals:  ? 06/02/21 0910  ?BP: (!) 135/54  ?Pulse: 71  ?Resp: 18  ?Temp: 97.8 ?F (36.6 ?C)  ?SpO2: 100%  ? ?Filed Weights  ? 06/02/21 0910  ?Weight: 184 lb 11.2 oz (83.8 kg)  ? ?  ? ?LABORATORY DATA:  ?I have reviewed the data as listed ? ?  Latest Ref Rng & Units 08/11/2020  ?  9:56 AM 12/19/2018  ? 10:44 AM 11/06/2018  ?  8:15 AM  ?CMP  ?Glucose 70 - 99 mg/dL 89   97   77    ?BUN 6 - 23 mg/dL '19   15   18    '$ ?Creatinine 0.40 - 1.20  mg/dL 0.97   0.98   0.91    ?Sodium 135 - 145 mEq/L 140   139   139    ?Potassium 3.5 - 5.1 mEq/L 4.3   4.4   4.1    ?Chloride 96 - 112 mEq/L 103   103   104    ?CO2 19 - 32 mEq/L '30   28   25    '$ ?Calcium 8.4 - 10.5 mg/dL 9.6   9.8   9.4    ?Total Protein 6.5 - 8.1 g/dL   6.6    ?Total Bilirubin 0.3 - 1.2 mg/dL   0.6    ?Alkaline Phos 38 - 126 U/L   81    ?AST 15 - 41 U/L   17    ?ALT 0 - 44 U/L   13    ? ? ?Lab Results  ?Component Value Date  ? WBC 5.7 12/19/2018  ? HGB 13.8 12/19/2018  ? HCT 42.7 12/19/2018  ? MCV 89.7 12/19/2018  ? PLT 242 12/19/2018  ? NEUTROABS 3.6 11/06/2018  ? ? ?ASSESSMENT & PLAN:  ?Ductal carcinoma in situ (DCIS) of right breast ?12/24/2018: Right lumpectomy by Dr. Barry Dienes: Intermediate grade DCIS, 1 cm, margins clear, ER 95%, PR 60%, Tis NX stage 0 ?  ?Treatment plan: ?1.  Adjuvant radiation therapy 01/21/2019-02/18/2019 ?2.  Adjuvant antiestrogen therapy with tamoxifen x5 years.  03/05/2019 ?  ?Tamoxifen toxicities: ?Moderate to severe hot flashes: These have resolved since she had COVID infection  ?Frequent UTIs: Currently using Premarin and methenamine ?  ?Breast cancer surveillance: ?1.  Breast exam:  06/02/2021: Benign ?2. mammogram 11/10/2020: Benign breast density category D ?  ?CT chest 08/19/2020: 5 mm right middle lobe lung nodule similar (follows with Dr. Kyung Rudd) ?Return to clinic in  1 year for follow-up ? ? ? ?No orders of the defined types were placed in this encounter. ? ?The patient has a good understanding of the overall plan. she agrees with it. she will call with any problems that may develop before the next visit here. ?Total time spent: 30 mins including face to face time and time spent for planning, charting and co-ordination of care ? ? Harriette Ohara, MD ?06/02/21 ? ? ? I Gardiner Coins am scribing for Dr. Lindi Adie ? ?I have reviewed the above documentation for accuracy and completeness, and I agree with the above. ?  ?

## 2021-05-26 ENCOUNTER — Ambulatory Visit: Payer: Medicare Other | Admitting: Hematology and Oncology

## 2021-06-02 ENCOUNTER — Inpatient Hospital Stay: Payer: Medicare Other | Attending: Hematology and Oncology | Admitting: Hematology and Oncology

## 2021-06-02 ENCOUNTER — Other Ambulatory Visit: Payer: Self-pay

## 2021-06-02 DIAGNOSIS — Z923 Personal history of irradiation: Secondary | ICD-10-CM | POA: Insufficient documentation

## 2021-06-02 DIAGNOSIS — R911 Solitary pulmonary nodule: Secondary | ICD-10-CM | POA: Diagnosis not present

## 2021-06-02 DIAGNOSIS — Z8616 Personal history of COVID-19: Secondary | ICD-10-CM | POA: Diagnosis not present

## 2021-06-02 DIAGNOSIS — Z87440 Personal history of urinary (tract) infections: Secondary | ICD-10-CM | POA: Diagnosis not present

## 2021-06-02 DIAGNOSIS — D0511 Intraductal carcinoma in situ of right breast: Secondary | ICD-10-CM | POA: Diagnosis not present

## 2021-06-02 DIAGNOSIS — Z170 Estrogen receptor positive status [ER+]: Secondary | ICD-10-CM | POA: Insufficient documentation

## 2021-06-02 NOTE — Assessment & Plan Note (Addendum)
12/24/2018: Right lumpectomy by Dr. Barry Dienes: Intermediate grade DCIS, 1 cm, margins clear, ER 95%, PR 60%, Tis NX stage 0 ?? ?Treatment plan: ?1.??Adjuvant radiation therapy?01/21/2019-02/18/2019 ?2.??Adjuvant antiestrogen therapy with tamoxifen x5 years. ?03/05/2019 ?? ?Tamoxifen?toxicities: ?Moderate to severe hot flashes: These have resolved since she had COVID infection  ?Frequent UTIs: Currently using Premarin and methenamine ?? ?Breast cancer surveillance: ?1.??Breast exam:  06/02/2021: Benign ?2.?mammogram 11/10/2020: Benign breast density category D ?? ?CT chest 08/19/2020: 5 mm right middle lobe lung nodule similar (follows with Dr. Kyung Rudd) ?Return to clinic in  1 year for follow-up ?

## 2021-06-10 ENCOUNTER — Ambulatory Visit: Payer: Medicare Other | Admitting: Internal Medicine

## 2021-06-10 ENCOUNTER — Encounter: Payer: Self-pay | Admitting: Internal Medicine

## 2021-06-10 VITALS — BP 132/82 | HR 68 | Ht 62.0 in | Wt 184.0 lb

## 2021-06-10 DIAGNOSIS — K6289 Other specified diseases of anus and rectum: Secondary | ICD-10-CM | POA: Diagnosis not present

## 2021-06-10 DIAGNOSIS — K625 Hemorrhage of anus and rectum: Secondary | ICD-10-CM | POA: Diagnosis not present

## 2021-06-10 DIAGNOSIS — R151 Fecal smearing: Secondary | ICD-10-CM | POA: Diagnosis not present

## 2021-06-10 MED ORDER — HYDROCORTISONE (PERIANAL) 2.5 % EX CREA: 1.0000 "application " | TOPICAL_CREAM | Freq: Every evening | CUTANEOUS | 2 refills | Status: AC | PRN

## 2021-06-10 NOTE — Patient Instructions (Signed)
If you are age 69 or older, your body mass index should be between 23-30. Your Body mass index is 33.65 kg/m?Marland Kitchen If this is out of the aforementioned range listed, please consider follow up with your Primary Care Provider. ? ?If you are age 52 or younger, your body mass index should be between 19-25. Your Body mass index is 33.65 kg/m?Marland Kitchen If this is out of the aformentioned range listed, please consider follow up with your Primary Care Provider.  ? ?________________________________________________________ ? ?The [facility] providers would like to encourage you to use [facility] to communicate with providers for non-urgent requests or questions.  Due to long hold times on the telephone, sending your provider a message by [facility] may be a faster and more efficient way to get a response.  Please allow 48 business hours for a response.  Please remember that this is for non-urgent requests.  ?_______________________________________________________ ? ? ?Take 2 tablespoons of Citrucel daily ? ?We have sent the following medications to your pharmacy for you to pick up at your convenience:  Anusol HC cream.  Purchase Preparation H suppositories over the counter.  Apply a pea sized amount of the anusol cream to the suppository and insert nightly as needed. ? ?

## 2021-06-10 NOTE — Progress Notes (Signed)
HISTORY OF PRESENT ILLNESS: ? ?Kara Ramos is a 71 y.o. female with past medical history as listed below who presents today with complaints of fecal leakage and rectal irritation with itching and occasional minor rectal bleeding and discomfort.  Patient was last seen May 2019 when she underwent surveillance colonoscopy for history of multiple adenomatous colon polyps.  She was found to have a sessile serrated polyp.  She is status post sigmoid colon resection remotely for endometriosis.  Follow-up in 5 years recommended.  No significant hemorrhoids noted on that exam. ? ?Patient tells me that over the past year she has noticed fecal smearing daily on a regular basis.  Not full incontinence.  She uses protective pads.  Her bowel habits alternate between constipation and diarrhea.  When her bowels are loose, this problem is worse. ? ?Next, she reports intermittent problems with rectal discomfort, itching, and bleeding.  She attributes this to hemorrhoids.  No other active GI complaints.  She does have a history of GERD for which she takes omeprazole ? ?REVIEW OF SYSTEMS: ? ?All non-GI ROS negative unless otherwise stated in the HPI except for sinus and allergy trouble ? ?Past Medical History:  ?Diagnosis Date  ? Allergy   ? Arthritis   ? Asthma   ? Breast CA ([place])   ? Breast cancer ([place])   ? Cancer [facility]) 1978  ? cervical- had hysterectomy  ? Complication of anesthesia   ? per patient 'one time was aware and awake during surgery and also BP can get really low"  ? GERD (gastroesophageal reflux disease)   ? History of IBS   ? Irregular heart rhythm   ? occasional PAC'S, PVC's  ? Personal history of radiation therapy   ? ? ?Past Surgical History:  ?Procedure Laterality Date  ? ABDOMINAL HYSTERECTOMY    ? both ovaries removed  ? APPENDECTOMY    ? BREAST LUMPECTOMY    ? BREAST LUMPECTOMY WITH RADIOACTIVE SEED LOCALIZATION Right 12/24/2018  ? Procedure: RIGHT BREAST LUMPECTOMY WITH RADIOACTIVE SEED LOCALIZATION;   Surgeon: Stark Klein, MD;  Location: [place];  Service: General;  Laterality: Right;  ? BUNIONECTOMY    ? both feet  ? CATARACT EXTRACTION    ? right  ? COLON SURGERY  1988  ? part of colon removed d/t endometriosis  ? COLONOSCOPY  05/2017  ? One benign polyp  ? CORRECTION HAMMER TOE  03/2017  ? ? ?Social History ?IRETHA KIRLEY  reports that she quit smoking about 46 years ago. Her smoking use included cigarettes. She has a 4.00 pack-year smoking history. She has never used smokeless tobacco. She reports current alcohol use of about 1.0 standard drink per week. She reports that she does not use drugs. ? ?family history includes Brain cancer in her father; Cancer in her paternal grandmother; Colon cancer in her maternal grandmother; Heart disease in her father, mother, and sister; Lung cancer in her brother. ? ?Allergies  ?Allergen Reactions  ? Azithromycin Rash and Other (See Comments)  ?  Pt stated, "Lips turn numb and itching" ?  ? Erythromycin Nausea And Vomiting and Rash  ? Tape   ?  Caused redness and peeling  ? Tessalon [Benzonatate] Itching and Rash  ? ? ?  ? ?PHYSICAL EXAMINATION: ?Vital signs: BP 132/82   Pulse 68   Ht '5\' 2"'$  (1.575 m)   Wt 184 lb (83.5 kg)   SpO2 97%   BMI 33.65 kg/m?   ?Constitutional: generally well-appearing, no acute distress ?  Psychiatric: alert and oriented x3, cooperative ?Eyes: extraocular movements intact, anicteric, conjunctiva pink ?Mouth: oral pharynx moist, no lesions ?Neck: supple no lymphadenopathy ?Cardiovascular: heart regular rate and rhythm, no murmur ?Lungs: clear to auscultation bilaterally ?Abdomen: soft, nontender, nondistended, no obvious ascites, no peritoneal signs, normal bowel sounds, no organomegaly ?Rectal: No external abnormalities.  No discomfort.  No mass.  Brown stool.  Good tone. ?Extremities: no clubbing, cyanosis, or lower extremity edema bilaterally ?Skin: no lesions on visible extremities ?Neuro: No focal deficits.  Cranial nerves  intact ? ?ASSESSMENT: ? ?1.  Fecal smearing as described ?2.  Alternating bowel habits ?3.  Colonoscopy May 2019 as described ?4.  Remote history of sigmoid colon resection for endometriosis ?5.  GERD.  Controlled with PPI ?6.  Rectal discomfort.  No external hemorrhoids.  No internal hemorrhoids on colonoscopy.  Suspect minor fissure.  Doing okay now ? ? ?PLAN: ? ?1.  Citrucel 2 tablespoons daily ?2.  Prescribed Anusol HC suppositories.  Take 1 at night as needed ?3.  Reflux precautions ?4.  Continue PPI ?5.  Surveillance colonoscopy around May 2024 ?6.  Interval follow-up as needed ?A total time of 45 minutes was spent preparing to see the patient, reviewing outside studies and procedures, obtaining comprehensive history, performing medically appropriate physical examination, counseling and educating the patient regarding her above listed issues, ordering medications, and documenting clinical information in the health record ? ? ? ?  ?

## 2021-07-13 ENCOUNTER — Ambulatory Visit: Payer: Medicare Other | Admitting: Allergy

## 2021-07-25 DIAGNOSIS — N3020 Other chronic cystitis without hematuria: Secondary | ICD-10-CM | POA: Diagnosis not present

## 2021-07-25 DIAGNOSIS — N393 Stress incontinence (female) (male): Secondary | ICD-10-CM | POA: Diagnosis not present

## 2021-07-31 NOTE — Progress Notes (Unsigned)
Follow Up Note  RE: Kara Ramos MRN: [id number] DOB: May 07, 1950 Date of Office Visit: 08/01/2021  Referring provider: Vernie Shanks, MD Primary care provider: Vernie Shanks, MD  Chief Complaint: No chief complaint on file.  History of Present Illness: I had the pleasure of seeing Kara Ramos for a follow up visit at the [facility] on 07/31/2021. She is a 71 y.o. female, who is being followed for asthma, allergic rhinitis and adverse food reaction. Her previous allergy office visit was on 07/09/2020 with Dr. Maudie Mercury. Today is a regular follow up visit.  Mild intermittent asthma without complication Past history - Used to have asthma as a teenager now only flares with URIs. 2020 spirometry was normal. Interim history - humid weather and exertion triggers symptoms. Lung nodule found on recent imagining - going to see pulm due to strong personal and family history of cancer.  Today's spirometry showed some mild obstruction - patient states it's the humidity today. May use albuterol rescue inhaler 2 puffs every 4 to 6 hours as needed for shortness of breath, chest tightness, coughing, and wheezing. May use albuterol rescue inhaler 2 puffs 5 to 15 minutes prior to strenuous physical activities. Monitor frequency of use.    Other allergic rhinitis Past history - Perennial rhinitis symptoms since teenage years.  Last skin testing was over 50 years ago which showed multiple positives per patient report. Used to be on allergy immunotherapy as a teenager.  Interim history - PND at times but does not like to use azelastine nasal spray. Claritin works.  Use over the counter antihistamines such as Zyrtec (cetirizine), Claritin (loratadine), Allegra (fexofenadine), or Xyzal (levocetirizine) daily as needed. May take twice a day during allergy flares. May switch antihistamines every few months. May use Nasacort 1 spray 1-2 times a day as needed for nasal congestion.  If  symptoms worsen then consider environmental allergy testing in future.    Adverse food reaction Past history - Diagnosed with IBS and noticing worsening abdominal pains and diarrhea the last few years. Skin testing as a teenager was positive to milk, corn, wheat, grains, coffee, grapefruit, shrimp per patient report. H/o distal sigmoid resection. Follows with GI. 2020 skin testing showed: Borderline positive to wheat, shellfish mix, trout, oat, rye, onion and pineapple.  Interim history - tolerates shellfish, oats; limiting wheat and onion intake. No reactions.  Continue to avoid foods that bother you.  For mild symptoms you can take over the counter antihistamines such as Benadryl and monitor symptoms closely. If symptoms worsen or if you have severe symptoms including breathing issues, throat closure, significant swelling, whole body hives, severe diarrhea and vomiting, lightheadedness then seek immediate medical care.   Return in about 1 year (around 07/09/2021).  Assessment and Plan: [name] is a 71 y.o. female with: No problem-specific Assessment & Plan notes found for this encounter.  No follow-ups on file.  No orders of the defined types were placed in this encounter.  Lab Orders  No laboratory test(s) ordered today    Diagnostics: Spirometry:  Tracings reviewed. Her effort: {Blank single:19197::"Good reproducible efforts.","It was hard to get consistent efforts and there is a question as to whether this reflects a maximal maneuver.","Poor effort, data can not be interpreted."} FVC: ***L FEV1: ***L, ***% predicted FEV1/FVC ratio: ***% Interpretation: {Blank single:19197::"Spirometry consistent with mild obstructive disease","Spirometry consistent with moderate obstructive disease","Spirometry consistent with severe obstructive disease","Spirometry consistent with possible restrictive disease","Spirometry consistent with mixed obstructive and restrictive disease","Spirometry  uninterpretable due to technique","Spirometry consistent with normal pattern","No overt abnormalities noted given today's efforts"}.  Please see scanned spirometry results for details.  Skin Testing: {Blank single:19197::"Select foods","Environmental allergy panel","Environmental allergy panel and select foods","Food allergy panel","None","Deferred due to recent antihistamines use"}. *** Results discussed with patient/family.   Medication List:  Current Outpatient Medications  Medication Sig Dispense Refill   albuterol (PROAIR HFA) 108 (90 Base) MCG/ACT inhaler Inhale 2 puffs into the lungs every 4 (four) hours as needed for wheezing or shortness of breath (coughing). 18 g 1   Ascorbic Acid (VITAMIN C) 500 MG CAPS      Calcium Citrate-Vitamin D (CALCIUM + D PO) Take by mouth.     cetirizine (ZYRTEC) 10 MG tablet Take 10 mg by mouth daily.     Cholecalciferol (VITAMIN D3 PO) Take by mouth.     Cranberry 500 MG TABS Take 500 mg by mouth daily.      desonide (DESOWEN) 0.05 % ointment Apply topically 2 (two) times daily.     hydrocortisone (ANUSOL-HC) 2.5 % rectal cream Place 1 application. rectally at bedtime as needed for hemorrhoids or anal itching. 30 g 2   ibuprofen (ADVIL) 200 MG tablet Take 400 mg by mouth every 6 (six) hours as needed for moderate pain.      methenamine (HIPREX) 1 g tablet Take 1 g by mouth 2 (two) times daily.     metoprolol succinate (TOPROL-XL) 25 MG 24 hr tablet TAKE 1 TABLET BY MOUTH EVERY DAY 60 tablet 3   omeprazole (PRILOSEC) 20 MG capsule Take 1 capsule by mouth daily.     Polyethylene Glycol 400 (BLINK TEARS OP) Place 1 drop into both eyes 2 (two) times daily as needed (dry eyes).      PREMARIN vaginal cream Place vaginally.     Probiotic Product (PROBIOTIC PO) Take 1 tablet by mouth daily.     tamoxifen (NOLVADEX) 20 MG tablet TAKE 1 TABLET(20 MG) BY MOUTH DAILY 90 tablet 3   triamcinolone (NASACORT) 55 MCG/ACT AERO nasal inhaler Place 1 spray into the nose  daily.     No current facility-administered medications for this visit.   Allergies: Allergies  Allergen Reactions   Azithromycin Rash and Other (See Comments)    Pt stated, "Lips turn numb and itching"    Erythromycin Nausea And Vomiting and Rash   Tape     Caused redness and peeling   Tessalon [Benzonatate] Itching and Rash   I reviewed her past medical history, social history, family history, and environmental history and no significant changes have been reported from her previous visit.  Review of Systems  Constitutional:  Negative for appetite change, chills, fever and unexpected weight change.       Hot flashes due to menopause  HENT:  Negative for congestion and rhinorrhea.   Eyes:  Negative for itching.  Respiratory:  Negative for cough, chest tightness, shortness of breath and wheezing.   Cardiovascular:  Negative for chest pain.  Gastrointestinal:  Negative for abdominal pain.  Genitourinary:  Negative for difficulty urinating.  Skin:  Negative for rash.  Allergic/Immunologic: Positive for environmental allergies.  Neurological:  Negative for headaches.    Objective: There were no vitals taken for this visit. There is no height or weight on file to calculate BMI. Physical Exam Vitals and nursing note reviewed.  Constitutional:      Appearance: She is well-developed.  HENT:     Head: Normocephalic and atraumatic.     Right Ear: External  ear normal.     Left Ear: External ear normal.     Nose: Nose normal.  Eyes:     Conjunctiva/sclera: Conjunctivae normal.  Cardiovascular:     Rate and Rhythm: Normal rate and regular rhythm.     Heart sounds: Normal heart sounds. No murmur heard.    No friction rub. No gallop.  Pulmonary:     Effort: Pulmonary effort is normal.     Breath sounds: Normal breath sounds. No wheezing or rales.  Abdominal:     Palpations: Abdomen is soft.  Musculoskeletal:     Cervical back: Neck supple.  Skin:    General: Skin is warm.      Findings: No rash.  Neurological:     Mental Status: She is alert and oriented to person, place, and time.  Psychiatric:        Behavior: Behavior normal.    Previous notes and tests were reviewed. The plan was reviewed with the patient/family, and all questions/concerned were addressed.  It was my pleasure to see Madyn today and participate in her care. Please feel free to contact me with any questions or concerns.  Sincerely,  Rexene Alberts, DO Allergy & Immunology  [facility] office: [place] office: [phone]

## 2021-08-01 ENCOUNTER — Encounter: Payer: Self-pay | Admitting: Allergy

## 2021-08-01 ENCOUNTER — Ambulatory Visit: Payer: Medicare Other | Admitting: Allergy

## 2021-08-01 ENCOUNTER — Other Ambulatory Visit: Payer: Self-pay

## 2021-08-01 VITALS — BP 122/78 | HR 67 | Temp 98.0°F | Resp 16 | Ht 62.0 in | Wt 185.2 lb

## 2021-08-01 DIAGNOSIS — J3089 Other allergic rhinitis: Secondary | ICD-10-CM | POA: Diagnosis not present

## 2021-08-01 DIAGNOSIS — J4520 Mild intermittent asthma, uncomplicated: Secondary | ICD-10-CM

## 2021-08-01 DIAGNOSIS — T781XXD Other adverse food reactions, not elsewhere classified, subsequent encounter: Secondary | ICD-10-CM

## 2021-08-01 NOTE — Assessment & Plan Note (Signed)
Past history - Diagnosed with IBS and noticing worsening abdominal pains and diarrhea the last few years. Skin testing as a teenager was positive to milk, corn, wheat, grains, coffee, grapefruit, shrimp per patient report. H/o distal sigmoid resection. Follows with GI. 2020 skin testing showed: Borderline positive to wheat, shellfish mix, trout, oat, rye, onion and pineapple. Tolerates shellfish. Interim history - no issues, limiting wheat and onion intake.  Follows with GI.  Continue to avoid foods that bother you.   For mild symptoms you can take over the counter antihistamines such as Benadryl and monitor symptoms closely. If symptoms worsen or if you have severe symptoms including breathing issues, throat closure, significant swelling, whole body hives, severe diarrhea and vomiting, lightheadedness then seek immediate medical care.

## 2021-08-01 NOTE — Assessment & Plan Note (Addendum)
Past history - Used to have asthma as a teenager now only flares with URIs. 2020 spirometry was normal. Interim history - saw pulmonology for the lung nodule which is stable.  Had COVID-19 in September 2022 requiring antibiotics and prednisone.  Today's spirometry was normal.  May use albuterol rescue inhaler 2 puffs every 4 to 6 hours as needed for shortness of breath, chest tightness, coughing, and wheezing. May use albuterol rescue inhaler 2 puffs 5 to 15 minutes prior to strenuous physical activities. Monitor frequency of use.   Follow-up with pulmonology as scheduled for the pulmonary nodule.

## 2021-08-01 NOTE — Patient Instructions (Addendum)
Mild intermittent asthma May use albuterol rescue inhaler 2 puffs every 4 to 6 hours as needed for shortness of breath, chest tightness, coughing, and wheezing. May use albuterol rescue inhaler 2 puffs 5 to 15 minutes prior to strenuous physical activities. Monitor frequency of use.  If yo notice worsening breathing issues then let us know.    Allergic rhinitis Use over the counter antihistamines such as Zyrtec (cetirizine), Claritin (loratadine), Allegra (fexofenadine), or Xyzal (levocetirizine) daily as needed. May take twice a day during allergy flares. May switch antihistamines every few months. May use Nasacort 1 spray 1-2 times a day as needed for nasal congestion.  If symptoms worsen then consider environmental allergy testing in future.   Food  Past skin testing showed: Borderline positive to wheat, shellfish mix, trout, oat, rye, onion and pineapple.  Continue to avoid foods that bother you.  For mild symptoms you can take over the counter antihistamines such as Benadryl and monitor symptoms closely. If symptoms worsen or if you have severe symptoms including breathing issues, throat closure, significant swelling, whole body hives, severe diarrhea and vomiting, lightheadedness then seek immediate medical care.  Follow up in 12 months or sooner if needed.   Reducing Pollen Exposure Pollen seasons: trees (spring), grass (summer) and ragweed/weeds (fall). Keep windows closed in your home and car to lower pollen exposure.  Install air conditioning in the bedroom and throughout the house if possible.  Avoid going out in dry windy days - especially early morning. Pollen counts are highest between 5 - 10 AM and on dry, hot and windy days.  Save outside activities for late afternoon or after a heavy rain, when pollen levels are lower.  Avoid mowing of grass if you have grass pollen allergy. Be aware that pollen can also be transported indoors on people and pets.  Dry your clothes in an  automatic dryer rather than hanging them outside where they might collect pollen.  Rinse hair and eyes before bedtime.

## 2021-08-01 NOTE — Assessment & Plan Note (Signed)
Past history - Perennial rhinitis symptoms since teenage years.  Last skin testing was over 50 years ago which showed multiple positives per patient report. Used to be on allergy immunotherapy as a teenager.  Does not like azelastine. Interim history - controlled.  Use over the counter antihistamines such as Zyrtec (cetirizine), Claritin (loratadine), Allegra (fexofenadine), or Xyzal (levocetirizine) daily as needed. May take twice a day during allergy flares. May switch antihistamines every few months.  May use Nasacort 1 spray 1-2 times a day as needed for nasal congestion.   If symptoms worsen then consider environmental allergy testing in future.

## 2021-08-30 ENCOUNTER — Other Ambulatory Visit: Payer: Self-pay

## 2021-08-30 MED ORDER — ALBUTEROL SULFATE HFA 108 (90 BASE) MCG/ACT IN AERS: 2.0000 | INHALATION_SPRAY | RESPIRATORY_TRACT | 5 refills | Status: DC | PRN

## 2021-09-09 DIAGNOSIS — D224 Melanocytic nevi of scalp and neck: Secondary | ICD-10-CM | POA: Diagnosis not present

## 2021-09-09 DIAGNOSIS — L578 Other skin changes due to chronic exposure to nonionizing radiation: Secondary | ICD-10-CM | POA: Diagnosis not present

## 2021-09-09 DIAGNOSIS — D1801 Hemangioma of skin and subcutaneous tissue: Secondary | ICD-10-CM | POA: Diagnosis not present

## 2021-09-09 DIAGNOSIS — D225 Melanocytic nevi of trunk: Secondary | ICD-10-CM | POA: Diagnosis not present

## 2021-09-27 ENCOUNTER — Other Ambulatory Visit: Payer: Self-pay | Admitting: Hematology and Oncology

## 2021-09-27 DIAGNOSIS — Z1231 Encounter for screening mammogram for malignant neoplasm of breast: Secondary | ICD-10-CM

## 2021-10-13 DIAGNOSIS — Z0000 Encounter for general adult medical examination without abnormal findings: Secondary | ICD-10-CM | POA: Diagnosis not present

## 2021-10-13 DIAGNOSIS — Z1389 Encounter for screening for other disorder: Secondary | ICD-10-CM | POA: Diagnosis not present

## 2021-11-01 DIAGNOSIS — I493 Ventricular premature depolarization: Secondary | ICD-10-CM | POA: Diagnosis not present

## 2021-11-01 DIAGNOSIS — Z780 Asymptomatic menopausal state: Secondary | ICD-10-CM | POA: Diagnosis not present

## 2021-11-01 DIAGNOSIS — Z23 Encounter for immunization: Secondary | ICD-10-CM | POA: Diagnosis not present

## 2021-11-01 DIAGNOSIS — C50919 Malignant neoplasm of unspecified site of unspecified female breast: Secondary | ICD-10-CM | POA: Diagnosis not present

## 2021-11-01 DIAGNOSIS — E7800 Pure hypercholesterolemia, unspecified: Secondary | ICD-10-CM | POA: Diagnosis not present

## 2021-11-01 DIAGNOSIS — E559 Vitamin D deficiency, unspecified: Secondary | ICD-10-CM | POA: Diagnosis not present

## 2021-11-11 ENCOUNTER — Ambulatory Visit
Admission: RE | Admit: 2021-11-11 | Discharge: 2021-11-11 | Disposition: A | Discharge status: Home / Self Care | Payer: Medicare Other | Source: Ambulatory Visit | Attending: Hematology and Oncology | Admitting: Hematology and Oncology

## 2021-11-11 DIAGNOSIS — Z1231 Encounter for screening mammogram for malignant neoplasm of breast: Secondary | ICD-10-CM

## 2021-11-14 ENCOUNTER — Other Ambulatory Visit: Payer: Self-pay | Admitting: Hematology and Oncology

## 2021-11-14 DIAGNOSIS — R928 Other abnormal and inconclusive findings on diagnostic imaging of breast: Secondary | ICD-10-CM

## 2021-11-28 ENCOUNTER — Ambulatory Visit
Admission: RE | Admit: 2021-11-28 | Discharge: 2021-11-28 | Disposition: A | Discharge status: Home / Self Care | Payer: Medicare Other | Source: Ambulatory Visit | Attending: Hematology and Oncology | Admitting: Hematology and Oncology

## 2021-11-28 ENCOUNTER — Ambulatory Visit: Admission: RE | Admit: 2021-11-28 | Payer: Medicare Other | Source: Ambulatory Visit

## 2021-11-28 DIAGNOSIS — R92332 Mammographic heterogeneous density, left breast: Secondary | ICD-10-CM | POA: Diagnosis not present

## 2021-11-28 DIAGNOSIS — R928 Other abnormal and inconclusive findings on diagnostic imaging of breast: Secondary | ICD-10-CM

## 2021-12-15 ENCOUNTER — Other Ambulatory Visit: Payer: Self-pay | Admitting: Hematology and Oncology

## 2021-12-15 DIAGNOSIS — Z1231 Encounter for screening mammogram for malignant neoplasm of breast: Secondary | ICD-10-CM

## 2022-01-09 DIAGNOSIS — H35371 Puckering of macula, right eye: Secondary | ICD-10-CM | POA: Diagnosis not present

## 2022-01-09 DIAGNOSIS — H43393 Other vitreous opacities, bilateral: Secondary | ICD-10-CM | POA: Diagnosis not present

## 2022-01-09 DIAGNOSIS — H524 Presbyopia: Secondary | ICD-10-CM | POA: Diagnosis not present

## 2022-01-09 DIAGNOSIS — H353111 Nonexudative age-related macular degeneration, right eye, early dry stage: Secondary | ICD-10-CM | POA: Diagnosis not present

## 2022-01-09 DIAGNOSIS — H04123 Dry eye syndrome of bilateral lacrimal glands: Secondary | ICD-10-CM | POA: Diagnosis not present

## 2022-01-23 ENCOUNTER — Other Ambulatory Visit: Payer: Self-pay | Admitting: Hematology and Oncology

## 2022-02-17 ENCOUNTER — Ambulatory Visit
Admission: RE | Admit: 2022-02-17 | Discharge: 2022-02-17 | Disposition: A | Discharge status: Home / Self Care | Payer: Medicare Other | Source: Ambulatory Visit | Attending: Emergency Medicine | Admitting: Emergency Medicine

## 2022-02-17 DIAGNOSIS — R911 Solitary pulmonary nodule: Secondary | ICD-10-CM

## 2022-02-28 ENCOUNTER — Ambulatory Visit: Payer: Medicare Other | Admitting: Emergency Medicine

## 2022-02-28 ENCOUNTER — Encounter: Payer: Self-pay | Admitting: Emergency Medicine

## 2022-02-28 VITALS — BP 124/70 | HR 91 | Temp 98.3°F | Ht 62.0 in | Wt 186.0 lb

## 2022-02-28 DIAGNOSIS — J3089 Other allergic rhinitis: Secondary | ICD-10-CM

## 2022-02-28 DIAGNOSIS — R911 Solitary pulmonary nodule: Secondary | ICD-10-CM

## 2022-02-28 NOTE — Assessment & Plan Note (Signed)
Agree with continuing either Zyrtec or loratadine once daily. Agree with using steroid nasal spray during the fall months

## 2022-02-28 NOTE — Addendum Note (Signed)
Addended by: Lorretta Harp on: 02/28/2022 01:49 PM   Modules accepted: Orders

## 2022-02-28 NOTE — Patient Instructions (Signed)
We reviewed your CT scan of the chest today. We will plan to repeat your CT scan of the chest in January 2025 Agree with continuing either Zyrtec or loratadine once daily. Agree with using steroid nasal spray during the fall months Please follow Dr. Lamonte Sakai in January 2025 to review your CT.  Call sooner if you have any new respiratory symptoms.

## 2022-02-28 NOTE — Progress Notes (Signed)
   Subjective:    Patient ID: Kara Ramos, female    DOB: Jun 28, 1950, 72 y.o.   MRN: [id number]  HPI  ROV 02/28/22 --follow-up visit for 72 year old woman with a minimal tobacco history, history of breast cancer and cervical cancer, asthma, treated latent TB (childhood), allergic rhinitis, IBS.  We have been following an abnormal CT scan of the chest in the setting of these issues as well as a strong family history of lung cancer.  He had been following serial imaging.  Most recent CT chest 02/17/2022 as below. She has been doing well, did have a URI since last time that precipitated a long stint of cough. She does cough daily, has allergies. She is on zyrtec, has breakthrough sx.   CT chest 02/17/2022 reviewed by me, shows no mediastinal or hilar adenopathy, some mild apical pleural thickening.  A 5 mm right middle lobe nodule is stable (unchanged from 08/2020).  There is some mild bronchiectasis, less prominent than on her prior imaging.  Also a few scattered tiny semisolid and groundglass sub-5 mm nodules bilaterally.  No new dominant nodules.   Review of Systems As per HPI     Objective:   Physical Exam Vitals:   02/28/22 1326  BP: 124/70  Pulse: 91  Temp: 98.3 F (36.8 C)  TempSrc: Oral  SpO2: 95%  Weight: 186 lb (84.4 kg)  Height: '5\' 2"'$  (1.575 m)   Gen: Pleasant, well-nourished, in no distress,  normal affect  ENT: No lesions,  mouth clear,  oropharynx clear, no postnasal drip  Neck: No JVD, no stridor  Lungs: No use of accessory muscles, no crackles or wheezing on normal respiration, no wheeze on forced expiration  Cardiovascular: RRR, heart sounds normal, no murmur or gallops, no peripheral edema  Musculoskeletal: No deformities, no cyanosis or clubbing  Neuro: alert, awake, non focal  Skin: Warm, no lesions or rash      Assessment & Plan:   Pulmonary nodule Small right middle lobe and right lower lobe pulmonary nodules, stable going back to at least 02/2021,  probably back to 08/2020.  Plan to repeat her CT chest in 02/2023.  If stable at that time then we may not need to continue to do dedicated surveillance.  She does have some very subtle bronchiectatic change but no overt symptoms of bronchiectasis or opportunistic infection.  We will correlate this with her respiratory symptoms  We reviewed your CT scan of the chest today. We will plan to repeat your CT scan of the chest in January 2025 Please follow Dr. Lamonte Sakai in January 2025 to review your CT.  Call sooner if you have any new respiratory symptoms.  Other allergic rhinitis Agree with continuing either Zyrtec or loratadine once daily. Agree with using steroid nasal spray during the fall months    Baltazar Apo, MD, PhD 02/28/2022, 1:42 PM [facility] [phone] or if no answer before 7:00PM call [phone] For any issues after 7:00PM please call eLink [phone]

## 2022-02-28 NOTE — Assessment & Plan Note (Signed)
Small right middle lobe and right lower lobe pulmonary nodules, stable going back to at least 02/2021, probably back to 08/2020.  Plan to repeat her CT chest in 02/2023.  If stable at that time then we may not need to continue to do dedicated surveillance.  She does have some very subtle bronchiectatic change but no overt symptoms of bronchiectasis or opportunistic infection.  We will correlate this with her respiratory symptoms  We reviewed your CT scan of the chest today. We will plan to repeat your CT scan of the chest in January 2025 Please follow Dr. Lamonte Sakai in January 2025 to review your CT.  Call sooner if you have any new respiratory symptoms.

## 2022-05-02 DIAGNOSIS — E7800 Pure hypercholesterolemia, unspecified: Secondary | ICD-10-CM | POA: Diagnosis not present

## 2022-05-02 DIAGNOSIS — U071 COVID-19: Secondary | ICD-10-CM | POA: Diagnosis not present

## 2022-05-02 DIAGNOSIS — Z780 Asymptomatic menopausal state: Secondary | ICD-10-CM | POA: Diagnosis not present

## 2022-05-02 DIAGNOSIS — I493 Ventricular premature depolarization: Secondary | ICD-10-CM | POA: Diagnosis not present

## 2022-05-02 DIAGNOSIS — M85851 Other specified disorders of bone density and structure, right thigh: Secondary | ICD-10-CM | POA: Diagnosis not present

## 2022-05-02 DIAGNOSIS — E559 Vitamin D deficiency, unspecified: Secondary | ICD-10-CM | POA: Diagnosis not present

## 2022-05-03 ENCOUNTER — Other Ambulatory Visit: Payer: Self-pay | Admitting: Family Medicine

## 2022-05-03 DIAGNOSIS — M85851 Other specified disorders of bone density and structure, right thigh: Secondary | ICD-10-CM

## 2022-05-05 DIAGNOSIS — R21 Rash and other nonspecific skin eruption: Secondary | ICD-10-CM | POA: Diagnosis not present

## 2022-05-05 DIAGNOSIS — T887XXA Unspecified adverse effect of drug or medicament, initial encounter: Secondary | ICD-10-CM | POA: Diagnosis not present

## 2022-05-05 DIAGNOSIS — U071 COVID-19: Secondary | ICD-10-CM | POA: Diagnosis not present

## 2022-05-05 DIAGNOSIS — R220 Localized swelling, mass and lump, head: Secondary | ICD-10-CM | POA: Diagnosis not present

## 2022-05-09 ENCOUNTER — Encounter: Payer: Self-pay | Admitting: Internal Medicine

## 2022-06-02 NOTE — Progress Notes (Signed)
Patient Care Team: Jarrett Soho, PA-C as PCP - General (Family Medicine) Serena Croissant, MD as Consulting Physician (Hematology and Oncology) Lequita Puffer, MD as Consulting Physician (Radiation Oncology) Almond Lint, MD as Consulting Physician (General Surgery)  DIAGNOSIS: No diagnosis found.  SUMMARY OF ONCOLOGIC HISTORY: Oncology History  Ductal carcinoma in situ (DCIS) of right breast  11/01/2018 Initial Diagnosis   Routine screening mammogram detected calcifications in the LIQ spanning 1.6cm. 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    12/24/2018 Surgery   Right lumpectomy Donell Beers) [phone]): DCIS, intermediate grade, 1.0cm, clear margins. No regional lymph nodes were examined.   12/24/2018 Cancer Staging   Staging form: Breast, AJCC 8th Edition - Pathologic stage from 12/24/2018: Stage 0 (pTis (DCIS), pN0, cM0, ER+, PR+)    01/20/2019 - 02/18/2019 Radiation Therapy   The patient initially received a dose of 42.56 Gy in 16 fractions to the breast using whole-breast tangent fields. This was delivered using a 3-D conformal technique. The patient then received a boost to the seroma. This delivered an additional 8 Gy in 52fractions using a 3 field photon technique due to the depth of the seroma. The total dose was 50.56 Gy.   02/2019 - 02/2024 Anti-estrogen oral therapy   Tamoxifen     CHIEF COMPLIANT:  Follow-up of right breast DCIS on tamoxifen    INTERVAL HISTORY: Kara Ramos is a 72 y.o. with above-mentioned history of right breast DCIS currently on antiestrogen therapy with tamoxifen. She presents to the clinic today for a follow-up.    ALLERGIES:  is allergic to azithromycin, erythromycin, tape, and tessalon [benzonatate].  MEDICATIONS:  Current Outpatient Medications  Medication Sig Dispense Refill   albuterol (VENTOLIN HFA) 108 (90 Base) MCG/ACT inhaler Inhale 2 puffs into the lungs every 4 (four) hours as needed for  wheezing or shortness of breath. 1 each 5   Ascorbic Acid (VITAMIN C) 500 MG CAPS      Calcium Citrate-Vitamin D (CALCIUM + D PO) Take by mouth.     cetirizine (ZYRTEC) 10 MG tablet Take 10 mg by mouth daily.     Cholecalciferol (VITAMIN D3 PO) Take by mouth.     Cranberry 500 MG TABS Take 500 mg by mouth daily.      desonide (DESOWEN) 0.05 % ointment Apply topically 2 (two) times daily.     hydrocortisone (ANUSOL-HC) 2.5 % rectal cream Place 1 application. rectally at bedtime as needed for hemorrhoids or anal itching. 30 g 2   ibuprofen (ADVIL) 200 MG tablet Take 400 mg by mouth every 6 (six) hours as needed for moderate pain.      methenamine (HIPREX) 1 g tablet Take 1 g by mouth 2 (two) times daily.     metoprolol succinate (TOPROL-XL) 25 MG 24 hr tablet TAKE 1 TABLET BY MOUTH EVERY DAY 60 tablet 3   omeprazole (PRILOSEC) 20 MG capsule Take 1 capsule by mouth daily.     Polyethylene Glycol 400 (BLINK TEARS OP) Place 1 drop into both eyes 2 (two) times daily as needed (dry eyes).      PREMARIN vaginal cream Place vaginally.     Probiotic Product (PROBIOTIC PO) Take 1 tablet by mouth daily.     tamoxifen (NOLVADEX) 20 MG tablet TAKE 1 TABLET(20 MG) BY MOUTH DAILY 90 tablet 3   triamcinolone (NASACORT) 55 MCG/ACT AERO nasal inhaler Place 1 spray into the nose daily.     No current facility-administered medications for this visit.  PHYSICAL EXAMINATION: ECOG PERFORMANCE STATUS: {CHL ONC ECOG PS:[id number]}  There were no vitals filed for this visit. There were no vitals filed for this visit.  BREAST:*** No palpable masses or nodules in either right or left breasts. No palpable axillary supraclavicular or infraclavicular adenopathy no breast tenderness or nipple discharge. (exam performed in the presence of a chaperone)  LABORATORY DATA:  I have reviewed the data as listed    Latest Ref Rng & Units 08/11/2020    9:56 AM 12/19/2018   10:44 AM 11/06/2018    8:15 AM  CMP  Glucose 70 -  99 mg/dL 89  97  77   BUN 6 - 23 mg/dL Creatinine 0.40 - 1.20 mg/dL 2.95  6.21  3.08   Sodium 135 - 145 mEq/L 140  139  139   Potassium 3.5 - 5.1 mEq/L 4.3  4.4  4.1   Chloride 96 - 112 mEq/L 103  103  104   CO2 19 - 32 mEq/L Calcium 8.4 - 10.5 mg/dL 9.6  9.8  9.4   Total Protein 6.5 - 8.1 g/dL   6.6   Total Bilirubin 0.3 - 1.2 mg/dL   0.6   Alkaline Phos 38 - 126 U/L   81   AST 15 - 41 U/L   17   ALT 0 - 44 U/L   13     Lab Results  Component Value Date   WBC 5.7 12/19/2018   HGB 13.8 12/19/2018   HCT 42.7 12/19/2018   MCV 89.7 12/19/2018   PLT 242 12/19/2018   NEUTROABS 3.6 11/06/2018    ASSESSMENT & PLAN:  No problem-specific Assessment & Plan notes found for this encounter.    No orders of the defined types were placed in this encounter.  The patient has a good understanding of the overall plan. she agrees with it. she will call with any problems that may develop before the next visit here. Total time spent: 30 mins including face to face time and time spent for planning, charting and co-ordination of care   Sherlyn Lick, CMA 06/02/22    I Janan Ridge am acting as a Neurosurgeon for [employer]  ***

## 2022-06-05 ENCOUNTER — Inpatient Hospital Stay: Payer: Medicare Other | Attending: Hematology and Oncology | Admitting: Hematology and Oncology

## 2022-06-05 VITALS — BP 120/58 | HR 76 | Temp 97.7°F | Resp 18 | Ht 62.0 in | Wt 186.9 lb

## 2022-06-05 DIAGNOSIS — Z1239 Encounter for other screening for malignant neoplasm of breast: Secondary | ICD-10-CM | POA: Diagnosis not present

## 2022-06-05 DIAGNOSIS — Z170 Estrogen receptor positive status [ER+]: Secondary | ICD-10-CM | POA: Insufficient documentation

## 2022-06-05 DIAGNOSIS — Z09 Encounter for follow-up examination after completed treatment for conditions other than malignant neoplasm: Secondary | ICD-10-CM | POA: Diagnosis not present

## 2022-06-05 DIAGNOSIS — Z87440 Personal history of urinary (tract) infections: Secondary | ICD-10-CM | POA: Insufficient documentation

## 2022-06-05 DIAGNOSIS — Z79810 Long term (current) use of selective estrogen receptor modulators (SERMs): Secondary | ICD-10-CM | POA: Diagnosis not present

## 2022-06-05 DIAGNOSIS — D0511 Intraductal carcinoma in situ of right breast: Secondary | ICD-10-CM

## 2022-06-05 DIAGNOSIS — Z853 Personal history of malignant neoplasm of breast: Secondary | ICD-10-CM | POA: Diagnosis not present

## 2022-06-05 NOTE — Assessment & Plan Note (Addendum)
12/24/2018: Right lumpectomy by Dr. Donell Beers: Intermediate grade DCIS, 1 cm, margins clear, ER 95%, PR 60%, Tis NX stage 0   Treatment plan: 1.  Adjuvant radiation therapy 01/21/2019-02/18/2019 2.  Adjuvant antiestrogen therapy with tamoxifen x5 years. Started 03/05/2019   Tamoxifen toxicities: Moderate hot flashes: Stable Frequent UTIs: Currently using Premarin.  Much improved   Breast cancer surveillance: 1.  Breast exam: 06/05/2022: Benign 2. mammogram 11/10/2020: Benign breast density category D   CT chest 02/19/2022: Multiple small nodules in the lungs stable (follows with Dr. Corliss Blacker)  Return to clinic in  1 year for follow-up

## 2022-06-06 ENCOUNTER — Telehealth: Payer: Self-pay | Admitting: Hematology and Oncology

## 2022-06-06 NOTE — Telephone Encounter (Signed)
Scheduled appointment per 4/22 los. Patient is aware of the made appointment. 

## 2022-06-12 ENCOUNTER — Encounter: Payer: Self-pay | Admitting: Internal Medicine

## 2022-06-12 ENCOUNTER — Ambulatory Visit (AMBULATORY_SURGERY_CENTER): Payer: Medicare Other | Admitting: *Deleted

## 2022-06-12 VITALS — Ht 62.0 in | Wt 180.0 lb

## 2022-06-12 DIAGNOSIS — Z86010 Personal history of colonic polyps: Secondary | ICD-10-CM

## 2022-06-12 DIAGNOSIS — Z85038 Personal history of other malignant neoplasm of large intestine: Secondary | ICD-10-CM

## 2022-06-12 MED ORDER — PEG 3350-KCL-NA BICARB-NACL 420 G PO SOLR: 4000.0000 mL | Freq: Once | ORAL | 0 refills | Status: AC

## 2022-06-12 NOTE — Progress Notes (Signed)
Pt's name and DOB verified at the beginning of the pre-visit.  Pt denies any difficulty with ambulating.sitting laying down or rolling side to side No egg or soy allergy known to patient  No issues known to pt with past sedation with any surgeries or procedures Pt denies having issues being intubated Pt has no issues moving head neck or swallowing No FH of Malignant Hyperthermia Pt is not on diet pills Pt is not on home 02  Pt is not on blood thinners  Pt has frequent issues with constipation RN instructed pt to use Miralax per bottles instructions a week before prep days. Pt states they will Pt is not on dialysis Pt denies any upcoming cardiac testing Pt encouraged to use to use Singlecare or Goodrx to reduce cost  Patient's chart reviewed by Cathlyn Parsons CNRA prior to pre-visit and patient appropriate for the [facility].  Pre-visit completed and red dot placed by patient's name on their procedure day (on provider's schedule).  . Visit by phone Pt states weight is 32.92 Instructed pt why it is important to and  to call if they have any changes in health or new medications. Directed them to the # given and on instructions.   Pt states they will.  Instructions reviewed with pt and pt states understanding. Instructed to review again prior to procedure. Pt states they will.  Instructions sent by mail with coupon and by my chart

## 2022-07-06 ENCOUNTER — Encounter: Payer: Self-pay | Admitting: Internal Medicine

## 2022-07-06 ENCOUNTER — Ambulatory Visit (AMBULATORY_SURGERY_CENTER): Payer: Medicare Other | Admitting: Internal Medicine

## 2022-07-06 VITALS — BP 124/62 | HR 74 | Temp 98.0°F | Resp 12 | Ht 62.0 in | Wt 180.0 lb

## 2022-07-06 DIAGNOSIS — D122 Benign neoplasm of ascending colon: Secondary | ICD-10-CM

## 2022-07-06 DIAGNOSIS — Z86010 Personal history of colonic polyps: Secondary | ICD-10-CM

## 2022-07-06 DIAGNOSIS — Z09 Encounter for follow-up examination after completed treatment for conditions other than malignant neoplasm: Secondary | ICD-10-CM | POA: Diagnosis not present

## 2022-07-06 DIAGNOSIS — K635 Polyp of colon: Secondary | ICD-10-CM | POA: Diagnosis not present

## 2022-07-06 DIAGNOSIS — D125 Benign neoplasm of sigmoid colon: Secondary | ICD-10-CM

## 2022-07-06 MED ORDER — SODIUM CHLORIDE 0.9 % IV SOLN: 500.0000 mL | Freq: Once | INTRAVENOUS | Status: DC

## 2022-07-06 NOTE — Progress Notes (Signed)
HISTORY OF PRESENT ILLNESS:  Kara Ramos is a 72 y.o. female with a history of multiple adenomatous colon polyps.  Now for surveillance  REVIEW OF SYSTEMS:  All non-GI ROS negative except for  Past Medical History:  Diagnosis Date   Allergy    Arthritis    Asthma    Breast CA (HCC)    Breast cancer (HCC)    Cancer (HCC) 1978   cervical- had hysterectomy   Cataract    Removed both eyes   Complication of anesthesia    per patient 'one time was aware and awake during surgery and also BP can get really low"   GERD (gastroesophageal reflux disease)    History of IBS    Irregular heart rhythm    occasional PAC'S, PVC's   Osteopenia    Personal history of radiation therapy     Past Surgical History:  Procedure Laterality Date   ABDOMINAL HYSTERECTOMY     both ovaries removed   APPENDECTOMY     BREAST LUMPECTOMY     BREAST LUMPECTOMY WITH RADIOACTIVE SEED LOCALIZATION Right 12/24/2018   Procedure: RIGHT BREAST LUMPECTOMY WITH RADIOACTIVE SEED LOCALIZATION;  Surgeon: Almond Lint, MD;  Location: [facility] OR;  Service: General;  Laterality: Right;   BUNIONECTOMY     both feet   CATARACT EXTRACTION     right   COLON SURGERY  1988   part of colon removed d/t endometriosis   COLONOSCOPY  05/2017   One benign polyp   CORRECTION HAMMER TOE  03/2017    Social History Kara Ramos  reports that she quit smoking about 47 years ago. Her smoking use included cigarettes. She has a 4.00 pack-year smoking history. She has never used smokeless tobacco. She reports current alcohol use of about 1.0 standard drink of alcohol per week. She reports that she does not use drugs.  family history includes Brain cancer in her father; Cancer in her paternal grandmother; Colon cancer in her maternal grandmother; Heart disease in her father, mother, and sister; Lung cancer in her brother.  Allergies  Allergen Reactions   Azithromycin Rash and Other (See Comments)    Pt stated, "Lips turn  numb and itching"    Molnupiravir Hives, Itching and Swelling   Doxycycline Hives and Rash   Erythromycin Nausea And Vomiting and Rash   Tape     Caused redness and peeling   Tessalon [Benzonatate] Itching and Rash       PHYSICAL EXAMINATION: Vital signs: BP 137/76   Pulse 75   Temp 98 F (36.7 C)   Resp 10   Ht 5\' 2"  (1.575 m)   Wt 180 lb (81.6 kg)   SpO2 100%   BMI 32.92 kg/m  General: Well-developed, well-nourished, no acute distress HEENT: Sclerae are anicteric, conjunctiva pink. Oral mucosa intact Lungs: Clear Heart: Regular Abdomen: soft, nontender, nondistended, no obvious ascites, no peritoneal signs, normal bowel sounds. No organomegaly. Extremities: No edema Psychiatric: alert and oriented x3. Cooperative     ASSESSMENT:  Personal history of multiple adenomatous polyps   PLAN:   Surveillance colonoscopy

## 2022-07-06 NOTE — Progress Notes (Signed)
Called to room to assist during endoscopic procedure.  Patient ID and intended procedure confirmed with present staff. Received instructions for my participation in the procedure from the performing physician.  

## 2022-07-06 NOTE — Patient Instructions (Signed)
Resume previous diet and medications. Awaiting pathology results. Repeat Colonoscopy date to be determined based on pathology results.  (Likely in 5 years for surveillance.)  Handouts provided on Colon polyps and Diverticulosis  YOU HAD AN ENDOSCOPIC PROCEDURE TODAY AT THE [facility]:   Refer to the procedure report that was given to you for any specific questions about what was found during the examination.  If the procedure report does not answer your questions, please call your gastroenterologist to clarify.  If you requested that your care partner not be given the details of your procedure findings, then the procedure report has been included in a sealed envelope for you to review at your convenience later.  YOU SHOULD EXPECT: Some feelings of bloating in the abdomen. Passage of more gas than usual.  Walking can help get rid of the air that was put into your GI tract during the procedure and reduce the bloating. If you had a lower endoscopy (such as a colonoscopy or flexible sigmoidoscopy) you may notice spotting of blood in your stool or on the toilet paper. If you underwent a bowel prep for your procedure, you may not have a normal bowel movement for a few days.  Please Note:  You might notice some irritation and congestion in your nose or some drainage.  This is from the oxygen used during your procedure.  There is no need for concern and it should clear up in a day or so.  SYMPTOMS TO REPORT IMMEDIATELY:  Following lower endoscopy (colonoscopy or flexible sigmoidoscopy):  Excessive amounts of blood in the stool  Significant tenderness or worsening of abdominal pains  Swelling of the abdomen that is new, acute  Fever of 100F or higher  For urgent or emergent issues, a gastroenterologist can be reached at any hour by calling [phone]. Do not use MyChart messaging for urgent concerns.    DIET:  We do recommend a small meal at first, but then you may proceed to your  regular diet.  Drink plenty of fluids but you should avoid alcoholic beverages for 24 hours.  ACTIVITY:  You should plan to take it easy for the rest of today and you should NOT DRIVE or use heavy machinery until tomorrow (because of the sedation medicines used during the test).    FOLLOW UP: Our staff will call the number listed on your records the next business day following your procedure.  We will call around 7:15- 8:00 am to check on you and address any questions or concerns that you may have regarding the information given to you following your procedure. If we do not reach you, we will leave a message.     If any biopsies were taken you will be contacted by phone or by letter within the next 1-3 weeks.  Please call us at [phone] if you have not heard about the biopsies in 3 weeks.    SIGNATURES/CONFIDENTIALITY: You and/or your care partner have signed paperwork which will be entered into your electronic medical record.  These signatures attest to the fact that that the information above on your After Visit Summary has been reviewed and is understood.  Full responsibility of the confidentiality of this discharge information lies with you and/or your care-partner.

## 2022-07-06 NOTE — Op Note (Signed)
[facility] Patient Name: Kara Ramos Procedure Date: 07/06/2022 10:57 AM MRN: [id number] Endoscopist: Wilhemina Bonito. Marina Goodell , MD, [id number] Age: 72 Referring MD:  Date of Birth: 01/11/1951 Gender: Female Account #: [id number] Procedure:                Colonoscopy with cold snare polypectomy x 2 Indications:              High risk colon cancer surveillance: Personal                            history of multiple (3 or more) adenomas. Multiple                            prior examinations. Most recently 2019. Also remote                            history of partial colonic resection for benign                            disease Medicines:                Monitored Anesthesia Care Procedure:                Pre-Anesthesia Assessment:                           - Prior to the procedure, a History and Physical                            was performed, and patient medications and                            allergies were reviewed. The patient's tolerance of                            previous anesthesia was also reviewed. The risks                            and benefits of the procedure and the sedation                            options and risks were discussed with the patient.                            All questions were answered, and informed consent                            was obtained. Prior Anticoagulants: The patient has                            taken no anticoagulant or antiplatelet agents. ASA                            Grade Assessment: II - A patient with mild systemic  disease. After reviewing the risks and benefits,                            the patient was deemed in satisfactory condition to                            undergo the procedure.                           After obtaining informed consent, the colonoscope                            was passed under direct vision. Throughout the                            procedure, the  patient's blood pressure, pulse, and                            oxygen saturations were monitored continuously. The                            CF [id number] #[id number] was introduced through the anus                            and advanced to the the cecum, identified by                            appendiceal orifice and ileocecal valve. The                            ileocecal valve, appendiceal orifice, and rectum                            were photographed. The quality of the bowel                            preparation was excellent. The colonoscopy was                            performed without difficulty. The patient tolerated                            the procedure well. The bowel preparation used was                            SUPREP via split dose instruction. Scope In: 11:14:49 AM Scope Out: 11:29:11 AM Scope Withdrawal Time: 0 hours 11 minutes 40 seconds  Total Procedure Duration: 0 hours 14 minutes 22 seconds  Findings:                 Two polyps were found in the sigmoid colon and                            ascending colon. The polyps were 3 to 4 mm in  size.                            These polyps were removed with a cold snare.                            Resection and retrieval were complete.                           A few diverticula were found in the sigmoid colon.                           Unremarkable rectosigmoid anastomosis noted.                           The exam was otherwise without abnormality on                            direct and retroflexion views. Complications:            No immediate complications. Estimated blood loss:                            None. Estimated Blood Loss:     Estimated blood loss: none. Impression:               - Two 3 to 4 mm polyps in the sigmoid colon and in                            the ascending colon, removed with a cold snare.                            Resected and retrieved.                           - Diverticulosis in the  sigmoid colon.                           - The examination was otherwise normal on direct                            and retroflexion views. Recommendation:           - Repeat colonoscopy in 5 years for surveillance.                           - Patient has a contact number available for                            emergencies. The signs and symptoms of potential                            delayed complications were discussed with the                            patient. Return to normal activities tomorrow.  Written discharge instructions were provided to the                            patient.                           - Resume previous diet.                           - Continue present medications.                           - Await pathology results. Wilhemina Bonito. Marina Goodell, MD 07/06/2022 11:36:07 AM This report has been signed electronically.

## 2022-07-06 NOTE — Progress Notes (Signed)
Report to PACU, RN, vss, BBS= Clear.  

## 2022-07-06 NOTE — Progress Notes (Signed)
Pt's states no medical or surgical changes since previsit or office visit. 

## 2022-07-07 ENCOUNTER — Telehealth: Payer: Self-pay | Admitting: *Deleted

## 2022-07-07 NOTE — Telephone Encounter (Signed)
  Follow up Call-     07/06/2022   10:16 AM  Call back number  Post procedure Call Back phone  # [phone]  Permission to leave phone message Yes     Patient questions:  Do you have a fever, pain , or abdominal swelling? No. Pain Score  0 *  Have you tolerated food without any problems? Yes.    Have you been able to return to your normal activities? Yes.    Do you have any questions about your discharge instructions: Diet   No. Medications  No. Follow up visit  No.  Do you have questions or concerns about your Care? No.  Actions: * If pain score is 4 or above: No action needed, pain <4.

## 2022-07-11 ENCOUNTER — Encounter: Payer: Self-pay | Admitting: Internal Medicine

## 2022-08-01 DIAGNOSIS — N393 Stress incontinence (female) (male): Secondary | ICD-10-CM | POA: Diagnosis not present

## 2022-08-01 DIAGNOSIS — N390 Urinary tract infection, site not specified: Secondary | ICD-10-CM | POA: Diagnosis not present

## 2022-08-03 DIAGNOSIS — K080 Exfoliation of teeth due to systemic causes: Secondary | ICD-10-CM | POA: Diagnosis not present

## 2022-08-10 DIAGNOSIS — R21 Rash and other nonspecific skin eruption: Secondary | ICD-10-CM | POA: Diagnosis not present

## 2022-09-15 DIAGNOSIS — R197 Diarrhea, unspecified: Secondary | ICD-10-CM | POA: Diagnosis not present

## 2022-10-06 ENCOUNTER — Other Ambulatory Visit: Payer: Self-pay | Admitting: Medical Genetics

## 2022-10-06 DIAGNOSIS — Z006 Encounter for examination for normal comparison and control in clinical research program: Secondary | ICD-10-CM

## 2022-10-09 ENCOUNTER — Other Ambulatory Visit: Payer: Medicare Other

## 2022-10-09 DIAGNOSIS — K080 Exfoliation of teeth due to systemic causes: Secondary | ICD-10-CM | POA: Diagnosis not present

## 2022-10-09 DIAGNOSIS — Z1379 Encounter for other screening for genetic and chromosomal anomalies: Secondary | ICD-10-CM

## 2022-10-09 DIAGNOSIS — Z006 Encounter for examination for normal comparison and control in clinical research program: Secondary | ICD-10-CM

## 2022-10-17 DIAGNOSIS — Z23 Encounter for immunization: Secondary | ICD-10-CM | POA: Diagnosis not present

## 2022-10-17 DIAGNOSIS — Z0000 Encounter for general adult medical examination without abnormal findings: Secondary | ICD-10-CM | POA: Diagnosis not present

## 2022-10-25 DIAGNOSIS — E559 Vitamin D deficiency, unspecified: Secondary | ICD-10-CM | POA: Diagnosis not present

## 2022-10-25 DIAGNOSIS — R197 Diarrhea, unspecified: Secondary | ICD-10-CM | POA: Diagnosis not present

## 2022-10-25 DIAGNOSIS — I493 Ventricular premature depolarization: Secondary | ICD-10-CM | POA: Diagnosis not present

## 2022-10-25 DIAGNOSIS — K080 Exfoliation of teeth due to systemic causes: Secondary | ICD-10-CM | POA: Diagnosis not present

## 2022-10-25 DIAGNOSIS — M85851 Other specified disorders of bone density and structure, right thigh: Secondary | ICD-10-CM | POA: Diagnosis not present

## 2022-10-25 DIAGNOSIS — K760 Fatty (change of) liver, not elsewhere classified: Secondary | ICD-10-CM | POA: Diagnosis not present

## 2022-10-25 DIAGNOSIS — E7800 Pure hypercholesterolemia, unspecified: Secondary | ICD-10-CM | POA: Diagnosis not present

## 2022-10-26 DIAGNOSIS — L309 Dermatitis, unspecified: Secondary | ICD-10-CM | POA: Diagnosis not present

## 2022-10-26 DIAGNOSIS — L570 Actinic keratosis: Secondary | ICD-10-CM | POA: Diagnosis not present

## 2022-10-26 DIAGNOSIS — D225 Melanocytic nevi of trunk: Secondary | ICD-10-CM | POA: Diagnosis not present

## 2022-10-26 DIAGNOSIS — L578 Other skin changes due to chronic exposure to nonionizing radiation: Secondary | ICD-10-CM | POA: Diagnosis not present

## 2022-11-01 DIAGNOSIS — N3946 Mixed incontinence: Secondary | ICD-10-CM | POA: Diagnosis not present

## 2022-11-06 DIAGNOSIS — N393 Stress incontinence (female) (male): Secondary | ICD-10-CM | POA: Diagnosis not present

## 2022-11-08 ENCOUNTER — Ambulatory Visit
Admission: RE | Admit: 2022-11-08 | Discharge: 2022-11-08 | Disposition: A | Discharge status: Home / Self Care | Payer: Medicare Other | Source: Ambulatory Visit | Attending: Family Medicine | Admitting: Family Medicine

## 2022-11-08 DIAGNOSIS — Z853 Personal history of malignant neoplasm of breast: Secondary | ICD-10-CM | POA: Diagnosis not present

## 2022-11-08 DIAGNOSIS — M85851 Other specified disorders of bone density and structure, right thigh: Secondary | ICD-10-CM

## 2022-11-08 DIAGNOSIS — N958 Other specified menopausal and perimenopausal disorders: Secondary | ICD-10-CM | POA: Diagnosis not present

## 2022-11-08 DIAGNOSIS — Z90722 Acquired absence of ovaries, bilateral: Secondary | ICD-10-CM | POA: Diagnosis not present

## 2022-11-08 DIAGNOSIS — E349 Endocrine disorder, unspecified: Secondary | ICD-10-CM | POA: Diagnosis not present

## 2022-11-13 ENCOUNTER — Encounter: Payer: Medicare Other | Admitting: Physical Therapy

## 2022-11-27 NOTE — Therapy (Signed)
OUTPATIENT PHYSICAL THERAPY FEMALE PELVIC EVALUATION   Patient Name: Kara Ramos MRN: [id number] DOB:1950/03/31, 72 y.o., female Today's Date: 11/28/2022  END OF SESSION:  PT End of Session - 11/28/22 2011     Visit Number 1    Date for PT Re-Evaluation 12/29/22    Authorization Time Period 10/15-12/11/2022    PT Start Time 1445    PT Stop Time 1530    PT Time Calculation (min) 45 min    Activity Tolerance Patient tolerated treatment well    Behavior During Therapy [facility] for tasks assessed/performed             Past Medical History:  Diagnosis Date   Allergy    Arthritis    Asthma    Breast CA (HCC)    Breast cancer (HCC)    Cancer (HCC) 1978   cervical- had hysterectomy   Cataract    Removed both eyes   Complication of anesthesia    per patient 'one time was aware and awake during surgery and also BP can get really low"   GERD (gastroesophageal reflux disease)    History of IBS    Irregular heart rhythm    occasional PAC'S, PVC's   Osteopenia    Personal history of radiation therapy    Past Surgical History:  Procedure Laterality Date   ABDOMINAL HYSTERECTOMY     both ovaries removed   APPENDECTOMY     BREAST LUMPECTOMY     BREAST LUMPECTOMY WITH RADIOACTIVE SEED LOCALIZATION Right 12/24/2018   Procedure: RIGHT BREAST LUMPECTOMY WITH RADIOACTIVE SEED LOCALIZATION;  Surgeon: Almond Lint, MD;  Location: [facility] OR;  Service: General;  Laterality: Right;   BUNIONECTOMY     both feet   CATARACT EXTRACTION     right   COLON SURGERY  1988   part of colon removed d/t endometriosis   COLONOSCOPY  05/2017   One benign polyp   CORRECTION HAMMER TOE  03/2017   Patient Active Problem List   Diagnosis Date Noted   Pulmonary nodule 08/11/2020   Essential hypertension 03/23/2019   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   Adverse food reaction 10/23/2018   Mild intermittent asthma without complication 10/23/2018   Other allergic rhinitis  10/23/2018   PVC (premature ventricular contraction) 03/21/2018   Decreased estrogen level 04/04/2017   Menopausal problem 04/04/2017   Menopausal syndrome 04/04/2017   Osteopenia 04/04/2017   Vaginal irritation 04/04/2017   Irritable bowel syndrome 02/14/1968    PCP: Jarrett Soho, PA-C Ref Provider (PCP)  REFERRING PROVIDER: Jarrett Soho, PA-C Ref Provider (PCP)  REFERRING DIAG: N39.3 (ICD-10-CM) - Stress incontinence (female) (female)   THERAPY DIAG:  Other lack of coordination  Other muscle spasm  Rationale for Evaluation and Treatment: Rehabilitation  ONSET DATE: worsening since about a year ago 10/ 2023  SUBJECTIVE:  SUBJECTIVE STATEMENT: Pt reports that it's more difficult to expel urine. Urinating is difficult, does not have steady stream. About 6 months- year. Still has SUI- 8- 10 years. Does more Kegel type exercises, has had PFPT 3-4 years ago- that helped. Has had some bowel issues, has had to strain. Constipation.  Would like to get exercises to help her relax pelvic floor, has a sling scheduled November 22.      Fluid intake: Yes:      PAIN:  Are you having pain? No  PRECAUTIONS: None  RED FLAGS: None   WEIGHT BEARING RESTRICTIONS: No  FALLS:  Has patient fallen in last 6 months? No  LIVING ENVIRONMENT: Lives with: lives alone Lives in: House/apartment Stairs: No Has following equipment at home: None  OCCUPATION: retired, Agricultural consultant work  PLOF: Independent  PATIENT GOALS: not to have to strain  PERTINENT HISTORY:   Sexual abuse: No  BOWEL MOVEMENT: Pain with bowel movement: No Type of bowel movement:Type (Bristol Stool Scale) alternates 1-7 Fully empty rectum: No Leakage: Yes: feels like she could Pads: No Fiber supplement: Yes:     URINATION: Pain with urination: No Fully empty bladder: Yes: most of the time Stream: Weak Urgency: No Frequency: no Leakage: Coughing, Sneezing, and Laughing, going to the bathroom, steps when carryng something heavy Pads: yes  INTERCOURSE: Pain with intercourse:  not active  PREGNANCY: Vaginal deliveries 0 PROLAPSE: Cystocele     OBJECTIVE:  Note: Objective measures were completed at Evaluation unless otherwise noted.  COGNITION: Overall cognitive status: Within functional limits for tasks assessed     SENSATION: Light touch: Appears intact Proprioception: Appears intact  MUSCLE LENGTH: Hamstrings: Right tight Left tight Thomas test: Right tight ; Left tight     POSTURE:  upper chest breathing, abdominal gripping, difficulty with relaxing abdomen, protracted head, stiff throughout, decreased movement with walking  PELVIC ALIGNMENT:  LUMBARAROM/PROM:  A/PROM A/PROM  eval  Flexion   Extension   Right lateral flexion   Left lateral flexion   Right rotation   Left rotation    (Blank rows = not tested)  LOWER EXTREMITY ROM: grossly screened d/t time - limited  throughout  Passive ROM Right eval Left eval  Hip flexion    Hip extension    Hip abduction    Hip adduction    Hip internal rotation    Hip external rotation    Knee flexion    Knee extension    Ankle dorsiflexion    Ankle plantarflexion    Ankle inversion    Ankle eversion     (Blank rows = not tested)  LOWER EXTREMITY MMT:  MMT Right eval Left eval  Hip flexion    Hip extension    Hip abduction    Hip adduction    Hip internal rotation    Hip external rotation    Knee flexion    Knee extension    Ankle dorsiflexion    Ankle plantarflexion    Ankle inversion    Ankle eversion     PALPATION:   General  within functional limitations                 External Perineal Exam within functional limitations                              Internal Pelvic Floor decent strength  LA, some difficulty with bulging and awareness, anterior wall laxity, tenderness in first  layer  Patient confirms identification and approves PT to assess internal pelvic floor and treatment Yes  PELVIC MMT:   MMT eval  Vaginal 3/5  Internal Anal Sphincter   External Anal Sphincter   Puborectalis   Diastasis Recti Significant abdominal scars present  (Blank rows = not tested)        TONE: Anterior wall laxity  PROLAPSE: cystocele  TODAY'S TREATMENT:                                                                                                                              DATE: 11/28/22   EVAL see above   PATIENT EDUCATION/ ther acts:  Education details: pt educated on abdominal scar massage and cupping, diaphragmatic breathing to lengthen PF, constipation management- using a squatty potty Person educated: Patient Education method: Explanation and Handouts Education comprehension: verbalized understanding and needs further education  HOME EXERCISE PROGRAM:  K32MG CX5  ASSESSMENT:  CLINICAL IMPRESSION: Patient is a 72 y.o. F who was seen today for physical therapy evaluation and treatment for difficulty with urination, stress urinary incontinence and PT  before sling surgery. She presents with abdominal gripping, difficulty with pelvic floor lengthening and stiffness throughout her back and pelvis. Some tenderness abdominal scar and restrictions throughout abdomen and pelvic floor. She did well today with education and internal muscle assessment and treatment and exercises, She will benefit from PT to improve urination and reduce SUI and improve QOL  OBJECTIVE IMPAIRMENTS: decreased coordination, difficulty walking, decreased ROM, decreased strength, hypomobility, increased fascial restrictions, increased muscle spasms, impaired flexibility, impaired sensation, and pain.   ACTIVITY LIMITATIONS: continence  PARTICIPATION LIMITATIONS: community activity  PERSONAL FACTORS:  Fitness and Time since onset of injury/illness/exacerbation are also affecting patient's functional outcome.   REHAB POTENTIAL: Good  CLINICAL DECISION MAKING: Evolving/moderate complexity  EVALUATION COMPLEXITY: Moderate   GOALS: Goals reviewed with patient? Yes  SHORT TERM GOALS: Target date: 12/26/2022    Pt will be independent with HEP.   Baseline: Goal status: INITIAL  2.  Pt will be independent with use of squatty potty, relaxed toileting mechanics, and improved bowel movement techniques in order to increase ease of bowel movements and complete evacuation.   Baseline:  Goal status: INITIAL  LONG TERM GOALS: Target date: 01/23/2023    Pt will be independent and compliant with HEP and perform all exercises correctly   Baseline:  Goal status: INITIAL  2.  Patient will use 0 pads a day and will present with no urinary leakage to Improve quality of life  Baseline:  Goal status: INITIAL  3.  Patient will report maximum 3/10 pain to improve mobility and quality of life  Baseline:  Goal status: INITIAL    PLAN:  PT FREQUENCY: 1-2x/week  PT DURATION:  8 sessions  PLANNED INTERVENTIONS: 97110-Therapeutic exercises, 97530- Therapeutic activity, 97112- Neuromuscular re-education, 97535- Self Care, 95638- Manual therapy, Dry Needling, Joint mobilization, Joint manipulation, Spinal manipulation, Spinal mobilization, and Scar mobilization  PLAN FOR NEXT SESSION: back and hip mobility exercises   Edgar Corrigan, PT 11/28/2022, 8:14 PM

## 2022-11-28 ENCOUNTER — Encounter: Payer: Self-pay | Admitting: Physical Therapy

## 2022-11-28 ENCOUNTER — Ambulatory Visit: Payer: Medicare Other | Attending: Urology | Admitting: Physical Therapy

## 2022-11-28 ENCOUNTER — Other Ambulatory Visit: Payer: Self-pay

## 2022-11-28 DIAGNOSIS — M62838 Other muscle spasm: Secondary | ICD-10-CM | POA: Insufficient documentation

## 2022-11-28 DIAGNOSIS — N393 Stress incontinence (female) (male): Secondary | ICD-10-CM | POA: Diagnosis not present

## 2022-11-28 DIAGNOSIS — R278 Other lack of coordination: Secondary | ICD-10-CM | POA: Diagnosis not present

## 2022-11-30 ENCOUNTER — Ambulatory Visit
Admission: RE | Admit: 2022-11-30 | Discharge: 2022-11-30 | Disposition: A | Discharge status: Home / Self Care | Payer: Medicare Other | Source: Ambulatory Visit | Attending: Hematology and Oncology | Admitting: Hematology and Oncology

## 2022-11-30 DIAGNOSIS — Z1231 Encounter for screening mammogram for malignant neoplasm of breast: Secondary | ICD-10-CM

## 2022-12-04 ENCOUNTER — Other Ambulatory Visit: Payer: Self-pay | Admitting: Hematology and Oncology

## 2022-12-04 DIAGNOSIS — R928 Other abnormal and inconclusive findings on diagnostic imaging of breast: Secondary | ICD-10-CM

## 2022-12-05 ENCOUNTER — Ambulatory Visit: Payer: Medicare Other | Admitting: Physical Therapy

## 2022-12-05 DIAGNOSIS — R278 Other lack of coordination: Secondary | ICD-10-CM

## 2022-12-05 DIAGNOSIS — M62838 Other muscle spasm: Secondary | ICD-10-CM

## 2022-12-05 NOTE — Therapy (Signed)
OUTPATIENT PHYSICAL THERAPY FEMALE PELVIC EVALUATION   Patient Name: Kara Ramos MRN: [id number] DOB:1950-12-26, 72 y.o., female Today's Date: 12/05/2022  END OF SESSION:  PT End of Session - 12/05/22 1530     Visit Number 2    Date for PT Re-Evaluation 01/23/23    Authorization Type [employer]    Authorization Time Period 10/15-12/11/2022    PT Start Time 1445    PT Stop Time 1530    PT Time Calculation (min) 45 min    Activity Tolerance Patient tolerated treatment well    Behavior During Therapy WFL for tasks assessed/performed              Past Medical History:  Diagnosis Date   Allergy    Arthritis    Asthma    Breast CA (HCC)    Breast cancer (HCC)    Cancer (HCC) 1978   cervical- had hysterectomy   Cataract    Removed both eyes   Complication of anesthesia    per patient 'one time was aware and awake during surgery and also BP can get really low"   GERD (gastroesophageal reflux disease)    History of IBS    Irregular heart rhythm    occasional PAC'S, PVC's   Osteopenia    Personal history of radiation therapy    Past Surgical History:  Procedure Laterality Date   ABDOMINAL HYSTERECTOMY     both ovaries removed   APPENDECTOMY     BREAST LUMPECTOMY     BREAST LUMPECTOMY WITH RADIOACTIVE SEED LOCALIZATION Right 12/24/2018   Procedure: RIGHT BREAST LUMPECTOMY WITH RADIOACTIVE SEED LOCALIZATION;  Surgeon: Almond Lint, MD;  Location: [facility] OR;  Service: General;  Laterality: Right;   BUNIONECTOMY     both feet   CATARACT EXTRACTION     right   COLON SURGERY  1988   part of colon removed d/t endometriosis   COLONOSCOPY  05/2017   One benign polyp   CORRECTION HAMMER TOE  03/2017   Patient Active Problem List   Diagnosis Date Noted   Pulmonary nodule 08/11/2020   Essential hypertension 03/23/2019   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   Adverse food reaction 10/23/2018   Mild intermittent asthma without complication  10/23/2018   Other allergic rhinitis 10/23/2018   PVC (premature ventricular contraction) 03/21/2018   Decreased estrogen level 04/04/2017   Menopausal problem 04/04/2017   Menopausal syndrome 04/04/2017   Osteopenia 04/04/2017   Vaginal irritation 04/04/2017   Irritable bowel syndrome 02/14/1968    PCP: Jarrett Soho, PA-C Ref Provider (PCP)  REFERRING PROVIDER: Jarrett Soho, PA-C Ref Provider (PCP)  REFERRING DIAG: N39.3 (ICD-10-CM) - Stress incontinence (female) (female)   THERAPY DIAG:  Other lack of coordination  Other muscle spasm  Rationale for Evaluation and Treatment: Rehabilitation  ONSET DATE: worsening since about a year ago 10/ 2023  SUBJECTIVE:  SUBJECTIVE STATEMENT: Pt reports that it's more difficult to expel urine. Urinating is difficult, does not have steady stream. About 6 months- year. Still has SUI- 8- 10 years. Does more Kegel type exercises, has had PFPT 3-4 years ago- that helped. Has had some bowel issues, has had to strain. Constipation.  Would like to get exercises to help her relax pelvic floor, has a sling scheduled November 22.      Fluid intake: Yes:      PAIN:  Are you having pain? No  PRECAUTIONS: None  RED FLAGS: None   WEIGHT BEARING RESTRICTIONS: No  FALLS:  Has patient fallen in last 6 months? No  LIVING ENVIRONMENT: Lives with: lives alone Lives in: House/apartment Stairs: No Has following equipment at home: None  OCCUPATION: retired, Agricultural consultant work  PLOF: Independent  PATIENT GOALS: not to have to strain  PERTINENT HISTORY:   Sexual abuse: No  BOWEL MOVEMENT: Pain with bowel movement: No Type of bowel movement:Type (Bristol Stool Scale) alternates 1-7 Fully empty rectum: No Leakage: Yes: feels like she could Pads:  No Fiber supplement: Yes:    URINATION: Pain with urination: No Fully empty bladder: Yes: most of the time Stream: Weak Urgency: No Frequency: no Leakage: Coughing, Sneezing, and Laughing, going to the bathroom, steps when carryng something heavy Pads: yes  INTERCOURSE: Pain with intercourse:  not active  PREGNANCY: Vaginal deliveries 0 PROLAPSE: Cystocele     OBJECTIVE:  Note: Objective measures were completed at Evaluation unless otherwise noted.  COGNITION: Overall cognitive status: Within functional limits for tasks assessed     SENSATION: Light touch: Appears intact Proprioception: Appears intact  MUSCLE LENGTH: Hamstrings: Right tight Left tight Thomas test: Right tight ; Left tight     POSTURE:  upper chest breathing, abdominal gripping, difficulty with relaxing abdomen, protracted head, stiff throughout, decreased movement with walking  PELVIC ALIGNMENT: NA  LUMBARAROM/PROM:  A/PROM A/PROM  eval  Flexion   Extension   Right lateral flexion   Left lateral flexion   Right rotation   Left rotation    (Blank rows = not tested)  LOWER EXTREMITY ROM: grossly screened d/t time - limited  throughout  Passive ROM Right eval Left eval  Hip flexion    Hip extension    Hip abduction    Hip adduction    Hip internal rotation    Hip external rotation    Knee flexion    Knee extension    Ankle dorsiflexion    Ankle plantarflexion    Ankle inversion    Ankle eversion     (Blank rows = not tested)  LOWER EXTREMITY MMT:  MMT Right eval Left eval  Hip flexion    Hip extension    Hip abduction    Hip adduction    Hip internal rotation    Hip external rotation    Knee flexion    Knee extension    Ankle dorsiflexion    Ankle plantarflexion    Ankle inversion    Ankle eversion     PALPATION:   General  within functional limitations                 External Perineal Exam within functional limitations                              Internal  Pelvic Floor decent strength LA, some difficulty with bulging and awareness, anterior wall laxity, tenderness in  first layer  Patient confirms identification and approves PT to assess internal pelvic floor and treatment Yes  PELVIC MMT:   MMT eval  Vaginal 3/5  Internal Anal Sphincter   External Anal Sphincter   Puborectalis   Diastasis Recti Significant abdominal scars present  (Blank rows = not tested)        TONE: Anterior wall laxity  PROLAPSE: cystocele  TODAY'S TREATMENT:                                                                                                                              DATE: 12/05/22   Manual: abdominal scar release  Neuromuscular re-education: DB in hooklying Child's pose HS stretch, butterfly Yoga ball seated DB Figure 8, hip circles      EVAL see above   PATIENT EDUCATION/ ther acts:  Education details: pt educated on abdominal scar massage and cupping, diaphragmatic breathing to lengthen PF, constipation management- using a squatty potty Person educated: Patient Education method: Explanation and Handouts Education comprehension: verbalized understanding and needs further education  HOME EXERCISE PROGRAM:  [id number]  ASSESSMENT:  CLINICAL IMPRESSION:  Pt did well with her exercises, discussed getting squatty potty to help with emptying. Pt scheduled to have a sling surgery in Novemeber. Pt with tight abdominal scar- did well with manual therapy and ordered cups for home scar massage.  Patient is a 72 y.o. F who was seen today for physical therapy evaluation and treatment for difficulty with urination, stress urinary incontinence and PT  before sling surgery. She presents with abdominal gripping, difficulty with pelvic floor lengthening and stiffness throughout her back and pelvis. Some tenderness abdominal scar and restrictions throughout abdomen and pelvic floor. She did well today with education and internal muscle assessment and  treatment and exercises, She will benefit from PT to improve urination and reduce SUI and improve QOL  OBJECTIVE IMPAIRMENTS: decreased coordination, difficulty walking, decreased ROM, decreased strength, hypomobility, increased fascial restrictions, increased muscle spasms, impaired flexibility, impaired sensation, and pain.   ACTIVITY LIMITATIONS: continence  PARTICIPATION LIMITATIONS: community activity  PERSONAL FACTORS: Fitness and Time since onset of injury/illness/exacerbation are also affecting patient's functional outcome.   REHAB POTENTIAL: Good  CLINICAL DECISION MAKING: Evolving/moderate complexity  EVALUATION COMPLEXITY: Moderate   GOALS: Goals reviewed with patient? Yes  SHORT TERM GOALS: Target date: 12/26/2022    Pt will be independent with HEP.   Baseline: Goal status: INITIAL  2.  Pt will be independent with use of squatty potty, relaxed toileting mechanics, and improved bowel movement techniques in order to increase ease of bowel movements and complete evacuation.   Baseline:  Goal status: INITIAL  LONG TERM GOALS: Target date: 01/23/2023    Pt will be independent and compliant with HEP and perform all exercises correctly   Baseline:  Goal status: INITIAL  2.  Patient will use 0 pads a day and will present with no urinary leakage to Improve quality of life  Baseline:  Goal status: INITIAL  3.  Patient will report maximum 3/10 pain to improve mobility and quality of life  Baseline:  Goal status: INITIAL    PLAN:  PT FREQUENCY: 1-2x/week  PT DURATION:  8 sessions  PLANNED INTERVENTIONS: 97110-Therapeutic exercises, 97530- Therapeutic activity, 97112- Neuromuscular re-education, 97535- Self Care, [id number]- Manual therapy, Dry Needling, Joint mobilization, Joint manipulation, Spinal manipulation, Spinal mobilization, and Scar mobilization  PLAN FOR NEXT SESSION: back and hip mobility exercises  Emelie Newsom, PT 12/05/22 3:32 PM

## 2022-12-11 ENCOUNTER — Ambulatory Visit: Payer: Medicare Other | Admitting: Physical Therapy

## 2022-12-11 DIAGNOSIS — M62838 Other muscle spasm: Secondary | ICD-10-CM

## 2022-12-11 DIAGNOSIS — N393 Stress incontinence (female) (male): Secondary | ICD-10-CM | POA: Diagnosis not present

## 2022-12-11 DIAGNOSIS — R278 Other lack of coordination: Secondary | ICD-10-CM

## 2022-12-11 NOTE — Therapy (Signed)
OUTPATIENT PHYSICAL THERAPY FEMALE PELVIC EVALUATION   Patient Name: Kara Ramos MRN: [id number] DOB:[birth year], 72 y.o., female Today's Date: 12/11/2022  END OF SESSION:  PT End of Session - 12/11/22 1353     Visit Number 3    Date for PT Re-Evaluation 01/23/23    Authorization Time Period 10/15-12/11/2022    Progress Note Due on Visit 10    PT Start Time 1235    PT Stop Time 1315    PT Time Calculation (min) 40 min    Activity Tolerance Patient tolerated treatment well    Behavior During Therapy WFL for tasks assessed/performed               Past Medical History:  Diagnosis Date   Allergy    Arthritis    Asthma    Breast CA (HCC)    Breast cancer (HCC)    Cancer (HCC) 1978   cervical- had hysterectomy   Cataract    Removed both eyes   Complication of anesthesia    per patient 'one time was aware and awake during surgery and also BP can get really low"   GERD (gastroesophageal reflux disease)    History of IBS    Irregular heart rhythm    occasional PAC'S, PVC's   Osteopenia    Personal history of radiation therapy    Past Surgical History:  Procedure Laterality Date   ABDOMINAL HYSTERECTOMY     both ovaries removed   APPENDECTOMY     BREAST LUMPECTOMY     BREAST LUMPECTOMY WITH RADIOACTIVE SEED LOCALIZATION Right 12/24/2018   Procedure: RIGHT BREAST LUMPECTOMY WITH RADIOACTIVE SEED LOCALIZATION;  Surgeon: Almond Lint, MD;  Location: [facility] OR;  Service: General;  Laterality: Right;   BUNIONECTOMY     both feet   CATARACT EXTRACTION     right   COLON SURGERY  1988   part of colon removed d/t endometriosis   COLONOSCOPY  05/2017   One benign polyp   CORRECTION HAMMER TOE  03/2017   Patient Active Problem List   Diagnosis Date Noted   Pulmonary nodule 08/11/2020   Essential hypertension 03/23/2019   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   Adverse food reaction 10/23/2018   Mild intermittent asthma without complication  10/23/2018   Other allergic rhinitis 10/23/2018   PVC (premature ventricular contraction) 03/21/2018   Decreased estrogen level 04/04/2017   Menopausal problem 04/04/2017   Menopausal syndrome 04/04/2017   Osteopenia 04/04/2017   Vaginal irritation 04/04/2017   Irritable bowel syndrome 02/14/1968    PCP: Jarrett Soho, PA-C Ref Provider (PCP)  REFERRING PROVIDER: Jarrett Soho, PA-C Ref Provider (PCP)  REFERRING DIAG: N39.3 (ICD-10-CM) - Stress incontinence (female) (female)   THERAPY DIAG:  Other lack of coordination  Other muscle spasm  Rationale for Evaluation and Treatment: Rehabilitation  ONSET DATE: worsening since about a year ago 10/ 2023  SUBJECTIVE:  SUBJECTIVE STATEMENT: Pt reports that she is doing better, doing some stretches, breathing properly. Urinating is easier. Urine stream is better.  [place] issues are better, less straining. Sling scheduled November 22. Reports that she is tight, always has been. Has got her squatty potty and her cups for scar cupping therapy. Has not used cups yet.    Pt reports that it's more difficult to expel urine. Urinating is difficult, does not have steady stream. About 6 months- year. Still has SUI- 8- 10 years. Does more Kegel type exercises, has had PFPT 3-4 years ago- that helped. Has had some bowel issues, has had to strain. Constipation.  Would like to get exercises to help her relax pelvic floor, has a sling scheduled November 22. Has to get mammogram - maybe needle bipsy, might need to push it Had a sneezing fit this morning and did not leak.      Fluid intake: Yes:      PAIN:  Are you having pain? No  PRECAUTIONS: None  RED FLAGS: None   WEIGHT BEARING RESTRICTIONS: No  FALLS:  Has patient fallen in last 6 months?  No  LIVING ENVIRONMENT: Lives with: lives alone Lives in: House/apartment Stairs: No Has following equipment at home: None  OCCUPATION: retired, Agricultural consultant work  PLOF: Independent  PATIENT GOALS: not to have to strain  PERTINENT HISTORY:   Sexual abuse: No  BOWEL MOVEMENT: Pain with bowel movement: No Type of bowel movement:Type (Bristol Stool Scale) alternates 1-7 Fully empty rectum: No Leakage: Yes: feels like she could Pads: No Fiber supplement: Yes:    URINATION: Pain with urination: No Fully empty bladder: Yes: most of the time Stream: Weak Urgency: No Frequency: no Leakage: Coughing, Sneezing, and Laughing, going to the bathroom, steps when carryng something heavy Pads: yes  INTERCOURSE: Pain with intercourse:  not active  PREGNANCY: Vaginal deliveries 0 PROLAPSE: Cystocele     OBJECTIVE:  Note: Objective measures were completed at Evaluation unless otherwise noted.  COGNITION: Overall cognitive status: Within functional limits for tasks assessed     SENSATION: Light touch: Appears intact Proprioception: Appears intact  MUSCLE LENGTH: Hamstrings: Right tight Left tight Thomas test: Right tight ; Left tight     POSTURE:  upper chest breathing, abdominal gripping, difficulty with relaxing abdomen, protracted head, stiff throughout, decreased movement with walking  PELVIC ALIGNMENT: NA  LUMBARAROM/PROM: tightness throughout   LOWER EXTREMITY ROM: grossly screened d/t time - limited  throughout  LOWER EXTREMITY MMT:  PALPATION:   General  within functional limitations                 External Perineal Exam within functional limitations                              Internal Pelvic Floor decent strength LA, some difficulty with bulging and awareness, anterior wall laxity, tenderness in first layer  Patient confirms identification and approves PT to assess internal pelvic floor and treatment Yes  PELVIC MMT:   MMT eval  Vaginal 3/5   Internal Anal Sphincter   External Anal Sphincter   Puborectalis   Diastasis Recti Significant abdominal scars present  (Blank rows = not tested)        TONE: Anterior wall laxity  PROLAPSE: cystocele  TODAY'S TREATMENT:  DATE: 12/11/22     Neuromuscular re-education: bridging with TRANSVERSE ABDOMINIS BREATH, rowing with TRANSVERSE ABDOMINIS BREATH with minisquat   Therapeutic exercise: open books, child's pose, DB, piriformis stretch, butterfly, DB, hip adduction with ball with TRANSVERSE ABDOMINIS BREATH            EVAL see above   PATIENT EDUCATION/ ther acts:  Education details: pt educated on abdominal scar massage and cupping, diaphragmatic breathing to lengthen PF, constipation management- using a squatty potty Person educated: Patient Education method: Explanation and Handouts Education comprehension: verbalized understanding and needs further education  HOME EXERCISE PROGRAM:  [id number]   ASSESSMENT:  CLINICAL IMPRESSION: Pt progressing well, happy with her progress, SUI better. Added new exercises for core and PF activation to her HEP. Pt is able to have improved coordination and activation of core and pelvic floor with breath.    Pt did well with her exercises, discussed getting squatty potty to help with emptying. Pt scheduled to have a sling surgery in Novemeber. Pt with tight abdominal scar- did well with manual therapy and ordered cups for home scar massage.  Patient is a 72 y.o. F who was seen today for physical therapy evaluation and treatment for difficulty with urination, stress urinary incontinence and PT  before sling surgery. She presents with abdominal gripping, difficulty with pelvic floor lengthening and stiffness throughout her back and pelvis. Some tenderness abdominal scar and restrictions throughout abdomen and  pelvic floor. She did well today with education and internal muscle assessment and treatment and exercises, She will benefit from PT to improve urination and reduce SUI and improve QOL  OBJECTIVE IMPAIRMENTS: decreased coordination, difficulty walking, decreased ROM, decreased strength, hypomobility, increased fascial restrictions, increased muscle spasms, impaired flexibility, impaired sensation, and pain.   ACTIVITY LIMITATIONS: continence  PARTICIPATION LIMITATIONS: community activity  PERSONAL FACTORS: Fitness and Time since onset of injury/illness/exacerbation are also affecting patient's functional outcome.   REHAB POTENTIAL: Good  CLINICAL DECISION MAKING: Evolving/moderate complexity  EVALUATION COMPLEXITY: Moderate   GOALS: Goals reviewed with patient? Yes  SHORT TERM GOALS: Target date: 12/26/2022    Pt will be independent with HEP.   Baseline: Goal status: INITIAL  2.  Pt will be independent with use of squatty potty, relaxed toileting mechanics, and improved bowel movement techniques in order to increase ease of bowel movements and complete evacuation.   Baseline:  Goal status: INITIAL  LONG TERM GOALS: Target date: 01/23/2023    Pt will be independent and compliant with HEP and perform all exercises correctly   Baseline:  Goal status: INITIAL  2.  Patient will use 0 pads a day and will present with no urinary leakage to Improve quality of life  Baseline:  Goal status: INITIAL  3.  Patient will report maximum 3/10 pain to improve mobility and quality of life  Baseline:  Goal status: INITIAL    PLAN:  PT FREQUENCY: 1-2x/week  PT DURATION:  8 sessions  PLANNED INTERVENTIONS: 97110-Therapeutic exercises, 97530- Therapeutic activity, 97112- Neuromuscular re-education, 97535- Self Care, [id number]- Manual therapy, Dry Needling, Joint mobilization, Joint manipulation, Spinal manipulation, Spinal mobilization, and Scar mobilization  PLAN FOR NEXT SESSION:   quadruped exercises for cystocele education  Savina Olshefski, PT 12/11/22 1:55 PM

## 2022-12-14 ENCOUNTER — Ambulatory Visit: Payer: Medicare Other

## 2022-12-14 ENCOUNTER — Ambulatory Visit
Admission: RE | Admit: 2022-12-14 | Discharge: 2022-12-14 | Disposition: A | Discharge status: Home / Self Care | Payer: Medicare Other | Source: Ambulatory Visit | Attending: Hematology and Oncology | Admitting: Hematology and Oncology

## 2022-12-14 DIAGNOSIS — R928 Other abnormal and inconclusive findings on diagnostic imaging of breast: Secondary | ICD-10-CM

## 2022-12-14 DIAGNOSIS — N6320 Unspecified lump in the left breast, unspecified quadrant: Secondary | ICD-10-CM | POA: Diagnosis not present

## 2022-12-21 ENCOUNTER — Ambulatory Visit: Payer: Medicare Other | Attending: Urology | Admitting: Physical Therapy

## 2022-12-21 DIAGNOSIS — R278 Other lack of coordination: Secondary | ICD-10-CM | POA: Insufficient documentation

## 2022-12-21 DIAGNOSIS — M62838 Other muscle spasm: Secondary | ICD-10-CM | POA: Diagnosis present

## 2022-12-21 NOTE — Therapy (Addendum)
OUTPATIENT PHYSICAL THERAPY FEMALE PELVIC EVALUATION   Patient Name: Kara Ramos MRN: [id number] DOB:[birth year], 72 y.o., female Today's Date: 12/21/2022  END OF SESSION:  PT End of Session - 12/21/22 1505     Visit Number 4    Date for PT Re-Evaluation 01/23/23    PT Start Time 1445    PT Stop Time 1530    PT Time Calculation (min) 45 min                Past Medical History:  Diagnosis Date   Allergy    Arthritis    Asthma    Breast CA (HCC)    Breast cancer (HCC)    Cancer (HCC) 1978   cervical- had hysterectomy   Cataract    Removed both eyes   Complication of anesthesia    per patient 'one time was aware and awake during surgery and also BP can get really low"   GERD (gastroesophageal reflux disease)    History of IBS    Irregular heart rhythm    occasional PAC'S, PVC's   Osteopenia    Personal history of radiation therapy    Past Surgical History:  Procedure Laterality Date   ABDOMINAL HYSTERECTOMY     both ovaries removed   APPENDECTOMY     BREAST LUMPECTOMY     BREAST LUMPECTOMY WITH RADIOACTIVE SEED LOCALIZATION Right 12/24/2018   Procedure: RIGHT BREAST LUMPECTOMY WITH RADIOACTIVE SEED LOCALIZATION;  Surgeon: Almond Lint, MD;  Location: [facility] OR;  Service: General;  Laterality: Right;   BUNIONECTOMY     both feet   CATARACT EXTRACTION     right   COLON SURGERY  1988   part of colon removed d/t endometriosis   COLONOSCOPY  05/2017   One benign polyp   CORRECTION HAMMER TOE  03/2017   Patient Active Problem List   Diagnosis Date Noted   Pulmonary nodule 08/11/2020   Essential hypertension 03/23/2019   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   Adverse food reaction 10/23/2018   Mild intermittent asthma without complication 10/23/2018   Other allergic rhinitis 10/23/2018   PVC (premature ventricular contraction) 03/21/2018   Decreased estrogen level 04/04/2017   Menopausal problem 04/04/2017   Menopausal syndrome  04/04/2017   Osteopenia 04/04/2017   Vaginal irritation 04/04/2017   Irritable bowel syndrome 02/14/1968    PCP: Jarrett Soho, PA-C Ref Provider (PCP)  REFERRING PROVIDER: Jarrett Soho, PA-C Ref Provider (PCP)  REFERRING DIAG: N39.3 (ICD-10-CM) - Stress incontinence (female) (female)   THERAPY DIAG:  Other lack of coordination  Other muscle spasm  Rationale for Evaluation and Treatment: Rehabilitation  ONSET DATE: worsening since about a year ago 10/ 2023  SUBJECTIVE:  SUBJECTIVE STATEMENT: 12/21/2022 Pt reports that she is getting over a cold, she has had some increased leaking with coughing. She is convinced that she wants to go through with the procedure- sling surgery 11/22.  Has used her cups some. Lower belly has been really aggravated - scar feels like a rope.   Pt reports that she is doing better, doing some stretches, breathing properly. Urinating is easier. Urine stream is better.  Bowel issues are better, less straining. Sling scheduled November 22. Reports that she is tight, always has been. Has got her squatty potty and her cups for scar cupping therapy. Has not used cups yet.    Pt reports that it's more difficult to expel urine. Urinating is difficult, does not have steady stream. About 6 months- year. Still has SUI- 8- 10 years. Does more Kegel type exercises, has had PFPT 3-4 years ago- that helped. Has had some bowel issues, has had to strain. Constipation.  Would like to get exercises to help her relax pelvic floor, has a sling scheduled November 22. Has to get mammogram - maybe needle bipsy, might need to push it Had a sneezing fit this morning and did not leak.      Fluid intake: Yes:      PAIN:  Are you having pain? No  PRECAUTIONS: None  RED  FLAGS: None   WEIGHT BEARING RESTRICTIONS: No  FALLS:  Has patient fallen in last 6 months? No  LIVING ENVIRONMENT: Lives with: lives alone Lives in: House/apartment Stairs: No Has following equipment at home: None  OCCUPATION: retired, Agricultural consultant work  PLOF: Independent  PATIENT GOALS: not to have to strain  PERTINENT HISTORY:   Sexual abuse: No  BOWEL MOVEMENT: Pain with bowel movement: No Type of bowel movement:Type (Bristol Stool Scale) alternates 1-7 Fully empty rectum: No Leakage: Yes: feels like she could Pads: No Fiber supplement: Yes:    URINATION: Pain with urination: No Fully empty bladder: Yes: most of the time Stream: Weak Urgency: No Frequency: no Leakage: Coughing, Sneezing, and Laughing, going to the bathroom, steps when carryng something heavy Pads: yes  INTERCOURSE: Pain with intercourse:  not active  PREGNANCY: Vaginal deliveries 0 PROLAPSE: Cystocele     OBJECTIVE:  Note: Objective measures were completed at Evaluation unless otherwise noted.  COGNITION: Overall cognitive status: Within functional limits for tasks assessed     SENSATION: Light touch: Appears intact Proprioception: Appears intact  MUSCLE LENGTH: Hamstrings: Right tight Left tight Thomas test: Right tight ; Left tight     POSTURE:  upper chest breathing, abdominal gripping, difficulty with relaxing abdomen, protracted head, stiff throughout, decreased movement with walking  PELVIC ALIGNMENT: NA  LUMBARAROM/PROM: tightness throughout   LOWER EXTREMITY ROM: grossly screened d/t time - limited  throughout  LOWER EXTREMITY MMT:  PALPATION:   General  within functional limitations                 External Perineal Exam within functional limitations                              Internal Pelvic Floor decent strength LA, some difficulty with bulging and awareness, anterior wall laxity, tenderness in first layer  Patient confirms identification and approves  PT to assess internal pelvic floor and treatment Yes  PELVIC MMT:   MMT eval  Vaginal 3/5  Internal Anal Sphincter   External Anal Sphincter   Puborectalis   Diastasis Recti Significant  abdominal scars present  (Blank rows = not tested)        TONE: Anterior wall laxity  PROLAPSE: cystocele  TODAY'S TREATMENT:                                                                                                                              DATE:  11/7/20024 Manual therapy- trial of fry needling and STM abdominal scar   Therapeutic exercises- ball press with TRA breath supine, lower trunk rotation and quadruped PF contraction         Neuromuscular re-education: bridging with TRANSVERSE ABDOMINIS BREATH, rowing with TRANSVERSE ABDOMINIS BREATH with minisquat   Therapeutic exercise: open books, child's pose, DB, piriformis stretch, butterfly, DB, hip adduction with ball with TRANSVERSE ABDOMINIS BREATH            EVAL see above   PATIENT EDUCATION/ ther acts:  Education details: pt educated on abdominal scar massage and cupping, diaphragmatic breathing to lengthen PF, constipation management- using a squatty potty Person educated: Patient Education method: Explanation and Handouts Education comprehension: verbalized understanding and needs further education  HOME EXERCISE PROGRAM:  [id number]  ASSESSMENT:  CLINICAL IMPRESSION: Pt did well with trial of dry needling for her tight abdominal scar and scar massage as well as exercises. She had immediate relief in the scar area. Discussed continuing with exercises, pt might come before her surgery if able, discussed coming to PT PRN post surgery.     Pt progressing well, happy with her progress, SUI better. Added new exercises for core and PF activation to her HEP. Pt is able to have improved coordination and activation of core and pelvic floor with breath.    Pt did well with her exercises, discussed getting  squatty potty to help with emptying. Pt scheduled to have a sling surgery in Novemeber. Pt with tight abdominal scar- did well with manual therapy and ordered cups for home scar massage.  Patient is a 72 y.o. F who was seen today for physical therapy evaluation and treatment for difficulty with urination, stress urinary incontinence and PT  before sling surgery. She presents with abdominal gripping, difficulty with pelvic floor lengthening and stiffness throughout her back and pelvis. Some tenderness abdominal scar and restrictions throughout abdomen and pelvic floor. She did well today with education and internal muscle assessment and treatment and exercises, She will benefit from PT to improve urination and reduce SUI and improve QOL  Trigger Point Dry-Needling  Treatment instructions: Expect mild to moderate muscle soreness. S/S of pneumothorax if dry needled over a lung field, and to seek immediate medical attention should they occur. Patient verbalized understanding of these instructions and education.  Patient Consent Given: Yes Education handout provided: Yes Muscles treated: abdominal scar/ rectus abdominis Treatment response/outcome: Utilized skilled palpation to identify trigger points.  During dry needling able to palpate muscle twitch and muscle elongation   Skilled palpation and monitoring by PT during dry needling   OBJECTIVE  IMPAIRMENTS: decreased coordination, difficulty walking, decreased ROM, decreased strength, hypomobility, increased fascial restrictions, increased muscle spasms, impaired flexibility, impaired sensation, and pain.   ACTIVITY LIMITATIONS: continence  PARTICIPATION LIMITATIONS: community activity  PERSONAL FACTORS: Fitness and Time since onset of injury/illness/exacerbation are also affecting patient's functional outcome.   REHAB POTENTIAL: Good  CLINICAL DECISION MAKING: Evolving/moderate complexity  EVALUATION COMPLEXITY: Moderate   GOALS: Goals  reviewed with patient? Yes  SHORT TERM GOALS: Target date: 12/26/2022    Pt will be independent with HEP.   Baseline: Goal status: INITIAL  2.  Pt will be independent with use of squatty potty, relaxed toileting mechanics, and improved bowel movement techniques in order to increase ease of bowel movements and complete evacuation.   Baseline:  Goal status: INITIAL  LONG TERM GOALS: Target date: 01/23/2023    Pt will be independent and compliant with HEP and perform all exercises correctly   Baseline:  Goal status: INITIAL  2.  Patient will use 0 pads a day and will present with no urinary leakage to Improve quality of life  Baseline:  Goal status: INITIAL  3.  Patient will report maximum 3/10 pain to improve mobility and quality of life  Baseline:  Goal status: INITIAL    PLAN:  PT FREQUENCY: 1-2x/week  PT DURATION:  8 sessions  PLANNED INTERVENTIONS: 97110-Therapeutic exercises, 97530- Therapeutic activity, 97112- Neuromuscular re-education, 97535- Self Care, 82956- Manual therapy, Dry Needling, Joint mobilization, Joint manipulation, Spinal manipulation, Spinal mobilization, and Scar mobilization  PLAN FOR NEXT SESSION:  quadruped exercises for cystocele education  Dameon Soltis, PT 12/21/22 3:35 PM

## 2022-12-29 ENCOUNTER — Ambulatory Visit: Payer: Medicare Other | Admitting: Physical Therapy

## 2022-12-29 ENCOUNTER — Other Ambulatory Visit: Payer: Self-pay | Admitting: Urology

## 2022-12-29 DIAGNOSIS — R278 Other lack of coordination: Secondary | ICD-10-CM | POA: Diagnosis not present

## 2022-12-29 DIAGNOSIS — M62838 Other muscle spasm: Secondary | ICD-10-CM | POA: Diagnosis not present

## 2022-12-29 NOTE — Therapy (Signed)
OUTPATIENT PHYSICAL THERAPY FEMALE PELVIC EVALUATION   Patient Name: Kara Ramos MRN: [id number] DOB:April 05, 1950, 72 y.o., female Today's Date: 12/29/2022  END OF SESSION:  PT End of Session - 12/29/22 1059     Visit Number 5    PT Start Time 1015    PT Stop Time 1056    PT Time Calculation (min) 41 min    Activity Tolerance Patient tolerated treatment well    Behavior During Therapy WFL for tasks assessed/performed                 Past Medical History:  Diagnosis Date   Allergy    Arthritis    Asthma    Breast CA (HCC)    Breast cancer (HCC)    Cancer (HCC) 1978   cervical- had hysterectomy   Cataract    Removed both eyes   Complication of anesthesia    per patient 'one time was aware and awake during surgery and also BP can get really low"   GERD (gastroesophageal reflux disease)    History of IBS    Irregular heart rhythm    occasional PAC'S, PVC's   Osteopenia    Personal history of radiation therapy    Past Surgical History:  Procedure Laterality Date   ABDOMINAL HYSTERECTOMY     both ovaries removed   APPENDECTOMY     BREAST LUMPECTOMY     BREAST LUMPECTOMY WITH RADIOACTIVE SEED LOCALIZATION Right 12/24/2018   Procedure: RIGHT BREAST LUMPECTOMY WITH RADIOACTIVE SEED LOCALIZATION;  Surgeon: Almond Lint, MD;  Location: [facility] OR;  Service: General;  Laterality: Right;   BUNIONECTOMY     both feet   CATARACT EXTRACTION     right   COLON SURGERY  1988   part of colon removed d/t endometriosis   COLONOSCOPY  05/2017   One benign polyp   CORRECTION HAMMER TOE  03/2017   Patient Active Problem List   Diagnosis Date Noted   Pulmonary nodule 08/11/2020   Essential hypertension 03/23/2019   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   Adverse food reaction 10/23/2018   Mild intermittent asthma without complication 10/23/2018   Other allergic rhinitis 10/23/2018   PVC (premature ventricular contraction) 03/21/2018   Decreased  estrogen level 04/04/2017   Menopausal problem 04/04/2017   Menopausal syndrome 04/04/2017   Osteopenia 04/04/2017   Vaginal irritation 04/04/2017   Irritable bowel syndrome 02/14/1968    PCP: Jarrett Soho, PA-C Ref Provider (PCP)  REFERRING PROVIDER: Jarrett Soho, PA-C Ref Provider (PCP)  REFERRING DIAG: N39.3 (ICD-10-CM) - Stress incontinence (female) (female)   THERAPY DIAG:  Other lack of coordination  Other muscle spasm  Rationale for Evaluation and Treatment: Rehabilitation  ONSET DATE: worsening since about a year ago 10/ 2023  SUBJECTIVE:  SUBJECTIVE STATEMENT: Pt's sx scheduled next Friday 5 a.m.  Pt still has a little cold, not coughing.     12/21/2022 Pt reports that she is getting over a cold, she has had some increased leaking with coughing. She is convinced that she wants to go through with the procedure- sling surgery 11/22.  Has used her cups some. Lower belly has been really aggravated - scar feels like a rope.   Pt reports that she is doing better, doing some stretches, breathing properly. Urinating is easier. Urine stream is better.  Bowel issues are better, less straining. Sling scheduled November 22. Reports that she is tight, always has been. Has got her squatty potty and her cups for scar cupping therapy. Has not used cups yet.    Pt reports that it's more difficult to expel urine. Urinating is difficult, does not have steady stream. About 6 months- year. Still has SUI- 8- 10 years. Does more Kegel type exercises, has had PFPT 3-4 years ago at [facility] - that helped. Has had some bowel issues, has had to strain. Constipation.  Would like to get exercises to help her relax pelvic floor, has a sling scheduled November 22. Has to get mammogram - maybe needle  bipsy, might need to push it Had a sneezing fit this morning and did not leak.      Fluid intake: Yes:      PAIN:  Are you having pain? No  PRECAUTIONS: None  RED FLAGS: None   WEIGHT BEARING RESTRICTIONS: No  FALLS:  Has patient fallen in last 6 months? No  LIVING ENVIRONMENT: Lives with: lives alone Lives in: House/apartment Stairs: No Has following equipment at home: None  OCCUPATION: retired, Agricultural consultant work  PLOF: Independent  PATIENT GOALS: not to have to strain  PERTINENT HISTORY:   Sexual abuse: No  BOWEL MOVEMENT: Pain with bowel movement: No Type of bowel movement:Type (Bristol Stool Scale) alternates 1-7 Fully empty rectum: No Leakage: Yes: feels like she could Pads: No Fiber supplement: Yes:    URINATION: Pain with urination: No Fully empty bladder: Yes: most of the time Stream: Weak Urgency: No Frequency: no Leakage: Coughing, Sneezing, and Laughing, going to the bathroom, steps when carryng something heavy Pads: yes  INTERCOURSE: Pain with intercourse:  not active  PREGNANCY: Vaginal deliveries 0 PROLAPSE: Cystocele     OBJECTIVE:  Note: Objective measures were completed at Evaluation unless otherwise noted.  COGNITION: Overall cognitive status: Within functional limits for tasks assessed     SENSATION: Light touch: Appears intact Proprioception: Appears intact  MUSCLE LENGTH: Hamstrings: Right tight Left tight Thomas test: Right tight ; Left tight     POSTURE:  upper chest breathing, abdominal gripping, difficulty with relaxing abdomen, protracted head, stiff throughout, decreased movement with walking  PELVIC ALIGNMENT: NA  LUMBARAROM/PROM: tightness throughout   LOWER EXTREMITY ROM: grossly screened d/t time - limited  throughout  LOWER EXTREMITY MMT:  PALPATION:   General  within functional limitations                 External Perineal Exam within functional limitations                               Internal Pelvic Floor decent strength LA, some difficulty with bulging and awareness, anterior wall laxity, tenderness in first layer  Patient confirms identification and approves PT to assess internal pelvic floor and treatment Yes  PELVIC MMT:  MMT eval  Vaginal 3/5  Internal Anal Sphincter 5/5  External Anal Sphincter 5/5  Puborectalis 5/5  Diastasis Recti Significant abdominal scars present  (Blank rows = not tested)        TONE: Anterior vaginal wall laxity, high tone pelvic floor muscles vaginally and rectally  PROLAPSE: cystocele  TODAY'S TREATMENT:                                                                                                                              DATE:  11/15 Manual- pelvic floor muscle assessment and treatment - vaginally and rectally    Therapeutic  activity- review of HEP, surgery expectations, education on recovery, progress with PT, review of goals, education on exam findings, constipation management  Therapeutic exercises- reverse Kegel, glute stretch with reverse Kegel, diaphragmatic breathing   11/7/20024 Manual therapy- trial of dry needling and STM abdominal scar   Therapeutic exercises- ball press with TRA breath supine, lower trunk rotation and quadruped PF contraction         Neuromuscular re-education: bridging with TRANSVERSE ABDOMINIS BREATH, rowing with TRANSVERSE ABDOMINIS BREATH with minisquat   Therapeutic exercise: open books, child's pose, DB, piriformis stretch, butterfly, DB, hip adduction with ball with TRANSVERSE ABDOMINIS BREATH            EVAL see above   PATIENT EDUCATION/ ther acts:  Education details: pt educated on abdominal scar massage and cupping, diaphragmatic breathing to lengthen PF, constipation management- using a squatty potty Person educated: Patient Education method: Explanation and Handouts Education comprehension: verbalized understanding and needs further education  HOME  EXERCISE PROGRAM:  [id number]  ASSESSMENT:  CLINICAL IMPRESSION: Pt has done well in PT, she demonstrates difficulty with lengthening her pelvic floor checked vaginally and rectally, anterior wall laxity and abdominal scar with reduced tenderness. Pt reported that she is constipated at times, does not fully empty rectum and at times is close to leaking.She reported that exercises are going well and that she is aware of tension holding. She seems ready for her surgery. Will come to PT PRN.    Pt did well with trial of dry needling for her tight abdominal scar and scar massage as well as exercises. She had immediate relief in the scar area. Discussed continuing with exercises, pt might come before her surgery if able, discussed coming to PT PRN post surgery.     Pt progressing well, happy with her progress, SUI better. Added new exercises for core and PF activation to her HEP. Pt is able to have improved coordination and activation of core and pelvic floor with breath.    Pt did well with her exercises, discussed getting squatty potty to help with emptying. Pt scheduled to have a sling surgery in Novemeber. Pt with tight abdominal scar- did well with manual therapy and ordered cups for home scar massage.  Patient is a 72 y.o. F who was seen today for physical therapy evaluation and treatment for  difficulty with urination, stress urinary incontinence and PT  before sling surgery. She presents with abdominal gripping, difficulty with pelvic floor lengthening and stiffness throughout her back and pelvis. Some tenderness abdominal scar and restrictions throughout abdomen and pelvic floor. She did well today with education and internal muscle assessment and treatment and exercises, She will benefit from PT to improve urination and reduce SUI and improve QOL  Trigger Point Dry-Needling  Treatment instructions: Expect mild to moderate muscle soreness. S/S of pneumothorax if dry needled over a lung field,  and to seek immediate medical attention should they occur. Patient verbalized understanding of these instructions and education.  Patient Consent Given: Yes Education handout provided: Yes Muscles treated: abdominal scar/ rectus abdominis Treatment response/outcome: Utilized skilled palpation to identify trigger points.  During dry needling able to palpate muscle twitch and muscle elongation   Skilled palpation and monitoring by PT during dry needling   OBJECTIVE IMPAIRMENTS: decreased coordination, difficulty walking, decreased ROM, decreased strength, hypomobility, increased fascial restrictions, increased muscle spasms, impaired flexibility, impaired sensation, and pain.   ACTIVITY LIMITATIONS: continence  PARTICIPATION LIMITATIONS: community activity  PERSONAL FACTORS: Fitness and Time since onset of injury/illness/exacerbation are also affecting patient's functional outcome.   REHAB POTENTIAL: Good  CLINICAL DECISION MAKING: Evolving/moderate complexity  EVALUATION COMPLEXITY: Moderate   GOALS: Goals reviewed with patient? Yes  SHORT TERM GOALS: Target date: 12/26/2022    Pt will be independent with HEP.   Baseline: Goal status: met  2.  Pt will be independent with use of squatty potty, relaxed toileting mechanics, and improved bowel movement techniques in order to increase ease of bowel movements and complete evacuation.   Baseline:  Goal status: progressing  LONG TERM GOALS: Target date: 01/23/2023    Pt will be independent and compliant with HEP and perform all exercises correctly   Baseline:  Goal status: INITIAL  2.  Patient will use 0 pads a day and will present with no urinary leakage to Improve quality of life  Baseline:  Goal status: INITIAL  3.  Patient will report maximum 3/10 pain to improve mobility and quality of life  Baseline:  Goal status: INITIAL    PLAN:  PT FREQUENCY: 1-2x/week  PT DURATION:  8 sessions  PLANNED INTERVENTIONS:  97110-Therapeutic exercises, 97530- Therapeutic activity, 97112- Neuromuscular re-education, 97535- Self Care, [id number]- Manual therapy, Dry Needling, Joint mobilization, Joint manipulation, Spinal manipulation, Spinal mobilization, and Scar mobilization  PLAN FOR NEXT SESSION:  discharge PT Willamina Grieshop, PT 12/29/22 11:05 AM

## 2023-01-01 NOTE — Progress Notes (Signed)
Anesthesia Review:  PCP: Cardiologist : Chest x-ray : CT Super Chest- 02/19/22  EKG : Echo : Stress test: Cardiac Cath :  Activity level:  Sleep Study/ CPAP : Fasting Blood Sugar :      / Checks Blood Sugar -- times a day:   Blood Thinner/ Instructions /Last Dose: ASA / Instructions/ Last Dose :

## 2023-01-02 NOTE — Patient Instructions (Signed)
SURGICAL WAITING ROOM VISITATION  Patients having surgery or a procedure may have no more than 2 support people in the waiting area - these visitors may rotate.    Children under the age of 80 must have an adult with them who is not the patient.  Due to an increase in RSV and influenza rates and associated hospitalizations, children ages 33 and under may not visit patients in [facility] hospitals.  If the patient needs to stay at the hospital during part of their recovery, the visitor guidelines for inpatient rooms apply. Pre-op nurse will coordinate an appropriate time for 1 support person to accompany patient in pre-op.  This support person may not rotate.    Please refer to the [facility] website for the visitor guidelines for Inpatients (after your surgery is over and you are in a regular room).       Your procedure is scheduled on:  01/05/23    Report to [facility] Main Entrance    Report to admitting at   [phone]   Call this number if you have problems the morning of surgery [phone]   Do not eat food or drink liquids  :After Midnight.                            If you have questions, please contact your surgeon's office.       Oral Hygiene is also important to reduce your risk of infection.                                    Remember - BRUSH YOUR TEETH THE MORNING OF SURGERY WITH YOUR REGULAR TOOTHPASTE  DENTURES WILL BE REMOVED PRIOR TO SURGERY PLEASE DO NOT APPLY "Poly grip" OR ADHESIVES!!!   Do NOT smoke after Midnight   Stop all vitamins and herbal supplements 7 days before surgery.   Take these medicines the morning of surgery with A SIP OF WATER:  inhalers as usual and bring, zyrtec, toprol, omeprazole   DO NOT TAKE ANY ORAL DIABETIC MEDICATIONS DAY OF YOUR SURGERY  Bring CPAP mask and tubing day of surgery.                              You may not have any metal on your body including hair pins, jewelry, and body piercing             Do  not wear make-up, lotions, powders, perfumes/cologne, or deodorant  Do not wear nail polish including gel and S&S, artificial/acrylic nails, or any other type of covering on natural nails including finger and toenails. If you have artificial nails, gel coating, etc. that needs to be removed by a nail salon please have this removed prior to surgery or surgery may need to be canceled/ delayed if the surgeon/ anesthesia feels like they are unable to be safely monitored.   Do not shave  48 hours prior to surgery.               Men may shave face and neck.   Do not bring valuables to the hospital.  IS NOT             RESPONSIBLE   FOR VALUABLES.   Contacts, glasses, dentures or bridgework may not be worn into surgery.   Bring  small overnight bag day of surgery.   DO NOT BRING YOUR HOME MEDICATIONS TO THE HOSPITAL. PHARMACY WILL DISPENSE MEDICATIONS LISTED ON YOUR MEDICATION LIST TO YOU DURING YOUR ADMISSION IN THE HOSPITAL!    Patients discharged on the day of surgery will not be allowed to drive home.  Someone NEEDS to stay with you for the first 24 hours after anesthesia.   Special Instructions: Bring a copy of your healthcare power of attorney and living will documents the day of surgery if you haven't scanned them before.              Please read over the following fact sheets you were given: IF YOU HAVE QUESTIONS ABOUT YOUR PRE-OP INSTRUCTIONS PLEASE CALL [phone]   If you received a COVID test during your pre-op visit  it is requested that you wear a mask when out in public, stay away from anyone that may not be feeling well and notify your surgeon if you develop symptoms. If you test positive for Covid or have been in contact with anyone that has tested positive in the last 10 days please notify you surgeon.    [place] - Preparing for Surgery Before surgery, you can play an important role.  Because skin is not sterile, your skin needs to be as free of germs as  possible.  You can reduce the number of germs on your skin by washing with CHG (chlorahexidine gluconate) soap before surgery.  CHG is an antiseptic cleaner which kills germs and bonds with the skin to continue killing germs even after washing. Please DO NOT use if you have an allergy to CHG or antibacterial soaps.  If your skin becomes reddened/irritated stop using the CHG and inform your nurse when you arrive at Short Stay. Do not shave (including legs and underarms) for at least 48 hours prior to the first CHG shower.  You may shave your face/neck. Please follow these instructions carefully:  1.  Shower with CHG Soap the night before surgery and the  morning of Surgery.  2.  If you choose to wash your hair, wash your hair first as usual with your  normal  shampoo.  3.  After you shampoo, rinse your hair and body thoroughly to remove the  shampoo.                           4.  Use CHG as you would any other liquid soap.  You can apply chg directly  to the skin and wash                       Gently with a scrungie or clean washcloth.  5.  Apply the CHG Soap to your body ONLY FROM THE NECK DOWN.   Do not use on face/ open                           Wound or open sores. Avoid contact with eyes, ears mouth and genitals (private parts).                       Wash face,  Genitals (private parts) with your normal soap.             6.  Wash thoroughly, paying special attention to the area where your surgery  will be performed.  7.  Thoroughly rinse your body with  warm water from the neck down.  8.  DO NOT shower/wash with your normal soap after using and rinsing off  the CHG Soap.                9.  Pat yourself dry with a clean towel.            10.  Wear clean pajamas.            11.  Place clean sheets on your bed the night of your first shower and do not  sleep with pets. Day of Surgery : Do not apply any lotions/deodorants the morning of surgery.  Please wear clean clothes to the hospital/surgery  center.  FAILURE TO FOLLOW THESE INSTRUCTIONS MAY RESULT IN THE CANCELLATION OF YOUR SURGERY PATIENT SIGNATURE_________________________________  NURSE SIGNATURE__________________________________  ________________________________________________________________________

## 2023-01-03 ENCOUNTER — Encounter (HOSPITAL_COMMUNITY): Payer: Self-pay

## 2023-01-03 ENCOUNTER — Other Ambulatory Visit: Payer: Self-pay

## 2023-01-03 ENCOUNTER — Encounter (HOSPITAL_COMMUNITY)
Admission: RE | Admit: 2023-01-03 | Discharge: 2023-01-03 | Disposition: A | Discharge status: Home / Self Care | Payer: Medicare Other | Source: Ambulatory Visit | Attending: Urology | Admitting: Urology

## 2023-01-03 VITALS — BP 131/59 | HR 67 | Temp 98.2°F | Resp 16 | Ht 62.0 in | Wt 177.0 lb

## 2023-01-03 DIAGNOSIS — Z87891 Personal history of nicotine dependence: Secondary | ICD-10-CM | POA: Diagnosis not present

## 2023-01-03 DIAGNOSIS — R918 Other nonspecific abnormal finding of lung field: Secondary | ICD-10-CM | POA: Insufficient documentation

## 2023-01-03 DIAGNOSIS — Z79899 Other long term (current) drug therapy: Secondary | ICD-10-CM | POA: Insufficient documentation

## 2023-01-03 DIAGNOSIS — J45909 Unspecified asthma, uncomplicated: Secondary | ICD-10-CM | POA: Diagnosis not present

## 2023-01-03 DIAGNOSIS — Z923 Personal history of irradiation: Secondary | ICD-10-CM | POA: Diagnosis not present

## 2023-01-03 DIAGNOSIS — Z01810 Encounter for preprocedural cardiovascular examination: Secondary | ICD-10-CM | POA: Diagnosis not present

## 2023-01-03 DIAGNOSIS — Z01818 Encounter for other preprocedural examination: Secondary | ICD-10-CM | POA: Insufficient documentation

## 2023-01-03 DIAGNOSIS — K219 Gastro-esophageal reflux disease without esophagitis: Secondary | ICD-10-CM | POA: Insufficient documentation

## 2023-01-03 DIAGNOSIS — Z853 Personal history of malignant neoplasm of breast: Secondary | ICD-10-CM | POA: Insufficient documentation

## 2023-01-03 DIAGNOSIS — Z01812 Encounter for preprocedural laboratory examination: Secondary | ICD-10-CM | POA: Diagnosis not present

## 2023-01-03 HISTORY — DX: Atrial premature depolarization: I49.1

## 2023-01-03 LAB — BASIC METABOLIC PANEL
Anion gap: 9 (ref 5–15)
BUN: 19 mg/dL (ref 8–23)
CO2: 22 mmol/L (ref 22–32)
Calcium: 9 mg/dL (ref 8.9–10.3)
Chloride: 107 mmol/L (ref 98–111)
Creatinine, Ser: 0.98 mg/dL (ref 0.44–1.00)
GFR, Estimated: >60 mL/min (ref >60)
Glucose, Bld: 115 mg/dL — ABNORMAL HIGH (ref 70–99)
Potassium: 3.4 mmol/L — ABNORMAL LOW (ref 3.5–5.1)
Sodium: 138 mmol/L (ref 135–145)

## 2023-01-03 LAB — CBC
HCT: 38.5 % (ref 36.0–46.0)
Hemoglobin: 12.6 g/dL (ref 12.0–15.0)
MCH: 29.3 pg (ref 26.0–34.0)
MCHC: 32.7 g/dL (ref 30.0–36.0)
MCV: 89.5 fL (ref 80.0–100.0)
Platelets: 217 10*3/uL (ref 150–400)
RBC: 4.3 MIL/uL (ref 3.87–5.11)
RDW: 13.2 % (ref 11.5–15.5)
WBC: 4.9 10*3/uL (ref 4.0–10.5)
nRBC: 0 % (ref 0.0–0.2)

## 2023-01-03 NOTE — Progress Notes (Signed)
DISCUSSION: Kara Ramos is a 72 yo female who presents to PAT prior to CYSTOSCOPY and MID-URETHRAL SLING on 01/05/23 with Dr. Marlou Porch. PMH of former smoking, palpitations, pulmonary nodules, asthma, GERD, breast cancer s/p lumpectomy and XRT (2020), arthritis.  Prior complications from anesthesia include intra op awareness and hypotension per patient  Patient was a value by cardiology in 2021 for palpitations. Patient's prior cardiac workup in 2018 was essentially unremarkable with normal LV function, mild MR, possible PFO.  Exercise stress test with normal exercise capacity and no ischemic changes. Event monitor showed frequent PVCs, one 3 second pause at 1:49 AM, no other abnormalities.  She was advised to follow-up on an as-needed basis  Patient follows with pulmonology due to pulmonary nodules with history of cancer.  She has been undergoing CT chest for surveillance yearly and pulmonary nodules have been stable.  VS: BP (!) 131/59   Pulse 67   Temp 36.8 C (Oral)   Resp 16   Ht 5\' 2"  (1.575 m)   Wt 80.3 kg   SpO2 99%   BMI 32.37 kg/m   PROVIDERS: Jarrett Soho, PA-C   LABS: Labs reviewed: Acceptable for surgery. (all labs ordered are listed, but only abnormal results are displayed)  Labs Reviewed  BASIC METABOLIC PANEL - Abnormal; Notable for the following components:      Result Value   Potassium 3.4 (*)    Glucose, Bld 115 (*)    All other components within normal limits  CBC     IMAGES: CT Chest 02/17/22:  IMPRESSION: Multiple small lung nodules are identified once again. These are relatively similar to previous except for a decreased area in the left lower lobe. Some of these areas appear branching and could represent areas of opacity along subtle bronchiectasis. No new dominant nodule. Recommend continued surveillance in 12 months to ensure long-term stability.   EKG: 01/03/2023 Normal sinus rhythm, rate 67 Low voltage QRS Nonspecific T wave  abnormality  CV:   Echocardiogram 12/12/2016: Left ventricle cavity is normal in size. Mild concentric hypertrophy of the left ventricle. Normal global wall motion. LVEF 55-60% Left atrial cavity is mildly dilated. 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 Mild (Grade I) mitral regurgitation. IVC is dilated with a respiratory response of <50%. This may suggest elevated right heart pressure.   Event monitor 11/09/2016-12/08/2016: Sinus rhythm with tachycardia up to 140 bpm.  Sinus bradycardia with 3.0 sec pause at 1:49 AM.  No symptoms associated with this.  Occasional lightheadedness, flutter during sinus rhythm.  No atrial fibrillation, atrial flutter.   Treadmill exercise stress test 11/13/2016: Resting EKG: Normal sinus rhythm.  Stress EKG: Negative for ischemia  Normal blood pressure response.  Occasional PACs throughout the stress test.  No atrial fibrillation or heart block.  Normal pressure response.  Exercised for 5:02 minutes, achieved 4.9 mets.  Exercise capacity is low. Past Medical History:  Diagnosis Date   Allergy    Arthritis    Asthma    Breast CA (HCC)    Breast cancer (HCC)    Cancer (HCC) 1978   cervical- had hysterectomy   Cataract    Removed both eyes   Complication of anesthesia    per patient 'one time was aware and awake during surgery and also BP can get really low"   GERD (gastroesophageal reflux disease)    History of IBS    Irregular heart rhythm    occasional PAC'S, PVC's   Osteopenia    PAC (premature atrial  contraction)    hx of   Personal history of radiation therapy     Past Surgical History:  Procedure Laterality Date   ABDOMINAL HYSTERECTOMY     both ovaries removed   APPENDECTOMY     BREAST LUMPECTOMY     BREAST LUMPECTOMY WITH RADIOACTIVE SEED LOCALIZATION Right 12/24/2018   Procedure: RIGHT BREAST LUMPECTOMY WITH RADIOACTIVE SEED LOCALIZATION;  Surgeon: Almond Lint, MD;  Location: [facility] OR;  Service: General;   Laterality: Right;   BUNIONECTOMY     both feet   CATARACT EXTRACTION     right   COLON SURGERY  1988   part of colon removed d/t endometriosis   COLONOSCOPY  05/2017   One benign polyp   CORRECTION HAMMER TOE  03/2017    MEDICATIONS:  acetaminophen (TYLENOL) 500 MG tablet   albuterol (VENTOLIN HFA) 108 (90 Base) MCG/ACT inhaler   Ascorbic Acid (VITAMIN C) 500 MG CAPS   Calcium Carb-Cholecalciferol (CALCIUM 500 + D PO)   calcium elemental as carbonate (TUMS ULTRA 1000) 400 MG chewable tablet   Cholecalciferol (VITAMIN D3 ULTRA STRENGTH) 125 MCG (5000 UT) capsule   CRANBERRY PO   desonide (DESOWEN) 0.05 % cream   fluticasone (FLONASE) 50 MCG/ACT nasal spray   hydrocortisone (ANUSOL-HC) 2.5 % rectal cream   loratadine (CLARITIN) 10 MG tablet   Methylcellulose, Laxative, (CITRUCEL PO)   metoprolol succinate (TOPROL-XL) 25 MG 24 hr tablet   omeprazole (PRILOSEC) 20 MG capsule   PREMARIN vaginal cream   Probiotic Product (PROBIOTIC PO)   tamoxifen (NOLVADEX) 20 MG tablet   No current facility-administered medications for this encounter.   Marcille Blanco [facility] Surgical Short Stay/Anesthesiology [facility] Phone [phone] 01/03/2023 12:40 PM

## 2023-01-03 NOTE — Anesthesia Preprocedure Evaluation (Addendum)
Anesthesia Evaluation  Patient identified by MRN, date of birth, ID band Patient awake    Reviewed: Allergy & Precautions, NPO status , Patient's Chart, lab work & pertinent test results  History of Anesthesia Complications (+) AWARENESS UNDER ANESTHESIA and history of anesthetic complications  Airway Mallampati: II  TM Distance: >3 FB Neck ROM: Full    Dental  (+) Dental Advisory Given, Teeth Intact   Pulmonary asthma , former smoker   Pulmonary exam normal        Cardiovascular hypertension, Pt. on home beta blockers and Pt. on medications Normal cardiovascular exam     Neuro/Psych negative neurological ROS  negative psych ROS   GI/Hepatic Neg liver ROS,GERD  Medicated and Controlled,,  Endo/Other   Obesity   Renal/GU negative Renal ROS  Female GU complaint     Musculoskeletal  (+) Arthritis ,    Abdominal   Peds  Hematology negative hematology ROS (+)   Anesthesia Other Findings   Reproductive/Obstetrics                             Anesthesia Physical Anesthesia Plan  ASA: 2  Anesthesia Plan: General   Post-op Pain Management: Tylenol PO (pre-op)*   Induction: Intravenous  PONV Risk Score and Plan: 3 and Treatment may vary due to age or medical condition, Ondansetron and Dexamethasone  Airway Management Planned: LMA  Additional Equipment: None  Intra-op Plan:   Post-operative Plan: Extubation in OR  Informed Consent: I have reviewed the patients History and Physical, chart, labs and discussed the procedure including the risks, benefits and alternatives for the proposed anesthesia with the patient or authorized representative who has indicated his/her understanding and acceptance.     Dental advisory given  Plan Discussed with: CRNA and Anesthesiologist  Anesthesia Plan Comments: (See PAT note from 11/20 by K Gekas PA-C. Hx of awareness x 2 in 1970's and 1980's.  Will utilize BIS monitor )        Anesthesia Quick Evaluation

## 2023-01-05 ENCOUNTER — Ambulatory Visit (HOSPITAL_COMMUNITY)
Admission: RE | Admit: 2023-01-05 | Discharge: 2023-01-05 | Disposition: A | Discharge status: Home / Self Care | Payer: Medicare Other | Attending: Urology | Admitting: Urology

## 2023-01-05 ENCOUNTER — Other Ambulatory Visit: Payer: Self-pay

## 2023-01-05 ENCOUNTER — Ambulatory Visit (HOSPITAL_BASED_OUTPATIENT_CLINIC_OR_DEPARTMENT_OTHER): Payer: Medicare Other | Admitting: Anesthesiology

## 2023-01-05 ENCOUNTER — Encounter (HOSPITAL_COMMUNITY)
Admission: RE | Disposition: A | Discharge status: Home / Self Care | Payer: Self-pay | Source: Home / Self Care | Attending: Urology

## 2023-01-05 ENCOUNTER — Encounter (HOSPITAL_COMMUNITY): Payer: Self-pay | Admitting: Urology

## 2023-01-05 ENCOUNTER — Ambulatory Visit (HOSPITAL_COMMUNITY): Payer: Medicare Other | Admitting: Medical

## 2023-01-05 DIAGNOSIS — Z01818 Encounter for other preprocedural examination: Secondary | ICD-10-CM

## 2023-01-05 DIAGNOSIS — I10 Essential (primary) hypertension: Secondary | ICD-10-CM | POA: Diagnosis not present

## 2023-01-05 DIAGNOSIS — E669 Obesity, unspecified: Secondary | ICD-10-CM | POA: Diagnosis not present

## 2023-01-05 DIAGNOSIS — M1990 Unspecified osteoarthritis, unspecified site: Secondary | ICD-10-CM | POA: Insufficient documentation

## 2023-01-05 DIAGNOSIS — J45909 Unspecified asthma, uncomplicated: Secondary | ICD-10-CM | POA: Insufficient documentation

## 2023-01-05 DIAGNOSIS — K219 Gastro-esophageal reflux disease without esophagitis: Secondary | ICD-10-CM | POA: Diagnosis not present

## 2023-01-05 DIAGNOSIS — N393 Stress incontinence (female) (male): Secondary | ICD-10-CM

## 2023-01-05 DIAGNOSIS — Z87891 Personal history of nicotine dependence: Secondary | ICD-10-CM | POA: Diagnosis not present

## 2023-01-05 DIAGNOSIS — Z87440 Personal history of urinary (tract) infections: Secondary | ICD-10-CM | POA: Diagnosis not present

## 2023-01-05 DIAGNOSIS — Z6832 Body mass index (BMI) 32.0-32.9, adult: Secondary | ICD-10-CM | POA: Diagnosis not present

## 2023-01-05 DIAGNOSIS — Z79899 Other long term (current) drug therapy: Secondary | ICD-10-CM | POA: Diagnosis not present

## 2023-01-05 HISTORY — PX: CYSTOSCOPY: SHX5120

## 2023-01-05 HISTORY — PX: PUBOVAGINAL SLING: SHX1035

## 2023-01-05 SURGERY — CREATION, PUBOVAGINAL SLING
Anesthesia: General

## 2023-01-05 MED ORDER — MIDAZOLAM HCL 5 MG/5ML IJ SOLN
INTRAMUSCULAR | Status: DC | PRN
  Administered 2023-01-05: 2 mg via INTRAVENOUS

## 2023-01-05 MED ORDER — CHLORHEXIDINE GLUCONATE 0.12 % MT SOLN
15.0000 mL | Freq: Once | OROMUCOSAL | Status: AC
Start: 2023-01-05 — End: 2023-01-05
  Administered 2023-01-05: 15 mL via OROMUCOSAL

## 2023-01-05 MED ORDER — LACTATED RINGERS IV SOLN: INTRAVENOUS | Status: DC

## 2023-01-05 MED ORDER — TRAMADOL HCL 50 MG PO TABS: 50.0000 mg | ORAL_TABLET | Freq: Four times a day (QID) | ORAL | 0 refills | Status: DC | PRN

## 2023-01-05 MED ORDER — CEPHALEXIN 500 MG PO CAPS: 500.0000 mg | ORAL_CAPSULE | Freq: Four times a day (QID) | ORAL | 0 refills | Status: AC

## 2023-01-05 MED ORDER — LIDOCAINE HCL (CARDIAC) PF 100 MG/5ML IV SOSY
PREFILLED_SYRINGE | INTRAVENOUS | Status: DC | PRN
  Administered 2023-01-05: 50 mg via INTRAVENOUS

## 2023-01-05 MED ORDER — ONDANSETRON HCL 4 MG/2ML IJ SOLN
INTRAMUSCULAR | Status: AC
  Filled 2023-01-05: qty 2

## 2023-01-05 MED ORDER — CIPROFLOXACIN IN D5W 400 MG/200ML IV SOLN
400.0000 mg | INTRAVENOUS | Status: AC
  Administered 2023-01-05: 400 mg via INTRAVENOUS
  Filled 2023-01-05: qty 200

## 2023-01-05 MED ORDER — 0.9 % SODIUM CHLORIDE (POUR BTL) OPTIME
TOPICAL | Status: DC | PRN
  Administered 2023-01-05: 1000 mL

## 2023-01-05 MED ORDER — FENTANYL CITRATE (PF) 100 MCG/2ML IJ SOLN
INTRAMUSCULAR | Status: DC | PRN
  Administered 2023-01-05: 50 ug via INTRAVENOUS
  Administered 2023-01-05: 100 ug via INTRAVENOUS
  Administered 2023-01-05 (×2): 50 ug via INTRAVENOUS

## 2023-01-05 MED ORDER — LIDOCAINE HCL 1 % IJ SOLN
INTRAMUSCULAR | Status: AC
  Filled 2023-01-05: qty 20

## 2023-01-05 MED ORDER — DEXMEDETOMIDINE HCL IN NACL 80 MCG/20ML IV SOLN
INTRAVENOUS | Status: DC | PRN
  Administered 2023-01-05: 8 ug via INTRAVENOUS

## 2023-01-05 MED ORDER — OXYCODONE HCL 5 MG/5ML PO SOLN
ORAL | Status: AC
  Filled 2023-01-05: qty 5

## 2023-01-05 MED ORDER — LIDOCAINE HCL (PF) 2 % IJ SOLN
INTRAMUSCULAR | Status: AC
  Filled 2023-01-05: qty 5

## 2023-01-05 MED ORDER — ACETAMINOPHEN 500 MG PO TABS
1000.0000 mg | ORAL_TABLET | Freq: Once | ORAL | Status: AC
  Administered 2023-01-05: 1000 mg via ORAL
  Filled 2023-01-05: qty 2

## 2023-01-05 MED ORDER — ESTRADIOL 0.1 MG/GM VA CREA
TOPICAL_CREAM | VAGINAL | Status: AC
  Filled 2023-01-05: qty 42.5

## 2023-01-05 MED ORDER — CLINDAMYCIN PHOSPHATE 2 % VA CREA
TOPICAL_CREAM | VAGINAL | Status: AC
  Filled 2023-01-05: qty 40

## 2023-01-05 MED ORDER — DEXMEDETOMIDINE HCL IN NACL 80 MCG/20ML IV SOLN
INTRAVENOUS | Status: AC
  Filled 2023-01-05: qty 20

## 2023-01-05 MED ORDER — OXYCODONE HCL 5 MG/5ML PO SOLN
5.0000 mg | Freq: Once | ORAL | Status: AC | PRN
  Administered 2023-01-05: 5 mg via ORAL

## 2023-01-05 MED ORDER — PROPOFOL 500 MG/50ML IV EMUL
INTRAVENOUS | Status: DC | PRN
  Administered 2023-01-05: 10 ug/kg/min via INTRAVENOUS

## 2023-01-05 MED ORDER — MIDAZOLAM HCL 2 MG/2ML IJ SOLN
INTRAMUSCULAR | Status: AC
  Filled 2023-01-05: qty 2

## 2023-01-05 MED ORDER — STERILE WATER FOR IRRIGATION IR SOLN
Status: DC | PRN
  Administered 2023-01-05: 500 mL

## 2023-01-05 MED ORDER — ONDANSETRON HCL 4 MG/2ML IJ SOLN: 4.0000 mg | Freq: Once | INTRAMUSCULAR | Status: DC | PRN

## 2023-01-05 MED ORDER — LIDOCAINE HCL 1 % IJ SOLN
INTRAMUSCULAR | Status: DC | PRN
  Administered 2023-01-05: 10 mL

## 2023-01-05 MED ORDER — CEFAZOLIN SODIUM-DEXTROSE 2-4 GM/100ML-% IV SOLN
2.0000 g | INTRAVENOUS | Status: AC
  Administered 2023-01-05: 2 g via INTRAVENOUS
  Filled 2023-01-05: qty 100

## 2023-01-05 MED ORDER — FENTANYL CITRATE (PF) 250 MCG/5ML IJ SOLN
INTRAMUSCULAR | Status: AC
  Filled 2023-01-05: qty 5

## 2023-01-05 MED ORDER — ONDANSETRON HCL 4 MG/2ML IJ SOLN
INTRAMUSCULAR | Status: DC | PRN
  Administered 2023-01-05: 4 mg via INTRAVENOUS

## 2023-01-05 MED ORDER — DEXAMETHASONE SODIUM PHOSPHATE 10 MG/ML IJ SOLN
INTRAMUSCULAR | Status: AC
  Filled 2023-01-05: qty 1

## 2023-01-05 MED ORDER — EPHEDRINE 5 MG/ML INJ
INTRAVENOUS | Status: AC
Start: 2023-01-05 — End: ?
  Filled 2023-01-05: qty 5

## 2023-01-05 MED ORDER — DEXAMETHASONE SODIUM PHOSPHATE 10 MG/ML IJ SOLN
INTRAMUSCULAR | Status: DC | PRN
  Administered 2023-01-05: 8 mg via INTRAVENOUS

## 2023-01-05 MED ORDER — PROPOFOL 10 MG/ML IV BOLUS
INTRAVENOUS | Status: AC
  Filled 2023-01-05: qty 20

## 2023-01-05 MED ORDER — PROPOFOL 10 MG/ML IV BOLUS
INTRAVENOUS | Status: DC | PRN
  Administered 2023-01-05: 50 mg via INTRAVENOUS
  Administered 2023-01-05: 150 mg via INTRAVENOUS

## 2023-01-05 MED ORDER — OXYCODONE HCL 5 MG PO TABS: 5.0000 mg | ORAL_TABLET | Freq: Once | ORAL | Status: AC | PRN

## 2023-01-05 MED ORDER — ORAL CARE MOUTH RINSE: 15.0000 mL | Freq: Once | OROMUCOSAL | Status: AC

## 2023-01-05 MED ORDER — EPHEDRINE SULFATE (PRESSORS) 50 MG/ML IJ SOLN
INTRAMUSCULAR | Status: DC | PRN
  Administered 2023-01-05 (×2): 10 mg via INTRAVENOUS

## 2023-01-05 MED ORDER — FENTANYL CITRATE PF 50 MCG/ML IJ SOSY: 25.0000 ug | PREFILLED_SYRINGE | INTRAMUSCULAR | Status: DC | PRN

## 2023-01-05 SURGICAL SUPPLY — 31 items
BAG URINE DRAIN 2000ML AR STRL (UROLOGICAL SUPPLIES) IMPLANT
BLADE SURG 15 STRL LF DISP TIS (BLADE) ×2 IMPLANT
CATH FOLEY 2WAY SLVR 5CC 16FR (CATHETERS) ×1 IMPLANT
COVER MAYO STAND STRL (DRAPES) IMPLANT
DERMABOND ADVANCED .7 DNX12 (GAUZE/BANDAGES/DRESSINGS) IMPLANT
ELECT REM PT RETURN 15FT ADLT (MISCELLANEOUS) ×1 IMPLANT
GAUZE 4X4 16PLY ~~LOC~~+RFID DBL (SPONGE) ×2 IMPLANT
GAUZE STRIP PACKING 2INX5YD (MISCELLANEOUS) ×1 IMPLANT
GLOVE SURG LX STRL 7.5 STRW (GLOVE) ×1 IMPLANT
GOWN SRG XL LVL 4 BRTHBL STRL (GOWNS) IMPLANT
GOWN STRL REUS W/ TWL XL LVL3 (GOWN DISPOSABLE) ×1 IMPLANT
HOLDER FOLEY CATH W/STRAP (MISCELLANEOUS) IMPLANT
KIT BASIN OR (CUSTOM PROCEDURE TRAY) ×1 IMPLANT
KIT TURNOVER KIT A (KITS) IMPLANT
NDL HYPO 22X1.5 SAFETY MO (MISCELLANEOUS) IMPLANT
NEEDLE HYPO 22X1.5 SAFETY MO (MISCELLANEOUS) ×1 IMPLANT
NS IRRIG 1000ML POUR BTL (IV SOLUTION) ×1 IMPLANT
PACK CYSTO (CUSTOM PROCEDURE TRAY) ×1 IMPLANT
PACKING VAGINAL (PACKING) IMPLANT
PLUG CATH AND CAP STRL 200 (CATHETERS) ×1 IMPLANT
RETRACTOR LONRSTAR 16.6X16.6CM (MISCELLANEOUS) ×1 IMPLANT
RETRACTOR STAY HOOK 5MM (MISCELLANEOUS) ×1 IMPLANT
RETRACTOR STER APS 16.6X16.6CM (MISCELLANEOUS) ×1
SHEET LAVH (DRAPES) ×1 IMPLANT
SLING LYNX SUPRAPUBIC (Sling) ×1 IMPLANT
SLING LYNX SUPRAPUBIC BX (SLING) IMPLANT
SPIKE FLUID TRANSFER (MISCELLANEOUS) ×1 IMPLANT
SUT VIC AB 2-0 UR6 27 (SUTURE) ×1 IMPLANT
SYR 10ML LL (SYRINGE) ×1 IMPLANT
TUBING CONNECTING 10 (TUBING) ×1 IMPLANT
WATER STERILE IRR 500ML POUR (IV SOLUTION) ×1 IMPLANT

## 2023-01-05 NOTE — Transfer of Care (Signed)
Immediate Anesthesia Transfer of Care Note  Patient: Kara Ramos  Procedure(s) Performed: MID-URETHRAL SLING CYSTOSCOPY  Patient Location: PACU  Anesthesia Type:General  Level of Consciousness: awake, alert , oriented, and patient cooperative  Airway & Oxygen Therapy: Patient Spontanous Breathing and Patient connected to face mask oxygen  Post-op Assessment: Report given to RN and Post -op Vital signs reviewed and stable  Post vital signs: Reviewed and stable  Last Vitals:  Vitals Value Taken Time  BP 125/62 01/05/23 0900  Temp    Pulse 72 01/05/23 0901  Resp 9 01/05/23 0901  SpO2 100 % 01/05/23 0901  Vitals shown include unfiled device data.  Last Pain:  Vitals:   01/05/23 0533  TempSrc: Oral         Complications: No notable events documented.

## 2023-01-05 NOTE — Anesthesia Procedure Notes (Signed)
Procedure Name: LMA Insertion Date/Time: 01/05/2023 7:45 AM  Performed by: Garth Bigness, CRNAPre-anesthesia Checklist: Patient identified, Emergency Drugs available, Suction available and Patient being monitored Patient Re-evaluated:Patient Re-evaluated prior to induction Oxygen Delivery Method: Circle system utilized Preoxygenation: Pre-oxygenation with 100% oxygen Induction Type: IV induction Ventilation: Mask ventilation without difficulty LMA: LMA with gastric port inserted LMA Size: 4.0 Tube type: Oral (std 4 LMA leaked changed to LMA 4 w gastric port no leak) Number of attempts: 2 Placement Confirmation: positive ETCO2 and breath sounds checked- equal and bilateral Tube secured with: Tape Dental Injury: Teeth and Oropharynx as per pre-operative assessment

## 2023-01-05 NOTE — Op Note (Signed)
Preoperative diagnosis:  Stress urinary incontinence   Postoperative diagnosis:  same   Procedure: Mid-urethral sling cystoscopy  Surgeon: Crist Fat, MD  Anesthesia: General  Complications: None  Intraoperative findings: narrow vaginal introitus, no bladder injury noted on cystosocpy  EBL: Minimal  Specimens: None  Indication: Kara Ramos is a 72 y.o. patient with Stress urinary incontinence.  After reviewing the management options for treatment, he elected to proceed with the above surgical procedure(s). We have discussed the potential benefits and risks of the procedure, side effects of the proposed treatment, the likelihood of the patient achieving the goals of the procedure, and any potential problems that might occur during the procedure or recuperation. Informed consent has been obtained.  Description of procedure:   A 16 French Foley catheter was then placed in the patient's urethra and the bladder was drained. The Foley catheter was then capped. The horseshoe [place] retractor was then applied to the drape and the Foley catheter was attached to it. Using the skin hooks for skin nodes were placed in the corners of the labia minora to open up the vaginal vestibule. 5 cc of quarter percent Marcaine with 1% epinephrine was then injected into the periurethral tissue. The suprapubic incisions were then marked out 1 cm lateral to midline and 1 cm above the pubic bone. Using a 15 blade a stab incision was made in both sides of midline. Gauze is placed over these areas to control the skin bleeding. A 1.5 cm incision was then made in the mid urethra through the vaginal mucosa. Using this Lee scissors dissection was then carried out. Her urethra we laterally on both sides to the endopelvic fascia. The dissection was completed once there was enough space to place a finger through the incision on both sides of the urethra. Using the [facility] needle set both  needles were passed through the stab incisions suprapubically down posterior to the pubic bone and then through the periurethral incisions using the index finger to guide the needle out of the incision. Once both needles had been placed the Foley catheter was removed and cystoscopy was performed. A 70 lens was passed gently through the patient's urethra and into the bladder under visual guidance. A 360 cystoscopic evaluation was performed. There was no mucosal abnormalities or evidence of perforation from the needles. The cystoscope was then removed and the Foley catheter replaced. The bladder was then drained again and the Foley catheter. The ends of the sling were then attached to the needles and the needles pulled up through the retropubic space and out the suprapubic incision. Once the sling was noted to be centered, the blue plastic cap at the apex of the sling was cut and the plastic sheath was then pulled out. The mesh was noted to be well seated around the urethra. A right angle was used to ensure that the proper amount of tension for the sling around the urethra applied. The sling was noted to be tension free and well positioned. At this point copious amounts of double antibiotic irrigation was then used to irrigate the incision the periurethral space and the vagina. The incision was then closed with 2-0 Vicryl in a running fashion. The stab incisions in the suprapubic area were closed with Dermabond. The vagina was then packed with clindamycin impregnated vaginal packing. The patient was subsequently extubated and returned to the PACU in stable condition.  Disposition: Vaginal packing will be removed in the PACU. The patient will be sent  home with instructions to remove the Foley catheter in 24 hours. She will be given 3 days of antibiotics as well as pain medications. Followup has been scheduled for 2 weeks.

## 2023-01-05 NOTE — Interval H&P Note (Signed)
History and Physical Interval Note:  01/05/2023 7:30 AM  Kara Ramos  has presented today for surgery, with the diagnosis of STRESS URINARY INCONTINENCE.  The various methods of treatment have been discussed with the patient and family. After consideration of risks, benefits and other options for treatment, the patient has consented to  Procedure(s) with comments: MID-URETHRAL SLING (N/A) - 60 MINUTES CYSTOSCOPY (N/A) as a surgical intervention.  The patient's history has been reviewed, patient examined, no change in status, stable for surgery.  I have reviewed the patient's chart and labs.  Questions were answered to the patient's satisfaction.     Crist Fat

## 2023-01-05 NOTE — Discharge Instructions (Signed)
Post-op Instructions following Mid-urethral Sling surgery   Removal of catheter You will go home with a catheter or tube in your bladder. You will remove this the day after surgery by cutting the tube that sticks off the end of the catheter (the balloon port with numbers written on it). You can do this in the shower and allow the catheter to fall out.  Ask [place] if you have any questions about the catheter management.  Remove the foley catheter after 24 hours ( day after the procedure).can be done easily by cutting the side port of the catheter, whichallow the balloon to deflate.  You will see 1-2 teaspoons of clear water as the balloon deflates and then the catheter can be slid out without difficulty.        Cut here   Diet:  You may return to your normal diet within 24 hours following your surgery. You may note some mild nausea and possibly vomiting the first 6-8 hours following surgery. This is usually due to the side effects of anesthesia, and will disappear quite soon. I would suggest clear liquids and a very light meal the first evening following your surgery.  Activity:  Your physical activity is to be restricted, especially during the first 2 weeks home. During this time use the following guidelines:  No lifting heavy objects (anything greater than 10 pounds). No driving a car and limit long car rides. No strenuous exercise, limits stairclimbing to a minimum. Do not swim or soak in a bath tub for 2 weeks. Do not place anything per vagina for 4 weeks.  Wound care: There is very little wound care.  The suprapubic incisions (above your pubic bone) are glued closed and this will peel off over the next 7-14 days.  They do not need to be covered.   The vaginal incision may ooze/bleed a little bit the first couple days after surgery.  This is normal.  It is okay to use a sanitary pad to help keep things clean.  Otherwise this incision needs no additional care.  Bowels:  You may need a  stool softener and. A bowel movement every other day is reasonable. Use a mild laxative if needed, such as milk of magnesia 2-3 tablespoons, or 2 Dulcolax tablets. Call if you continue to have problems. If you had been taking narcotics for pain, before, during or after your surgery, you may be constipated. Take a laxative if necessary.  Medication:  You should resume your pre-surgery medications unless told not to. In addition you may be given an antibiotic to prevent or treat infection. Antibiotics are not always necessary. Pain pills (Tramadol) may also be given to help with the incision and catheter discomfort. Tylenol (acetaminophen) or Advil (ibuprofen) which have no narcotics are better if the pain is not too bad. All medication should be taken as prescribed until the bottles are finished unless you are having an unusual reaction to one of the drugs.  Problems you should report to [place]:  a. Fever greater than 101F. b. Heavy bleeding, or clots (see notes above about blood in urine). c. Inability to urinate. d. Drug reactions (hives, rash, nausea, vomiting, diarrhea). e. Severe burning or pain with urination that is not improving.

## 2023-01-05 NOTE — H&P (Signed)
72 year old female who comes today for follow-up. She was started on suppression antibiotics 3 months ago for recurring infections. She was also having some perineal pain. In addition to the suppressive antibiotics we also discussed UTI prevention strategies. The patient has done reasonably well heating all these suggestions. She has not had any problems in the last 3 months. She has made some progress with physical therapy and her groin and bladder pain.   06/09/2020: 72 year old female who presents today with recurring 1st relapsing UTIs. Over the last 3 months she has grown Klebsiella in her urine. Her symptoms include urinary frequency, gross hematuria, urgency and general suprapubic discomfort. She is currently undergoing pelvic floor physical therapy. She has been on suppressive antibiotics in the past. Her last cultures were sensitive Bactrim. She has taken all antibiotics as directed and finished the course. She does have some resistance to cephalosporins.   6/22: She had a CT scan that demonstrated no GU abnormalities. Transition to methenamine twice daily.   6/24: The patient stopped the methenamine several months prior. She was Still using the Estrace cream. She has done very well with no infections.   Urodynamics: The patient empties her bladder quite well. She has a fairly small bladder capacity. She has a normal sensation. She has no evidence of overactivity. She has mild leakage with a full bladder and a moderate leak point pressure. She has good detrusor pressure with a fairly weak stream.   Interval: Today the patient is here for follow-up after completing urodynamics. She wishes to discuss treatment for her stress urinary incontinence. She wears 1-2 pads per day. She has days where she has significant incontinence, especially with laughing coughing sneezing. She has other days where she has less leakage. For the most part though this is a bother to her. The patient does have to strain to  void, but the for the most part feels that she is emptying her bladder well. She has not had a UTI in almost 15 months.     ALLERGIES: Azithromycin Erythromycin - Hives, Diarrhea, Itching, Nausea Molnupiravir - Hives, Skin Rash, Patient stated, "This medication causes numbness around my mouth." Zithromax    MEDICATIONS: Metoprolol Succinate  Calcium + D3  Claritin  Cranberry 400 mg tablet  Nasacort  Omeprazole Daily  Premarin 0.625 mg/gram cream with applicator  Probiotic  Tamoxifen Citrate  Ventolin Hfa  Vitamin D3     GU PSH: Complex cystometrogram, w/ void pressure and urethral pressure profile studies, any technique - 11/01/2022 Complex Uroflow - 11/01/2022 Emg surf Electrd - 11/01/2022 Inject For cystogram - 11/01/2022 Intrabd voidng Press - 11/01/2022 Locm 300-399Mg /Ml Iodine,1Ml - 2022       PSH Notes: Hysterectomy (1978), cataract (2012) Lumpectomy (2020)    NON-GU PSH: Partial Remove Colon Visit Complexity (formerly GPC1X) - 08/01/2022     GU PMH: Mixed incontinence - 11/01/2022, - 2022, - 2022, - 2022, - 2021, - 2021, - 2021, - 2021, - 2021, - 2021 Stress Incontinence - 11/01/2022, - 08/01/2022, - 07/25/2021, - 2022, - 2021, - 2021, - 2017, - 2017, - 2017, - 2017 Chronic cystitis (w/o hematuria) - 07/25/2021, - 01/24/2021, - 2022, - 2022, - 2021, The patient's urinary tract infections have seemingly resolved at this point. I encouraged her to continue with cranberry tablets and a probiotic, and she should also continue with her water intake. She clearly does not need any suppressive antibiotics., - 2017 Pelvic/perineal pain - 2022, - 2022, - 2022, - 2021, - 2021, - 2021, -  2021, - 2021, - 2021, - 2021, - 2021, - 2017 Gross hematuria - 2022 Chronic cystitis (with hematuria) - 2021, - 2017      PMH Notes: Arrhythmia, h/o cervical cancer, irritable bowel syndrome   NON-GU PMH: Muscle weakness (generalized) - 2022, - 2022, - 2022, - 2021, - 2021, - 2021, - 2021, - 2021, -  2021, - 2017, - 2017, - 2017, - 2017, - 2017 Other muscle spasm - 2022, - 2022, - 2022, - 2021, - 2021, - 2021, - 2021, - 2021, - 2021, - 2017, - 2017, - 2017, - 2017, - 2017 Other specified disorders of muscle - 2022, - 2022, - 2022, - 2021, - 2021, - 2021, - 2021, - 2021, - 2021 Other lack of coordination - 2017, - 2017, - 2017 Other fecal abnormalities - 2017 Arthritis Asthma Asthma Coronary Artery Disease GERD Hyperthyroidism Noninfective gastroenteritis and colitis, unspecified Other allergic rhinitis Salmonella arthritis    FAMILY HISTORY: Arthritis - Runs In Family Asthma - Runs In Family Congestive Heart Failure - Sister Degenerative joint disease of knee - Sister, Grandfather, Brother Kidney Stones - Other macular degeneration - Sister Tuberculosis - Father   SOCIAL HISTORY: Marital Status: Widowed Preferred Language: English; Ethnicity: Not Hispanic Or Latino; Race: White Current Smoking Status: Patient does not smoke anymore. Has not smoked since 08/20/1974. Smoked less than 1/2 pack per day.  Does not use smokeless tobacco. Drinks 1 drinks per week. Social Drinker.  Drinks 3 caffeinated drinks per day. Has not had a blood transfusion. Patient's occupation Engineer, building services.    REVIEW OF SYSTEMS:    GU Review Female:   Patient denies frequent urination, hard to postpone urination, burning /pain with urination, get up at night to urinate, leakage of urine, stream starts and stops, trouble starting your stream, have to strain to urinate, and being pregnant.  Gastrointestinal (Upper):   Patient denies nausea, vomiting, and indigestion/ heartburn.  Gastrointestinal (Lower):   Patient denies diarrhea and constipation.  Constitutional:   Patient denies fever, night sweats, fatigue, and weight loss.  Skin:   Patient denies skin rash/ lesion and itching.  Eyes:   Patient denies blurred vision and double vision.  Ears/ Nose/ Throat:   Patient denies sore  throat and sinus problems.  Hematologic/Lymphatic:   Patient denies swollen glands and easy bruising.  Cardiovascular:   Patient denies leg swelling and chest pains.  Respiratory:   Patient denies cough and shortness of breath.  Endocrine:   Patient denies excessive thirst.  Musculoskeletal:   Patient denies back pain and joint pain.  Neurological:   Patient denies headaches and dizziness.  Psychologic:   Patient denies depression and anxiety.   VITAL SIGNS: None   GU PHYSICAL EXAMINATION:    External Genitalia: No hirsutism, no rash, no scarring, no cyst, no erythematous lesion, no papular lesion, no blanched lesion, no warty lesion. No edema.  Urethral Meatus: Normal size. Normal position. No discharge.  Urethra: No tenderness, no mass, no scarring. No hypermobility. No leakage.  Bladder: Normal to palpation, no tenderness, no mass, normal size.  Vagina: No atrophy, no stenosis. No rectocele. No cystocele. No enterocele.   MULTI-SYSTEM PHYSICAL EXAMINATION:    Constitutional: Well-nourished. No physical deformities. Normally developed. Good grooming.  Respiratory: Normal breath sounds. No labored breathing, no use of accessory muscles.   Cardiovascular: Regular rate and rhythm. No murmur, no gallop. Normal temperature, normal extremity pulses, no swelling, no varicosities.      Complexity of Data:  Source Of History:  Patient  Records Review:   Previous Doctor Records, Previous Patient Records, POC Tool  Urine Test Review:   Urinalysis  Urodynamics Review:   Review Urodynamics Tests   PROCEDURES:          Visit Complexity - G2211    ASSESSMENT:      ICD-10 Details  1 GU:   Stress Incontinence - N39.3    PLAN:           Schedule Return Visit/Planned Activity: ASAP - PT Referral          Document Letter(s):  Created for Patient: Clinical Summary         Notes:   I reviewed the patient's urodynamics with her. We discussed the management strategies. We discussed mid  urethral sling, a trial of physical therapy, or urethral bulking agent for her stress incontinence. Ultimately, the patient is opted to proceed with a mid urethral sling. I went through the surgery with her in detail. I explained to her that following the surgery she will have a Foley catheter for 24 hours which she will then remove at home. We discussed the risk of urinary retention as well as persistent leakage. I do think the patient should see physical therapy ahead of time, because he does have a fairly weak stream, but has a good detrusor pressure. Perhaps there is something that she can do to help with her flow. In addition, I discussed the tension of the sling, and we will err on the side of Liu so that she does not develop retention given her weak stream/flow.

## 2023-01-05 NOTE — Anesthesia Postprocedure Evaluation (Signed)
Anesthesia Post Note  Patient: Kara Ramos  Procedure(s) Performed: MID-URETHRAL SLING CYSTOSCOPY     Patient location during evaluation: PACU Anesthesia Type: General Level of consciousness: awake and alert Pain management: pain level controlled Vital Signs Assessment: post-procedure vital signs reviewed and stable Respiratory status: spontaneous breathing, nonlabored ventilation and respiratory function stable Cardiovascular status: stable and blood pressure returned to baseline Anesthetic complications: no   No notable events documented.  Last Vitals:  Vitals:   01/05/23 0945 01/05/23 0953  BP:    Pulse: 68 73  Resp:    Temp:    SpO2: 98% 98%    Last Pain:  Vitals:   01/05/23 0941  TempSrc: Temporal  PainSc:                  [name]

## 2023-01-06 ENCOUNTER — Encounter (HOSPITAL_COMMUNITY): Payer: Self-pay | Admitting: Urology

## 2023-01-06 ENCOUNTER — Emergency Department (HOSPITAL_BASED_OUTPATIENT_CLINIC_OR_DEPARTMENT_OTHER)
Admission: EM | Admit: 2023-01-06 | Discharge: 2023-01-06 | Disposition: A | Discharge status: Home / Self Care | Payer: Medicare Other | Attending: Emergency Medicine | Admitting: Emergency Medicine

## 2023-01-06 ENCOUNTER — Other Ambulatory Visit: Payer: Self-pay

## 2023-01-06 DIAGNOSIS — R339 Retention of urine, unspecified: Secondary | ICD-10-CM | POA: Insufficient documentation

## 2023-01-06 LAB — URINALYSIS, W/ REFLEX TO CULTURE (INFECTION SUSPECTED)
Bilirubin Urine: NEGATIVE
Glucose, UA: NEGATIVE mg/dL
Hgb urine dipstick: NEGATIVE
Ketones, ur: NEGATIVE mg/dL
Leukocytes,Ua: NEGATIVE
Nitrite: NEGATIVE
Protein, ur: NEGATIVE mg/dL
Specific Gravity, Urine: 1.01 (ref 1.005–1.030)
pH: 6 (ref 5.0–8.0)

## 2023-01-06 LAB — CBC WITH DIFFERENTIAL/PLATELET
Abs Immature Granulocytes: 0.03 10*3/uL (ref 0.00–0.07)
Basophils Absolute: 0 10*3/uL (ref 0.0–0.1)
Basophils Relative: 0 %
Eosinophils Absolute: 0 10*3/uL (ref 0.0–0.5)
Eosinophils Relative: 0 %
HCT: 35.1 % — ABNORMAL LOW (ref 36.0–46.0)
Hemoglobin: 11.4 g/dL — ABNORMAL LOW (ref 12.0–15.0)
Immature Granulocytes: 0 %
Lymphocytes Relative: 25 %
Lymphs Abs: 2.5 10*3/uL (ref 0.7–4.0)
MCH: 28.9 pg (ref 26.0–34.0)
MCHC: 32.5 g/dL (ref 30.0–36.0)
MCV: 88.9 fL (ref 80.0–100.0)
Monocytes Absolute: 0.6 10*3/uL (ref 0.1–1.0)
Monocytes Relative: 6 %
Neutro Abs: 6.9 10*3/uL (ref 1.7–7.7)
Neutrophils Relative %: 69 %
Platelets: 212 10*3/uL (ref 150–400)
RBC: 3.95 MIL/uL (ref 3.87–5.11)
RDW: 13.2 % (ref 11.5–15.5)
WBC: 10.1 10*3/uL (ref 4.0–10.5)
nRBC: 0 % (ref 0.0–0.2)

## 2023-01-06 LAB — BASIC METABOLIC PANEL
Anion gap: 9 (ref 5–15)
BUN: 17 mg/dL (ref 8–23)
CO2: 26 mmol/L (ref 22–32)
Calcium: 9.1 mg/dL (ref 8.9–10.3)
Chloride: 103 mmol/L (ref 98–111)
Creatinine, Ser: 0.93 mg/dL (ref 0.44–1.00)
GFR, Estimated: >60 mL/min (ref >60)
Glucose, Bld: 97 mg/dL (ref 70–99)
Potassium: 4.2 mmol/L (ref 3.5–5.1)
Sodium: 138 mmol/L (ref 135–145)

## 2023-01-06 NOTE — Discharge Instructions (Signed)
Keep Foley catheter in until you follow-up with urology.

## 2023-01-06 NOTE — ED Provider Notes (Signed)
Kara Ramos Provider Note   CSN: [id number] Arrival date & time: 01/06/23  1548     History  Chief Complaint  Patient presents with   Urinary Retention    Kara Ramos is a 72 y.o. female.  Pt here with urinary retention. Had surgery to place bladder sling yesterday. Took her foley out this morning and unable to urinate since. Sent by urology for foley placement. No n/v/d. Passing gas. No abdominal pain or fever. No other compliants.  The history is provided by the patient.       Home Medications Prior to Admission medications   Medication Sig Start Date End Date Taking? Authorizing Provider  acetaminophen (TYLENOL) 500 MG tablet Take 500 mg by mouth every 6 (six) hours as needed for moderate pain (pain score 4-6).    [provider]  albuterol (VENTOLIN HFA) 108 (90 Base) MCG/ACT inhaler Inhale 2 puffs into the lungs every 4 (four) hours as needed for wheezing or shortness of breath. 08/30/21   Ellamae Sia, DO  Ascorbic Acid (VITAMIN C) 500 MG CAPS Take 500 mg by mouth daily. 08/12/20   [provider]  Calcium Carb-Cholecalciferol (CALCIUM 500 + D PO) Take 1 tablet by mouth daily.    [provider]  calcium elemental as carbonate (TUMS ULTRA 1000) 400 MG chewable tablet Chew 2,000 mg by mouth daily as needed for heartburn.    [provider]  cephALEXin (KEFLEX) 500 MG capsule Take 1 capsule (500 mg total) by mouth 4 (four) times daily for 3 days. 01/05/23 01/08/23  Crist Fat, MD  Cholecalciferol (VITAMIN D3 ULTRA STRENGTH) 125 MCG (5000 UT) capsule Take 5,000 Units by mouth daily.    [provider]  CRANBERRY PO Take 30,000 mg by mouth 2 (two) times daily.    [provider]  desonide (DESOWEN) 0.05 % cream Apply 1 Application topically 2 (two) times daily as needed (dandruff).    [provider]  fluticasone (FLONASE) 50 MCG/ACT nasal spray Place 1 spray  into both nostrils daily as needed for allergies or rhinitis.    [provider]  hydrocortisone (ANUSOL-HC) 2.5 % rectal cream Place 1 application. rectally at bedtime as needed for hemorrhoids or anal itching. 06/10/21   Hilarie Fredrickson, MD  loratadine (CLARITIN) 10 MG tablet Take 10 mg by mouth daily.    [provider]  Methylcellulose, Laxative, (CITRUCEL PO) Take 1 Scoop by mouth daily.    [provider]  metoprolol succinate (TOPROL-XL) 25 MG 24 hr tablet TAKE 1 TABLET BY MOUTH EVERY DAY 02/11/19   Patwardhan, Anabel Bene, MD  omeprazole (PRILOSEC) 20 MG capsule Take 1 capsule by mouth daily before breakfast. 05/20/20   [provider]  PREMARIN vaginal cream Place 1 applicator vaginally daily as needed (irritation/dryness). 02/09/20   [provider]  Probiotic Product (PROBIOTIC PO) Take 1 tablet by mouth 2 (two) times daily.    [provider]  tamoxifen (NOLVADEX) 20 MG tablet TAKE 1 TABLET(20 MG) BY MOUTH DAILY 01/24/22   Serena Croissant, MD  traMADol (ULTRAM) 50 MG tablet Take 1-2 tablets (50-100 mg total) by mouth every 6 (six) hours as needed for moderate pain (pain score 4-6). 01/05/23   Crist Fat, MD      Allergies    Azithromycin, Molnupiravir, Bactrim [sulfamethoxazole-trimethoprim], Doxycycline, Erythromycin, Tape, and Tessalon [benzonatate]    Review of Systems   Review of Systems  Physical Exam Updated  Vital Signs BP 139/61 (BP Location: Left Arm)   Pulse 64   Temp 98.9 F (37.2 C) (Oral)   Resp 18   Ht 5\' 2"  (1.575 m)   Wt 79.4 kg   SpO2 100%   BMI 32.01 kg/m  Physical Exam Vitals and nursing note reviewed.  Constitutional:      General: She is not in acute distress.    Appearance: She is well-developed. She is not ill-appearing.  HENT:     Head: Normocephalic and atraumatic.     Mouth/Throat:     Mouth: Mucous membranes are moist.  Eyes:     Conjunctiva/sclera: Conjunctivae normal.     Pupils:  Pupils are equal, round, and reactive to light.  Cardiovascular:     Rate and Rhythm: Normal rate and regular rhythm.     Heart sounds: No murmur heard. Pulmonary:     Effort: Pulmonary effort is normal. No respiratory distress.     Breath sounds: Normal breath sounds.  Abdominal:     General: Abdomen is flat.     Palpations: Abdomen is soft.     Tenderness: There is no abdominal tenderness.  Musculoskeletal:        General: No swelling.     Cervical back: Normal range of motion and neck supple.  Skin:    General: Skin is warm and dry.     Capillary Refill: Capillary refill takes less than 2 seconds.  Neurological:     Mental Status: She is alert.  Psychiatric:        Mood and Affect: Mood normal.     ED Results / Procedures / Treatments   Labs (all labs ordered are listed, but only abnormal results are displayed) Labs Reviewed  CBC WITH DIFFERENTIAL/PLATELET - Abnormal; Notable for the following components:      Result Value   Hemoglobin 11.4 (*)    HCT 35.1 (*)    All other components within normal limits  URINALYSIS, W/ REFLEX TO CULTURE (INFECTION SUSPECTED) - Abnormal; Notable for the following components:   Bacteria, UA RARE (*)    All other components within normal limits  BASIC METABOLIC PANEL    EKG None  Radiology No results found.  Procedures Procedures    Medications Ordered in ED Medications - No data to display  ED Course/ Medical Decision Making/ A&P                                 Medical Decision Making  [name] is here for urinary retention.  She had bladder sling placed yesterday.  Removed her Foley catheter this morning but has failed a trial of void.  It has been about 8 hours since she has made any urine.  She does not have any discomfort.  She is passing gas.  No nausea vomiting or diarrhea.  Lab work was unremarkable.  Urinalysis negative for infection.  I talked with Dr. Mena Goes and he was okay with [place] placing a Foley  catheter which was done.  Patient is feeling much better.  Discharged.  Will follow-up with urology for catheter removal early next week.  Discharged in good condition.  This chart was dictated using voice recognition software.  Despite best efforts to proofread,  errors can occur which can change the documentation meaning.         Final Clinical Impression(s) / ED Diagnoses Final diagnoses:  Urinary retention  Rx / DC Orders ED Discharge Orders     None         Virgina Norfolk, DO 01/06/23 1644

## 2023-01-06 NOTE — ED Triage Notes (Signed)
Had bladder sling surgery yesterday. Foley cath was removed post op this morning around 8 am and patient has not urinated since. Endorses lower abdominal "discomfort"

## 2023-01-08 DIAGNOSIS — R8279 Other abnormal findings on microbiological examination of urine: Secondary | ICD-10-CM | POA: Diagnosis not present

## 2023-01-10 ENCOUNTER — Other Ambulatory Visit: Payer: Self-pay | Admitting: Hematology and Oncology

## 2023-01-12 ENCOUNTER — Encounter (HOSPITAL_COMMUNITY): Payer: Self-pay | Admitting: Urology

## 2023-01-14 DIAGNOSIS — R399 Unspecified symptoms and signs involving the genitourinary system: Secondary | ICD-10-CM | POA: Diagnosis not present

## 2023-01-14 DIAGNOSIS — N3000 Acute cystitis without hematuria: Secondary | ICD-10-CM | POA: Diagnosis not present

## 2023-01-15 DIAGNOSIS — H5213 Myopia, bilateral: Secondary | ICD-10-CM | POA: Diagnosis not present

## 2023-01-15 DIAGNOSIS — Z0100 Encounter for examination of eyes and vision without abnormal findings: Secondary | ICD-10-CM | POA: Diagnosis not present

## 2023-01-23 DIAGNOSIS — R338 Other retention of urine: Secondary | ICD-10-CM | POA: Diagnosis not present

## 2023-01-24 DIAGNOSIS — R339 Retention of urine, unspecified: Secondary | ICD-10-CM | POA: Diagnosis not present

## 2023-01-25 DIAGNOSIS — K080 Exfoliation of teeth due to systemic causes: Secondary | ICD-10-CM | POA: Diagnosis not present

## 2023-02-01 LAB — GENECONNECT MOLECULAR SCREEN: Genetic Analysis Overall Interpretation: POSITIVE — AB

## 2023-02-02 ENCOUNTER — Telehealth: Payer: Self-pay | Admitting: Medical Genetics

## 2023-02-02 DIAGNOSIS — Z1501 Genetic susceptibility to malignant neoplasm of breast: Secondary | ICD-10-CM

## 2023-02-02 NOTE — Telephone Encounter (Signed)
[place] [facility] 02/02/2023 @ 10:23 AM  Genetic counseling was offered and participant is scheduled. All questions were answered, and participant was thanked for their time and support of the above study. Participant was encouraged to contact [facility] if they have any further questions or concerns.    Newman Nip, BS [facility]  [employer] Clinical Research Specialist II Direct Dial: [phone]  Fax: [phone] Website: [facility]  [employer]  [facility]

## 2023-02-02 NOTE — Telephone Encounter (Signed)
[place] [facility] 02/02/2023 9:50 AM  FIRST ATTEMPT: Confirmed I was speaking with Kara Ramos [id number] by using name and DOB. Informed participant the reason for this call is to provide results for the above study. Results revealed Hereditary Breast and Ovarian Syndrome. Genetic counseling was offered and participant requests a call back to schedule. All questions were answered, and participant was thanked for their time and support of the above study. Participant was encouraged to contact [facility] if they have any further questions or concerns.

## 2023-02-06 DIAGNOSIS — R338 Other retention of urine: Secondary | ICD-10-CM | POA: Diagnosis not present

## 2023-02-06 DIAGNOSIS — N393 Stress incontinence (female) (male): Secondary | ICD-10-CM | POA: Diagnosis not present

## 2023-02-14 DIAGNOSIS — R339 Retention of urine, unspecified: Secondary | ICD-10-CM | POA: Diagnosis not present

## 2023-02-19 ENCOUNTER — Encounter: Payer: Self-pay | Admitting: Physical Therapy

## 2023-02-19 ENCOUNTER — Other Ambulatory Visit: Payer: Self-pay

## 2023-02-19 ENCOUNTER — Ambulatory Visit: Payer: Medicare Other | Attending: Urology | Admitting: Physical Therapy

## 2023-02-19 DIAGNOSIS — R339 Retention of urine, unspecified: Secondary | ICD-10-CM | POA: Diagnosis not present

## 2023-02-19 DIAGNOSIS — R278 Other lack of coordination: Secondary | ICD-10-CM | POA: Diagnosis not present

## 2023-02-19 DIAGNOSIS — M62838 Other muscle spasm: Secondary | ICD-10-CM | POA: Diagnosis not present

## 2023-02-19 NOTE — Therapy (Signed)
 OUTPATIENT PHYSICAL THERAPY FEMALE PELVIC EVALUATION   Patient Name: Kara Ramos MRN: [id number] DOB:05/17/1950, 73 y.o., female Today's Date: 02/19/2023  END OF SESSION:  PT End of Session - 02/19/23 1552     Visit Number 1    Date for PT Re-Evaluation 05/14/23    Authorization Type [id number]  NO AUTHOR REQ  Medical necessity  $10 COPAY  OOP:3,500  blue-e 02/19/2023-km    PT Start Time 1400    PT Stop Time 1438    PT Time Calculation (min) 38 min    Activity Tolerance Patient tolerated treatment well    Behavior During Therapy WFL for tasks assessed/performed             Past Medical History:  Diagnosis Date   Allergy     Arthritis    Asthma    Breast CA (HCC)    Breast cancer (HCC)    Cancer (HCC) 1978   cervical- had hysterectomy   Cataract    Removed both eyes   Complication of anesthesia    per patient 'one time was aware and awake during surgery and also BP can get really low   GERD (gastroesophageal reflux disease)    History of IBS    Irregular heart rhythm    occasional PAC'S, PVC's   Osteopenia    PAC (premature atrial contraction)    hx of   Personal history of radiation therapy    Past Surgical History:  Procedure Laterality Date   ABDOMINAL HYSTERECTOMY     both ovaries removed   APPENDECTOMY     BREAST LUMPECTOMY     BREAST LUMPECTOMY WITH RADIOACTIVE SEED LOCALIZATION Right 12/24/2018   Procedure: RIGHT BREAST LUMPECTOMY WITH RADIOACTIVE SEED LOCALIZATION;  Surgeon: Aron Shoulders, MD;  Location: [facility];  Service: General;  Laterality: Right;   BUNIONECTOMY     both feet   CATARACT EXTRACTION     right   COLON SURGERY  1988   part of colon removed d/t endometriosis   COLONOSCOPY  05/2017   One benign polyp   CORRECTION HAMMER TOE  03/2017   CYSTOSCOPY N/A 01/05/2023   Procedure: CYSTOSCOPY;  Surgeon: Cam Morene ORN, MD;  Location: [facility];  Service: Urology;  Laterality: N/A;   PUBOVAGINAL SLING N/A 01/05/2023   Procedure:  MID-URETHRAL SLING;  Surgeon: Cam Morene ORN, MD;  Location: [facility];  Service: Urology;  Laterality: N/A;  60 MINUTES   Patient Active Problem List   Diagnosis Date Noted   Pulmonary nodule 08/11/2020   Essential hypertension 03/23/2019   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   Adverse food reaction 10/23/2018   Mild intermittent asthma without complication 10/23/2018   Other allergic rhinitis 10/23/2018   PVC (premature ventricular contraction) 03/21/2018   Decreased estrogen level 04/04/2017   Menopausal problem 04/04/2017   Menopausal syndrome 04/04/2017   Osteopenia 04/04/2017   Vaginal irritation 04/04/2017   Irritable bowel syndrome 02/14/1968    PCP: Katina Pfeiffer, PA-C  REFERRING PROVIDER: Cam Morene ORN, MD  REFERRING DIAG: R33.9 (ICD-10-CM) - Urinary retention  THERAPY DIAG:  Other lack of coordination  Other muscle spasm  Rationale for Evaluation and Treatment: Rehabilitation  ONSET DATE: 10/22  SUBJECTIVE:  SUBJECTIVE STATEMENT: Pt reports that she is frustrated. She is having to self cath, The only time she can pee some is when she is having a BM  when she is bearing down. Has had 2 UTIs since Sx. Has a follow up in a week Was sent home with a catheter after a sx. Surgery was November 22. Went to ER for urinary retention a day after sx because she could not pee. Taking Flomax Dr Cam is concerned that if he loosens the sling, she won't have much control.   Pt reports that she feels pressure in her bladder after 2 cups of coffee, and in the middle of the night. Cathes 5-6 times/ day  Fluid intake: yes   PAIN:  Are you having pain? No NPRS scale: 4/10 1st layer with palapation Pain location: Internal  Pain type: burning Pain  description: intermittent   Aggravating factors: manual pressure Relieving factors: no manual pressure  PRECAUTIONS: None  RED FLAGS: None   WEIGHT BEARING RESTRICTIONS: No  FALLS:  Has patient fallen in last 6 months? No  LIVING ENVIRONMENT: Lives with: lives with their family and lives alone Lives in: House/apartment Stairs: No Has following equipment at home: None  OCCUPATION: retired  PLOF: Independent  PATIENT GOALS: to be able to urinate and not leak  PERTINENT HISTORY:  Mid urethral sling 01/05/2023 Sexual abuse: No  BOWEL MOVEMENT: no issues   URINATION: Pain with urination: No Fully empty bladder: No Stream: Weak- unable to- has to cath Urgency: Yes:   Frequency: 5-6 times/ day - cathes Leakage:  no Pads: No  INTERCOURSE:n/a  PREGNANCY:   PROLAPSE: None   OBJECTIVE:  Note: Objective measures were completed at Evaluation unless otherwise noted.   COGNITION: Overall cognitive status: Within functional limits for tasks assessed     SENSATION: Light touch: Appears intact Proprioception: Appears intact  MUSCLE LENGTH: Hamstrings: Right 80 deg; Left 80 deg  LUMBAR SPECIAL TESTS:  FABER test: Negative     POSTURE: rounded shoulders, forward head, decreased lumbar lordosis, and flexed trunk   PELVIC ALIGNMENT:even  LUMBARAROM/PROM: slightly limited all planes   LOWER EXTREMITY ROM: bilateral hips IR limited   LOWER EXTREMITY MMT: grossly WFL PALPATION:   General  within functional limitations                 External Perineal Exam within functional limitations, some redness and swelling present                             Internal Pelvic Floor tight and tender layer 1  Patient confirms identification and approves PT to assess internal pelvic floor and treatment Yes  PELVIC MMT:   MMT eval  Vaginal 4/5  Internal Anal Sphincter   External Anal Sphincter   Puborectalis   Diastasis Recti   (Blank rows = not tested)         TONE: Healed lower abdominal scars High tone pelvic floor- gripping, does not lengthen with diaphragmatic breathing  Large older vertical abdominal scar Upper hest breathing  PROLAPSE: no  TODAY'S TREATMENT:  DATE: 02/19/23   EVAL see below   PATIENT EDUCATION:  - ther acts:  Education details: stimulating urge- see below, manual therapy, exam findings, HEP Person educated: Patient Education method: Explanation, Demonstration, Tactile cues, Verbal cues, and Handouts Education comprehension: verbalized understanding, returned demonstration, verbal cues required, tactile cues required, and needs further education  HOME EXERCISE PROGRAM: [id number]  ASSESSMENT:  CLINICAL IMPRESSION: Patient is a 73 y.o. F who was seen today for physical therapy evaluation and treatment for urinary retention. She presents with a lot of tenderness internally, pelvic floor layer 1 and with difficulty lengthening, holding tension. Pt to try to do some manual release at home for HEP.  Discussed  cold and warm compresses supra pubically, trying listening to running water , feet in a warm bath, pelvic floor down training, adding some sparkling water  to diet and trying to urinate before she self caths on her squatty potty. Pt will benefit from PT to improve pelvic floor and bladder function and return to normal urination   OBJECTIVE IMPAIRMENTS: decreased coordination, decreased ROM, increased edema, increased fascial restrictions, increased muscle spasms, impaired flexibility, and pain.   ACTIVITY LIMITATIONS: continence and toileting  PARTICIPATION LIMITATIONS: community activity  PERSONAL FACTORS: Age and Time since onset of injury/illness/exacerbation are also affecting patient's functional outcome.   REHAB POTENTIAL: Good  CLINICAL DECISION MAKING:  Stable/uncomplicated  EVALUATION COMPLEXITY: Low   GOALS: Goals reviewed with patient? Yes  SHORT TERM GOALS: Target date: 03/19/2023    Pt will be I with her initial HEP Baseline: Goal status: INITIAL  2.  Pt will report reducing need to cath by 50% Baseline:  Goal status: INITIAL  3.  Pt will be I with manual pelvic floor release Baseline:  Goal status: INITIAL    LONG TERM GOALS: Target date: 05/14/2023    Pt will be I with her advanced HEP Baseline:  Goal status: INITIAL  2.  Pt will have 0/10 pain in her pelvic floor muscles Baseline:  Goal status: INITIAL  3.  Pt will be able to urinate as needed Baseline:  Goal status: INITIAL  4.  Pt will not leak with coughing, sneezing and laughing Baseline:  Goal status: INITIAL  5.  Pt will report 0 urinary urgency Baseline:  Goal status: INITIAL    PLAN:  PT FREQUENCY: 1-2x/week  PT DURATION: 12 weeks  PLANNED INTERVENTIONS: 97110-Therapeutic exercises, 97530- Therapeutic activity, 97112- Neuromuscular re-education, 97535- Self Care, [id number]- Manual therapy, Dry Needling, Joint mobilization, Joint manipulation, Spinal manipulation, Spinal mobilization, Scar mobilization, and Biofeedback  PLAN FOR NEXT SESSION: cont down training   Chrishana Spargur, PT 02/19/23 3:59 PM

## 2023-02-20 ENCOUNTER — Ambulatory Visit
Admission: RE | Admit: 2023-02-20 | Discharge: 2023-02-20 | Disposition: A | Discharge status: Home / Self Care | Payer: Medicare Other | Source: Ambulatory Visit | Attending: Emergency Medicine | Admitting: Emergency Medicine

## 2023-02-20 DIAGNOSIS — R918 Other nonspecific abnormal finding of lung field: Secondary | ICD-10-CM | POA: Diagnosis not present

## 2023-02-20 DIAGNOSIS — R911 Solitary pulmonary nodule: Secondary | ICD-10-CM

## 2023-02-26 DIAGNOSIS — R338 Other retention of urine: Secondary | ICD-10-CM | POA: Diagnosis not present

## 2023-02-27 ENCOUNTER — Ambulatory Visit: Payer: Medicare Other | Admitting: Physical Therapy

## 2023-03-06 ENCOUNTER — Ambulatory Visit: Payer: Medicare Other | Admitting: Physical Therapy

## 2023-03-06 ENCOUNTER — Encounter: Payer: Self-pay | Admitting: Physical Therapy

## 2023-03-06 DIAGNOSIS — R339 Retention of urine, unspecified: Secondary | ICD-10-CM | POA: Diagnosis not present

## 2023-03-06 DIAGNOSIS — R278 Other lack of coordination: Secondary | ICD-10-CM

## 2023-03-06 DIAGNOSIS — M62838 Other muscle spasm: Secondary | ICD-10-CM | POA: Diagnosis not present

## 2023-03-06 NOTE — Therapy (Signed)
OUTPATIENT PHYSICAL THERAPY FEMALE PELVIC TREATMENT   Patient Name: Kara Ramos MRN: [id number] DOB:05/16/1950, 73 y.o., female Today's Date: 03/06/2023  END OF SESSION:  PT End of Session - 03/06/23 1633     Visit Number 2    Date for PT Re-Evaluation 05/14/23    Authorization Type [id number]-2025  NO AUTHOR REQ  Medical necessity  $10 COPAY  OOP:3,500  blue-e 02/19/2023-km    PT Start Time 1445    PT Stop Time 1530    PT Time Calculation (min) 45 min    Activity Tolerance Patient tolerated treatment well    Behavior During Therapy WFL for tasks assessed/performed              Past Medical History:  Diagnosis Date   Allergy    Arthritis    Asthma    Breast CA (HCC)    Breast cancer (HCC)    Cancer (HCC) 1978   cervical- had hysterectomy   Cataract    Removed both eyes   Complication of anesthesia    per patient 'one time was aware and awake during surgery and also BP can get really low"   GERD (gastroesophageal reflux disease)    History of IBS    Irregular heart rhythm    occasional PAC'S, PVC's   Osteopenia    PAC (premature atrial contraction)    hx of   Personal history of radiation therapy    Past Surgical History:  Procedure Laterality Date   ABDOMINAL HYSTERECTOMY     both ovaries removed   APPENDECTOMY     BREAST LUMPECTOMY     BREAST LUMPECTOMY WITH RADIOACTIVE SEED LOCALIZATION Right 12/24/2018   Procedure: RIGHT BREAST LUMPECTOMY WITH RADIOACTIVE SEED LOCALIZATION;  Surgeon: Almond Lint, MD;  Location: [facility];  Service: General;  Laterality: Right;   BUNIONECTOMY     both feet   CATARACT EXTRACTION     right   COLON SURGERY  1988   part of colon removed d/t endometriosis   COLONOSCOPY  05/2017   One benign polyp   CORRECTION HAMMER TOE  03/2017   CYSTOSCOPY N/A 01/05/2023   Procedure: CYSTOSCOPY;  Surgeon: Crist Fat, MD;  Location: [facility];  Service: Urology;  Laterality: N/A;   PUBOVAGINAL SLING N/A 01/05/2023   Procedure:  MID-URETHRAL SLING;  Surgeon: Crist Fat, MD;  Location: [facility];  Service: Urology;  Laterality: N/A;  60 MINUTES   Patient Active Problem List   Diagnosis Date Noted   Pulmonary nodule 08/11/2020   Essential hypertension 03/23/2019   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   Adverse food reaction 10/23/2018   Mild intermittent asthma without complication 10/23/2018   Other allergic rhinitis 10/23/2018   PVC (premature ventricular contraction) 03/21/2018   Decreased estrogen level 04/04/2017   Menopausal problem 04/04/2017   Menopausal syndrome 04/04/2017   Osteopenia 04/04/2017   Vaginal irritation 04/04/2017   Irritable bowel syndrome 02/14/1968    PCP: Jarrett Soho, PA-C  REFERRING PROVIDER: Crist Fat, MD  REFERRING DIAG: R33.9 (ICD-10-CM) - Urinary retention  THERAPY DIAG:  Other lack of coordination  Other muscle spasm  Rationale for Evaluation and Treatment: Rehabilitation  ONSET DATE: 10/22  SUBJECTIVE:  SUBJECTIVE STATEMENT: Pt reports that she thought she was making progress, she was able to pee a little. She saw her urologist last week. She said he has not seen much retention- post surgeries- only 4 times in 20 years She gets the urge to pee. She just caths. She does not want to stretch it. Is afraid.   Fluid intake: yes   PAIN:  Are you having pain? No NPRS scale: 4/10 1st layer with palapation Pain location: Internal  Pain type: burning Pain description: intermittent   Aggravating factors: manual pressure Relieving factors: no manual pressure  PRECAUTIONS: None  RED FLAGS: None   WEIGHT BEARING RESTRICTIONS: No  FALLS:  Has patient fallen in last 6 months? No  LIVING ENVIRONMENT: Lives with: lives with their family  and lives alone Lives in: House/apartment Stairs: No Has following equipment at home: None  OCCUPATION: retired  PLOF: Independent  PATIENT GOALS: to be able to urinate and not leak  PERTINENT HISTORY:  Mid urethral sling 01/05/2023 Sexual abuse: No  BOWEL MOVEMENT: no issues   URINATION: Pain with urination: No Fully empty bladder: No Stream: Weak- unable to- has to cath Urgency: Yes:   Frequency: 5-6 times/ day - cathes Leakage:  no Pads: No  INTERCOURSE:n/a  PREGNANCY:   PROLAPSE: None   OBJECTIVE:  Note: Objective measures were completed at Evaluation unless otherwise noted.   COGNITION: Overall cognitive status: Within functional limits for tasks assessed     SENSATION: Light touch: Appears intact Proprioception: Appears intact  MUSCLE LENGTH: Hamstrings: Right 80 deg; Left 80 deg  LUMBAR SPECIAL TESTS:  FABER test: Negative     POSTURE: rounded shoulders, forward head, decreased lumbar lordosis, and flexed trunk   PELVIC ALIGNMENT:even  LUMBARAROM/PROM: slightly limited all planes   LOWER EXTREMITY ROM: bilateral hips IR limited   LOWER EXTREMITY MMT: grossly WFL PALPATION:   General  within functional limitations                 External Perineal Exam within functional limitations, some redness and swelling present                             Internal Pelvic Floor tight and tender layer 1  Patient confirms identification and approves PT to assess internal pelvic floor and treatment Yes  PELVIC MMT:   MMT eval  Vaginal 4/5  Internal Anal Sphincter   External Anal Sphincter   Puborectalis   Diastasis Recti   (Blank rows = not tested)        TONE: Healed lower abdominal scars High tone pelvic floor- gripping, does not lengthen with diaphragmatic breathing  Large older vertical abdominal scar Upper hest breathing  PROLAPSE: no  TODAY'S TREATMENT:  DATE: 03/06/23   EVAL see below   Manual- pelvic floor muscle release with bilateral knee fallout, bladder mobs    Neuro reed- diaphragmatic breathing, reverse kegel    Therapeutic exercises-    Therapeutic activities-  education on HEP, irritants, tissue healing    HOME EXERCISE PROGRAM: [id number]  ASSESSMENT:  CLINICAL IMPRESSION: Pt with much less tenderness pelvic floor layer one and two.  Discussed taking more time to try to pee ( pt reported that she does not have much patience). She will try Flomax at night, bladder tapping and listening to urge, trying to pee every time before cathing, pt to try soda to have more urgency temporarily. She will benefit from PT to improve pelvic floor relaxation and reduce need for cathing.      OBJECTIVE IMPAIRMENTS: decreased coordination, decreased ROM, increased edema, increased fascial restrictions, increased muscle spasms, impaired flexibility, and pain.   ACTIVITY LIMITATIONS: continence and toileting  PARTICIPATION LIMITATIONS: community activity  PERSONAL FACTORS: Age and Time since onset of injury/illness/exacerbation are also affecting patient's functional outcome.   REHAB POTENTIAL: Good  CLINICAL DECISION MAKING: Stable/uncomplicated  EVALUATION COMPLEXITY: Low   GOALS: Goals reviewed with patient? Yes  SHORT TERM GOALS: Target date: 03/19/2023    Pt will be I with her initial HEP Baseline: Goal status: INITIAL  2.  Pt will report reducing need to cath by 50% Baseline:  Goal status: INITIAL  3.  Pt will be I with manual pelvic floor release Baseline:  Goal status: INITIAL    LONG TERM GOALS: Target date: 05/14/2023    Pt will be I with her advanced HEP Baseline:  Goal status: INITIAL  2.  Pt will have 0/10 pain in her pelvic floor muscles Baseline:  Goal status: INITIAL  3.  Pt will be able to urinate as needed Baseline:  Goal status:  INITIAL  4.  Pt will not leak with coughing, sneezing and laughing Baseline:  Goal status: INITIAL  5.  Pt will report 0 urinary urgency Baseline:  Goal status: INITIAL    PLAN:  PT FREQUENCY: 1-2x/week  PT DURATION: 12 weeks  PLANNED INTERVENTIONS: 97110-Therapeutic exercises, 97530- Therapeutic activity, 97112- Neuromuscular re-education, 97535- Self Care, [id number]- Manual therapy, Dry Needling, Joint mobilization, Joint manipulation, Spinal manipulation, Spinal mobilization, Scar mobilization, and Biofeedback  PLAN FOR NEXT SESSION: cont down training   Euna Armon, PT 03/06/23 4:35 PM

## 2023-03-08 ENCOUNTER — Ambulatory Visit: Payer: Medicare Other | Admitting: Emergency Medicine

## 2023-03-13 ENCOUNTER — Ambulatory Visit: Payer: Medicare Other | Admitting: Physical Therapy

## 2023-03-13 ENCOUNTER — Encounter: Payer: Self-pay | Admitting: Physical Therapy

## 2023-03-13 DIAGNOSIS — R278 Other lack of coordination: Secondary | ICD-10-CM

## 2023-03-13 DIAGNOSIS — M62838 Other muscle spasm: Secondary | ICD-10-CM

## 2023-03-13 DIAGNOSIS — R339 Retention of urine, unspecified: Secondary | ICD-10-CM | POA: Diagnosis not present

## 2023-03-13 NOTE — Therapy (Signed)
OUTPATIENT PHYSICAL THERAPY FEMALE PELVIC TREATMENT   Patient Name: Kara Ramos MRN: [id number] DOB:[birth year], 73 y.o., female Today's Date: 03/13/2023  END OF SESSION:  PT End of Session - 03/13/23 1007     Visit Number 3    Date for PT Re-Evaluation 05/14/23    Authorization Type BCBS-2025  NO AUTHOR REQ  Medical necessity  $10 COPAY  OOP:3,500  blue-e 02/19/2023-km    PT Start Time 0930    PT Stop Time 1010    PT Time Calculation (min) 40 min    Activity Tolerance Patient tolerated treatment well    Behavior During Therapy WFL for tasks assessed/performed               Past Medical History:  Diagnosis Date   Allergy    Arthritis    Asthma    Breast CA (HCC)    Breast cancer (HCC)    Cancer (HCC) 1978   cervical- had hysterectomy   Cataract    Removed both eyes   Complication of anesthesia    per patient 'one time was aware and awake during surgery and also BP can get really low"   GERD (gastroesophageal reflux disease)    History of IBS    Irregular heart rhythm    occasional PAC'S, PVC's   Osteopenia    PAC (premature atrial contraction)    hx of   Personal history of radiation therapy    Past Surgical History:  Procedure Laterality Date   ABDOMINAL HYSTERECTOMY     both ovaries removed   APPENDECTOMY     BREAST LUMPECTOMY     BREAST LUMPECTOMY WITH RADIOACTIVE SEED LOCALIZATION Right 12/24/2018   Procedure: RIGHT BREAST LUMPECTOMY WITH RADIOACTIVE SEED LOCALIZATION;  Surgeon: Almond Lint, MD;  Location: [facility];  Service: General;  Laterality: Right;   BUNIONECTOMY     both feet   CATARACT EXTRACTION     right   COLON SURGERY  1988   part of colon removed d/t endometriosis   COLONOSCOPY  05/2017   One benign polyp   CORRECTION HAMMER TOE  03/2017   CYSTOSCOPY N/A 01/05/2023   Procedure: CYSTOSCOPY;  Surgeon: Crist Fat, MD;  Location: [facility];  Service: Urology;  Laterality: N/A;   PUBOVAGINAL SLING N/A 01/05/2023   Procedure:  MID-URETHRAL SLING;  Surgeon: Crist Fat, MD;  Location: [facility];  Service: Urology;  Laterality: N/A;  60 MINUTES   Patient Active Problem List   Diagnosis Date Noted   Pulmonary nodule 08/11/2020   Essential hypertension 03/23/2019   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   Adverse food reaction 10/23/2018   Mild intermittent asthma without complication 10/23/2018   Other allergic rhinitis 10/23/2018   PVC (premature ventricular contraction) 03/21/2018   Decreased estrogen level 04/04/2017   Menopausal problem 04/04/2017   Menopausal syndrome 04/04/2017   Osteopenia 04/04/2017   Vaginal irritation 04/04/2017   Irritable bowel syndrome 02/14/1968    PCP: Jarrett Soho, PA-C  REFERRING PROVIDER: Crist Fat, MD  REFERRING DIAG: R33.9 (ICD-10-CM) - Urinary retention  THERAPY DIAG:  Other lack of coordination  Other muscle spasm  Rationale for Evaluation and Treatment: Rehabilitation  ONSET DATE: 10/22  SUBJECTIVE:  SUBJECTIVE STATEMENT: Pt reports that she peed a little on her own. She was pleased.  Felt like she had better success after her exercises. Had to cath a little. A little constipated this morning. She was sedentary for a few days.   Fluid intake: yes   PAIN:  Are you having pain? No NPRS scale: 4/10 1st layer with palapation Pain location: Internal  Pain type: burning Pain description: intermittent   Aggravating factors: manual pressure Relieving factors: no manual pressure  PRECAUTIONS: None  RED FLAGS: None   WEIGHT BEARING RESTRICTIONS: No  FALLS:  Has patient fallen in last 6 months? No  LIVING ENVIRONMENT: Lives with: lives with their family and lives alone Lives in: House/apartment Stairs: No Has following  equipment at home: None  OCCUPATION: retired  PLOF: Independent  PATIENT GOALS: to be able to urinate and not leak  PERTINENT HISTORY:  Mid urethral sling 01/05/2023 Sexual abuse: No  BOWEL MOVEMENT: no issues   URINATION: Pain with urination: No Fully empty bladder: No Stream: Weak- unable to- has to cath Urgency: Yes:   Frequency: 5-6 times/ day - cathes Leakage:  no Pads: No  INTERCOURSE:n/a  PREGNANCY:   PROLAPSE: None   OBJECTIVE:  Note: Objective measures were completed at Evaluation unless otherwise noted.   COGNITION: Overall cognitive status: Within functional limits for tasks assessed     SENSATION: Light touch: Appears intact Proprioception: Appears intact  MUSCLE LENGTH: Hamstrings: Right 80 deg; Left 80 deg  LUMBAR SPECIAL TESTS:  FABER test: Negative     POSTURE: rounded shoulders, forward head, decreased lumbar lordosis, and flexed trunk   PELVIC ALIGNMENT:even  LUMBARAROM/PROM: slightly limited all planes   LOWER EXTREMITY ROM: bilateral hips IR limited   LOWER EXTREMITY MMT: grossly WFL PALPATION:   General  within functional limitations                 External Perineal Exam within functional limitations, some redness and swelling present                             Internal Pelvic Floor tight and tender layer 1  Patient confirms identification and approves PT to assess internal pelvic floor and treatment Yes  PELVIC MMT:   MMT eval  Vaginal 4/5  Internal Anal Sphincter   External Anal Sphincter   Puborectalis   Diastasis Recti   (Blank rows = not tested)        TONE: Healed lower abdominal scars High tone pelvic floor- gripping, does not lengthen with diaphragmatic breathing  Large older vertical abdominal scar Upper hest breathing  PROLAPSE: no  TODAY'S TREATMENT:                                                                                                                              DATE: 03/13/23    EVAL see below   Manual- abdominal massage, cupping  Neuro reed- pelvic circles on yoga ball, open books and DB     HOME EXERCISE PROGRAM: [id number]  ASSESSMENT:  CLINICAL IMPRESSION: Pt progressing well. Able to pee a little bit more She will benefit from PT to improve pelvic floor relaxation and reduce need for cathing.  Will try Dr Soundra Pilon yoga video for pelvic relexation for HEP    OBJECTIVE IMPAIRMENTS: decreased coordination, decreased ROM, increased edema, increased fascial restrictions, increased muscle spasms, impaired flexibility, and pain.   ACTIVITY LIMITATIONS: continence and toileting  PARTICIPATION LIMITATIONS: community activity  PERSONAL FACTORS: Age and Time since onset of injury/illness/exacerbation are also affecting patient's functional outcome.   REHAB POTENTIAL: Good  CLINICAL DECISION MAKING: Stable/uncomplicated  EVALUATION COMPLEXITY: Low   GOALS: Goals reviewed with patient? Yes  SHORT TERM GOALS: Target date: 03/19/2023    Pt will be I with her initial HEP Baseline: Goal status: INITIAL  2.  Pt will report reducing need to cath by 50% Baseline:  Goal status: INITIAL  3.  Pt will be I with manual pelvic floor release Baseline:  Goal status: INITIAL    LONG TERM GOALS: Target date: 05/14/2023    Pt will be I with her advanced HEP Baseline:  Goal status: INITIAL  2.  Pt will have 0/10 pain in her pelvic floor muscles Baseline:  Goal status: INITIAL  3.  Pt will be able to urinate as needed Baseline:  Goal status: INITIAL  4.  Pt will not leak with coughing, sneezing and laughing Baseline:  Goal status: INITIAL  5.  Pt will report 0 urinary urgency Baseline:  Goal status: INITIAL    PLAN:  PT FREQUENCY: 1-2x/week  PT DURATION: 12 weeks  PLANNED INTERVENTIONS: 97110-Therapeutic exercises, 97530- Therapeutic activity, 97112- Neuromuscular re-education, 97535- Self Care, [id number]- Manual therapy, Dry Needling,  Joint mobilization, Joint manipulation, Spinal manipulation, Spinal mobilization, Scar mobilization, and Biofeedback  PLAN FOR NEXT SESSION: cont down training   Nandana Krolikowski, PT 03/13/23 10:09 AM

## 2023-03-14 ENCOUNTER — Encounter: Payer: Self-pay | Admitting: Emergency Medicine

## 2023-03-14 ENCOUNTER — Ambulatory Visit: Payer: Medicare Other | Admitting: Emergency Medicine

## 2023-03-14 VITALS — BP 122/62 | HR 63 | Temp 97.9°F | Ht 62.0 in | Wt 184.0 lb

## 2023-03-14 DIAGNOSIS — R911 Solitary pulmonary nodule: Secondary | ICD-10-CM

## 2023-03-14 DIAGNOSIS — R918 Other nonspecific abnormal finding of lung field: Secondary | ICD-10-CM

## 2023-03-14 MED ORDER — ALBUTEROL SULFATE HFA 108 (90 BASE) MCG/ACT IN AERS: 2.0000 | INHALATION_SPRAY | RESPIRATORY_TRACT | 5 refills | Status: AC | PRN

## 2023-03-14 NOTE — Assessment & Plan Note (Signed)
All stable on surveillance imaging 02/20/2023.  These have now been unchanged going back to 08/2020.  Given their size 4-5 mm could consider stopping surveillance but she does have a significant history of lung cancer in her family.  I think it would be reasonable to follow the groundglass nodules for 5 years total.  She is in agreement with this.  Will get the next scan in January 2026  We reviewed your CT scan of the chest today.  Your pulmonary nodules are stable in size and appearance.  This is good news. We will plan to follow for 2 more years.  Next scan to be done in January 2026. We will refill your albuterol today.  Please use 2 puffs up to every 4 hours if needed for shortness of breath, chest tightness, wheezing. Follow Dr. Delton Coombes in January 2026 after your CT or sooner if you have any problems.

## 2023-03-14 NOTE — Progress Notes (Signed)
   Subjective:    Patient ID: Kara Ramos, female    DOB: [birth year], 73 y.o.   MRN: [id number]  HPI  ROV 03/14/2023 --74 year old woman with a minimal tobacco history, history of breast and cervical cancer, latent TB (childhood), asthma, allergic rhinitis, IBS.  She also has a strong family history of lung cancer.  We have been following some small solid and subsolid 5 mm pulmonary nodules. She may be having a bit of exertional SOB. She rarely uses albuterol, often in setting URI. No flares since last time.   CT chest 02/20/2023 reviewed by me, showed --no mediastinal adenopathy, right middle lobe 4 mm pulmonary nodule, unchanged going back to 08/2020 and consistent with an area of mucous plugging or bronchial.  Several additional scattered stable 4-5 mm groundglass nodules    Review of Systems As per HPI     Objective:   Physical Exam Vitals:   03/14/23 1041 03/14/23 1043  BP: 122/62 122/62  Pulse: 63 63  Temp:  97.9 F (36.6 C)  TempSrc:  Temporal  SpO2: 98%   Weight:  184 lb (83.5 kg)  Height:  5\' 2"  (1.575 m)    Gen: Pleasant, well-nourished, in no distress,  normal affect  ENT: No lesions,  mouth clear,  oropharynx clear, no postnasal drip  Neck: No JVD, no stridor  Lungs: No use of accessory muscles, no crackles or wheezing on normal respiration, no wheeze on forced expiration  Cardiovascular: RRR, heart sounds normal, no murmur or gallops, no peripheral edema  Musculoskeletal: No deformities, no cyanosis or clubbing  Neuro: alert, awake, non focal  Skin: Warm, no lesions or rash      Assessment & Plan:   Pulmonary nodules All stable on surveillance imaging 02/20/2023.  These have now been unchanged going back to 08/2020.  Given their size 4-5 mm could consider stopping surveillance but she does have a significant history of lung cancer in her family.  I think it would be reasonable to follow the groundglass nodules for 5 years total.  She is in agreement with  this.  Will get the next scan in January 2026  We reviewed your CT scan of the chest today.  Your pulmonary nodules are stable in size and appearance.  This is good news. We will plan to follow for 2 more years.  Next scan to be done in January 2026. We will refill your albuterol today.  Please use 2 puffs up to every 4 hours if needed for shortness of breath, chest tightness, wheezing. Follow Dr. Delton Coombes in January 2026 after your CT or sooner if you have any problems.     Levy Pupa, MD, PhD 03/14/2023, 2:25 PM  Pulmonary [facility] Care [phone] or if no answer before 7:00PM call [phone] For any issues after 7:00PM please call eLink [phone]

## 2023-03-14 NOTE — Patient Instructions (Signed)
We reviewed your CT scan of the chest today.  Your pulmonary nodules are stable in size and appearance.  This is good news. We will plan to follow for 2 more years.  Next scan to be done in January 2026. We will refill your albuterol today.  Please use 2 puffs up to every 4 hours if needed for shortness of breath, chest tightness, wheezing. Follow Dr. Delton Coombes in January 2026 after your CT or sooner if you have any problems.

## 2023-03-16 DIAGNOSIS — R339 Retention of urine, unspecified: Secondary | ICD-10-CM | POA: Diagnosis not present

## 2023-03-20 ENCOUNTER — Ambulatory Visit: Payer: Medicare Other | Attending: Urology | Admitting: Physical Therapy

## 2023-03-20 ENCOUNTER — Encounter: Payer: Self-pay | Admitting: Physical Therapy

## 2023-03-20 DIAGNOSIS — M62838 Other muscle spasm: Secondary | ICD-10-CM | POA: Diagnosis not present

## 2023-03-20 DIAGNOSIS — R278 Other lack of coordination: Secondary | ICD-10-CM | POA: Insufficient documentation

## 2023-03-20 NOTE — Therapy (Signed)
 OUTPATIENT PHYSICAL THERAPY FEMALE PELVIC TREATMENT   Patient Name: Kara Ramos MRN: [id number] DOB:04/15/1950, 73 y.o., female Today's Date: 03/20/2023  END OF SESSION:  PT End of Session - 03/20/23 1208     Visit Number 4    Date for PT Re-Evaluation 05/14/23    Authorization Type [id number]  NO AUTHOR REQ  Medical necessity  $10 COPAY  OOP:3,500  blue-e 02/19/2023-km    PT Start Time 1145    PT Stop Time 1230    PT Time Calculation (min) 45 min    Activity Tolerance Patient tolerated treatment well    Behavior During Therapy WFL for tasks assessed/performed                Past Medical History:  Diagnosis Date   Allergy     Arthritis    Asthma    Breast CA (HCC)    Breast cancer (HCC)    Cancer (HCC) 1978   cervical- had hysterectomy   Cataract    Removed both eyes   Complication of anesthesia    per patient 'one time was aware and awake during surgery and also BP can get really low   GERD (gastroesophageal reflux disease)    History of IBS    Irregular heart rhythm    occasional PAC'S, PVC's   Osteopenia    PAC (premature atrial contraction)    hx of   Personal history of radiation therapy    Past Surgical History:  Procedure Laterality Date   ABDOMINAL HYSTERECTOMY     both ovaries removed   APPENDECTOMY     BREAST LUMPECTOMY     BREAST LUMPECTOMY WITH RADIOACTIVE SEED LOCALIZATION Right 12/24/2018   Procedure: RIGHT BREAST LUMPECTOMY WITH RADIOACTIVE SEED LOCALIZATION;  Surgeon: Aron Shoulders, MD;  Location: [facility];  Service: General;  Laterality: Right;   BUNIONECTOMY     both feet   CATARACT EXTRACTION     right   COLON SURGERY  1988   part of colon removed d/t endometriosis   COLONOSCOPY  05/2017   One benign polyp   CORRECTION HAMMER TOE  03/2017   CYSTOSCOPY N/A 01/05/2023   Procedure: CYSTOSCOPY;  Surgeon: Cam Morene ORN, MD;  Location: [facility];  Service: Urology;  Laterality: N/A;   PUBOVAGINAL SLING N/A 01/05/2023   Procedure:  MID-URETHRAL SLING;  Surgeon: Cam Morene ORN, MD;  Location: [facility];  Service: Urology;  Laterality: N/A;  60 MINUTES   Patient Active Problem List   Diagnosis Date Noted   Pulmonary nodules 08/11/2020   Essential hypertension 03/23/2019   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   Adverse food reaction 10/23/2018   Mild intermittent asthma without complication 10/23/2018   Other allergic rhinitis 10/23/2018   PVC (premature ventricular contraction) 03/21/2018   Decreased estrogen level 04/04/2017   Menopausal problem 04/04/2017   Menopausal syndrome 04/04/2017   Osteopenia 04/04/2017   Vaginal irritation 04/04/2017   Irritable bowel syndrome 02/14/1968    PCP: Katina Pfeiffer, PA-C  REFERRING PROVIDER: Cam Morene ORN, MD  REFERRING DIAG: R33.9 (ICD-10-CM) - Urinary retention  THERAPY DIAG:  Other lack of coordination  Other muscle spasm  Rationale for Evaluation and Treatment: Rehabilitation  ONSET DATE: 10/22  SUBJECTIVE:  SUBJECTIVE STATEMENT: Pt reports that she peed on her own the other day- probably half a cup Feels like she needs to stretch, has never been flexible.    Fluid intake: yes   PAIN:  Are you having pain? No NPRS scale: 4/10 1st layer with palapation Pain location: Internal  Pain type: burning Pain description: intermittent   Aggravating factors: manual pressure Relieving factors: no manual pressure  PRECAUTIONS: None  RED FLAGS: None   WEIGHT BEARING RESTRICTIONS: No  FALLS:  Has patient fallen in last 6 months? No  LIVING ENVIRONMENT: Lives with: lives with their family and lives alone Lives in: House/apartment Stairs: No Has following equipment at home: None  OCCUPATION: retired  PLOF: Independent  PATIENT  GOALS: to be able to urinate and not leak  PERTINENT HISTORY:  Mid urethral sling 01/05/2023 Sexual abuse: No  BOWEL MOVEMENT: no issues   URINATION: Pain with urination: No Fully empty bladder: No Stream: Weak- unable to- has to cath Urgency: Yes:   Frequency: 5-6 times/ day - cathes Leakage:  no Pads: No  INTERCOURSE:n/a  PREGNANCY:   PROLAPSE: None   OBJECTIVE:  Note: Objective measures were completed at Evaluation unless otherwise noted.   COGNITION: Overall cognitive status: Within functional limits for tasks assessed     SENSATION: Light touch: Appears intact Proprioception: Appears intact  MUSCLE LENGTH: Hamstrings: Right 80 deg; Left 80 deg  LUMBAR SPECIAL TESTS:  FABER test: Negative     POSTURE: rounded shoulders, forward head, decreased lumbar lordosis, and flexed trunk   PELVIC ALIGNMENT:even  LUMBARAROM/PROM: slightly limited all planes   LOWER EXTREMITY ROM: bilateral hips IR limited   LOWER EXTREMITY MMT: grossly WFL PALPATION:   General  within functional limitations                 External Perineal Exam within functional limitations, some redness and swelling present                             Internal Pelvic Floor tight and tender layer 1  Patient confirms identification and approves PT to assess internal pelvic floor and treatment Yes  PELVIC MMT:   MMT eval  Vaginal 4/5  Internal Anal Sphincter   External Anal Sphincter   Puborectalis   Diastasis Recti   (Blank rows = not tested)        TONE: Healed lower abdominal scars High tone pelvic floor- gripping, does not lengthen with diaphragmatic breathing  Large older vertical abdominal scar Upper hest breathing  PROLAPSE: no  TODAY'S TREATMENT:                                                                                                                              DATE: 03/20/23       Neuro reed- hip stretches with stretch out strap  Thomas Google on yoga ball                     Figure four stretch                     Prone hip flexor stretch                     Diaphragmatic breathing Yoga ball circles      HOME EXERCISE PROGRAM: [id number]  ASSESSMENT:  CLINICAL IMPRESSION: Pt progressing well. Able to pee a little bit more She will benefit from PT to improve pelvic floor relaxation and reduce need for cathing.      OBJECTIVE IMPAIRMENTS: decreased coordination, decreased ROM, increased edema, increased fascial restrictions, increased muscle spasms, impaired flexibility, and pain.   ACTIVITY LIMITATIONS: continence and toileting  PARTICIPATION LIMITATIONS: community activity  PERSONAL FACTORS: Age and Time since onset of injury/illness/exacerbation are also affecting patient's functional outcome.   REHAB POTENTIAL: Good  CLINICAL DECISION MAKING: Stable/uncomplicated  EVALUATION COMPLEXITY: Low   GOALS: Goals reviewed with patient? Yes  SHORT TERM GOALS: Target date: 03/19/2023    Pt will be I with her initial HEP Baseline: Goal status: INITIAL  2.  Pt will report reducing need to cath by 50% Baseline:  Goal status: INITIAL  3.  Pt will be I with manual pelvic floor release Baseline:  Goal status: INITIAL    LONG TERM GOALS: Target date: 05/14/2023    Pt will be I with her advanced HEP Baseline:  Goal status: INITIAL  2.  Pt will have 0/10 pain in her pelvic floor muscles Baseline:  Goal status: INITIAL  3.  Pt will be able to urinate as needed Baseline:  Goal status: INITIAL  4.  Pt will not leak with coughing, sneezing and laughing Baseline:  Goal status: INITIAL  5.  Pt will report 0 urinary urgency Baseline:  Goal status: INITIAL    PLAN:  PT FREQUENCY: 1-2x/week  PT DURATION: 12 weeks  PLANNED INTERVENTIONS: 97110-Therapeutic exercises, 97530- Therapeutic activity, 97112- Neuromuscular re-education, 97535- Self Care, [id number]-  Manual therapy, Dry Needling, Joint mobilization, Joint manipulation, Spinal manipulation, Spinal mobilization, Scar mobilization, and Biofeedback  PLAN FOR NEXT SESSION: cont down training   Altair Appenzeller, PT 03/20/23 12:10 PM

## 2023-03-22 NOTE — Telephone Encounter (Signed)
 Attempted to call pt Kara Ramos for appt. LVM for call back. She is currently scheduled with Dr Lee Public 04/2023.

## 2023-03-30 ENCOUNTER — Ambulatory Visit: Payer: Medicare Other | Admitting: Physical Therapy

## 2023-03-30 ENCOUNTER — Encounter: Payer: Self-pay | Admitting: Physical Therapy

## 2023-03-30 DIAGNOSIS — M62838 Other muscle spasm: Secondary | ICD-10-CM

## 2023-03-30 DIAGNOSIS — R278 Other lack of coordination: Secondary | ICD-10-CM | POA: Diagnosis not present

## 2023-03-30 NOTE — Therapy (Signed)
OUTPATIENT PHYSICAL THERAPY FEMALE PELVIC TREATMENT   Patient Name: Kara Ramos MRN: [id number] DOB:02-12-1951, 73 y.o., female Today's Date: 03/30/2023  END OF SESSION:  PT End of Session - 03/30/23 0919     Visit Number 5    Date for PT Re-Evaluation 05/14/23    Authorization Type [id number]-[id number]  NO AUTHOR REQ  Medical necessity  $10 COPAY  OOP:3,500  blue-e 02/19/2023-km    PT Start Time 0850    PT Stop Time 0930    PT Time Calculation (min) 40 min    Activity Tolerance Patient tolerated treatment well    Behavior During Therapy WFL for tasks assessed/performed                 Past Medical History:  Diagnosis Date   Allergy    Arthritis    Asthma    Breast CA (HCC)    Breast cancer (HCC)    Cancer (HCC) 1978   cervical- had hysterectomy   Cataract    Removed both eyes   Complication of anesthesia    per patient 'one time was aware and awake during surgery and also BP can get really low"   GERD (gastroesophageal reflux disease)    History of IBS    Irregular heart rhythm    occasional PAC'S, PVC's   Osteopenia    PAC (premature atrial contraction)    hx of   Personal history of radiation therapy    Past Surgical History:  Procedure Laterality Date   ABDOMINAL HYSTERECTOMY     both ovaries removed   APPENDECTOMY     BREAST LUMPECTOMY     BREAST LUMPECTOMY WITH RADIOACTIVE SEED LOCALIZATION Right 12/24/2018   Procedure: RIGHT BREAST LUMPECTOMY WITH RADIOACTIVE SEED LOCALIZATION;  Surgeon: Almond Lint, MD;  Location: [facility];  Service: General;  Laterality: Right;   BUNIONECTOMY     both feet   CATARACT EXTRACTION     right   COLON SURGERY  1988   part of colon removed d/t endometriosis   COLONOSCOPY  05/2017   One benign polyp   CORRECTION HAMMER TOE  03/2017   CYSTOSCOPY N/A 01/05/2023   Procedure: CYSTOSCOPY;  Surgeon: Crist Fat, MD;  Location: [facility];  Service: Urology;  Laterality: N/A;   PUBOVAGINAL SLING N/A 01/05/2023   Procedure:  MID-URETHRAL SLING;  Surgeon: Crist Fat, MD;  Location: [facility];  Service: Urology;  Laterality: N/A;  60 MINUTES   Patient Active Problem List   Diagnosis Date Noted   Pulmonary nodules 08/11/2020   Essential hypertension 03/23/2019   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   Adverse food reaction 10/23/2018   Mild intermittent asthma without complication 10/23/2018   Other allergic rhinitis 10/23/2018   PVC (premature ventricular contraction) 03/21/2018   Decreased estrogen level 04/04/2017   Menopausal problem 04/04/2017   Menopausal syndrome 04/04/2017   Osteopenia 04/04/2017   Vaginal irritation 04/04/2017   Irritable bowel syndrome 02/14/1968    PCP: Jarrett Soho, PA-C  REFERRING PROVIDER: Crist Fat, MD  REFERRING DIAG: R33.9 (ICD-10-CM) - Urinary retention  THERAPY DIAG:  Other lack of coordination  Other muscle spasm  Rationale for Evaluation and Treatment: Rehabilitation  ONSET DATE: 10/22  SUBJECTIVE:  SUBJECTIVE STATEMENT: Pt reports that she was able to the other day on the own, still cathing.  Has not been super consistent with her HEP   Fluid intake: yes   PAIN:  Are you having pain? No NPRS scale: 4/10 1st layer with palapation Pain location: Internal  Pain type: burning Pain description: intermittent   Aggravating factors: manual pressure Relieving factors: no manual pressure  PRECAUTIONS: None  RED FLAGS: None   WEIGHT BEARING RESTRICTIONS: No  FALLS:  Has patient fallen in last 6 months? No  LIVING ENVIRONMENT: Lives with: lives with their family and lives alone Lives in: House/apartment Stairs: No Has following equipment at home: None  OCCUPATION: retired  PLOF: Independent  PATIENT GOALS: to be able  to urinate and not leak  PERTINENT HISTORY:  Mid urethral sling 01/05/2023 Sexual abuse: No  BOWEL MOVEMENT: no issues   URINATION: Pain with urination: No Fully empty bladder: No Stream: Weak- unable to- has to cath Urgency: Yes:   Frequency: 5-6 times/ day - cathes Leakage:  no Pads: No  INTERCOURSE:n/a  PREGNANCY:   PROLAPSE: None   OBJECTIVE:  Note: Objective measures were completed at Evaluation unless otherwise noted.   COGNITION: Overall cognitive status: Within functional limits for tasks assessed     SENSATION: Light touch: Appears intact Proprioception: Appears intact  MUSCLE LENGTH: Hamstrings: Right 80 deg; Left 80 deg  LUMBAR SPECIAL TESTS:  FABER test: Negative     POSTURE: rounded shoulders, forward head, decreased lumbar lordosis, and flexed trunk   PELVIC ALIGNMENT:even  LUMBARAROM/PROM: slightly limited all planes   LOWER EXTREMITY ROM: bilateral hips IR limited   LOWER EXTREMITY MMT: grossly WFL PALPATION:   General  within functional limitations                 External Perineal Exam within functional limitations, some redness and swelling present                             Internal Pelvic Floor tight and tender layer 1  Patient confirms identification and approves PT to assess internal pelvic floor and treatment Yes  PELVIC MMT:   MMT eval  Vaginal 4/5  Internal Anal Sphincter   External Anal Sphincter   Puborectalis   Diastasis Recti   (Blank rows = not tested)        TONE: Healed lower abdominal scars High tone pelvic floor- gripping, does not lengthen with diaphragmatic breathing  Large older vertical abdominal scar Upper hest breathing  PROLAPSE: no  TODAY'S TREATMENT:                                                                                                                              DATE: 03/30/23   Neuro reed- hip stretches with stretch with black theraband  Thomas  Google on yoga ball                     Figure four stretch                     Prone hip flexor stretch                     Diaphragmatic breathing Child's pose with DB Yoga ball circles Diaphragmatic breathing Seated hamstring stretch  Manual therapy- manual hip stretches   HOME EXERCISE PROGRAM: [id number]  ASSESSMENT:  CLINICAL IMPRESSION: Pt progressing well. Able to pee a little bit more She will benefit from PT to improve pelvic floor relaxation and reduce need for cathing.  Will try peppermint oil and water running in an app to help stimulate urination.     OBJECTIVE IMPAIRMENTS: decreased coordination, decreased ROM, increased edema, increased fascial restrictions, increased muscle spasms, impaired flexibility, and pain.   ACTIVITY LIMITATIONS: continence and toileting  PARTICIPATION LIMITATIONS: community activity  PERSONAL FACTORS: Age and Time since onset of injury/illness/exacerbation are also affecting patient's functional outcome.   REHAB POTENTIAL: Good  CLINICAL DECISION MAKING: Stable/uncomplicated  EVALUATION COMPLEXITY: Low   GOALS: Goals reviewed with patient? Yes  SHORT TERM GOALS: Target date: 03/19/2023    Pt will be I with her initial HEP Baseline: Goal status: INITIAL  2.  Pt will report reducing need to cath by 50% Baseline:  Goal status: INITIAL  3.  Pt will be I with manual pelvic floor release Baseline:  Goal status: INITIAL    LONG TERM GOALS: Target date: 05/14/2023    Pt will be I with her advanced HEP Baseline:  Goal status: INITIAL  2.  Pt will have 0/10 pain in her pelvic floor muscles Baseline:  Goal status: INITIAL  3.  Pt will be able to urinate as needed Baseline:  Goal status: INITIAL  4.  Pt will not leak with coughing, sneezing and laughing Baseline:  Goal status: INITIAL  5.  Pt will report 0 urinary urgency Baseline:  Goal status: INITIAL    PLAN:  PT FREQUENCY:  1-2x/week  PT DURATION: 12 weeks  PLANNED INTERVENTIONS: 97110-Therapeutic exercises, 97530- Therapeutic activity, 97112- Neuromuscular re-education, 97535- Self Care, 13244- Manual therapy, Dry Needling, Joint mobilization, Joint manipulation, Spinal manipulation, Spinal mobilization, Scar mobilization, and Biofeedback  PLAN FOR NEXT SESSION: cont down training   Anouk Critzer, PT 03/30/23 9:20 AM

## 2023-04-06 ENCOUNTER — Encounter: Payer: Self-pay | Admitting: Physical Therapy

## 2023-04-10 ENCOUNTER — Encounter: Payer: Self-pay | Admitting: Physical Therapy

## 2023-04-10 ENCOUNTER — Ambulatory Visit: Payer: Medicare Other | Admitting: Physical Therapy

## 2023-04-10 DIAGNOSIS — R278 Other lack of coordination: Secondary | ICD-10-CM | POA: Diagnosis not present

## 2023-04-10 DIAGNOSIS — M62838 Other muscle spasm: Secondary | ICD-10-CM | POA: Diagnosis not present

## 2023-04-10 NOTE — Assessment & Plan Note (Signed)
 12/24/2018: Right lumpectomy by Dr. Donell Beers: Intermediate grade DCIS, 1 cm, margins clear, ER 95%, PR 60%, Tis NX stage 0   Treatment plan: 1.  Adjuvant radiation therapy 01/21/2019-02/18/2019 2.  Adjuvant antiestrogen therapy with tamoxifen x5 years. Started 03/05/2019   Tamoxifen toxicities: Moderate hot flashes: Stable Frequent UTIs: Currently using Premarin.  Much improved   Breast cancer surveillance: 1.  Breast exam: 04/11/23: Benign 2. mammogram 12/14/22: Benign breast density category C   CT chest 02/19/2022: Multiple small nodules in the lungs stable (follows with Dr. Corliss Blacker)   Return to clinic in  1 year for follow-up

## 2023-04-10 NOTE — Therapy (Signed)
 OUTPATIENT PHYSICAL THERAPY FEMALE PELVIC TREATMENT   Patient Name: Kara Ramos MRN: [id number] DOB:[birth year], 73 y.o., female Today's Date: 04/10/2023  END OF SESSION:  PT End of Session - 04/10/23 1440     Visit Number 6    Date for PT Re-Evaluation 05/14/23    Authorization Type [id number]  NO AUTHOR REQ  Medical necessity  $10 COPAY  OOP:3,500  blue-e 02/19/2023-km    PT Start Time 1400    PT Stop Time 1435    PT Time Calculation (min) 35 min    Activity Tolerance Patient tolerated treatment well    Behavior During Therapy WFL for tasks assessed/performed                  Past Medical History:  Diagnosis Date   Allergy    Arthritis    Asthma    Breast CA (HCC)    Breast cancer (HCC)    Cancer (HCC) 1978   cervical- had hysterectomy   Cataract    Removed both eyes   Complication of anesthesia    per patient 'one time was aware and awake during surgery and also BP can get really low"   GERD (gastroesophageal reflux disease)    History of IBS    Irregular heart rhythm    occasional PAC'S, PVC's   Osteopenia    PAC (premature atrial contraction)    hx of   Personal history of radiation therapy    Past Surgical History:  Procedure Laterality Date   ABDOMINAL HYSTERECTOMY     both ovaries removed   APPENDECTOMY     BREAST LUMPECTOMY     BREAST LUMPECTOMY WITH RADIOACTIVE SEED LOCALIZATION Right 12/24/2018   Procedure: RIGHT BREAST LUMPECTOMY WITH RADIOACTIVE SEED LOCALIZATION;  Surgeon: Almond Lint, MD;  Location: [facility] OR;  Service: General;  Laterality: Right;   BUNIONECTOMY     both feet   CATARACT EXTRACTION     right   COLON SURGERY  1988   part of colon removed d/t endometriosis   COLONOSCOPY  05/2017   One benign polyp   CORRECTION HAMMER TOE  03/2017   CYSTOSCOPY N/A 01/05/2023   Procedure: CYSTOSCOPY;  Surgeon: Crist Fat, MD;  Location: [facility];  Service: Urology;  Laterality: N/A;   PUBOVAGINAL SLING N/A 01/05/2023    Procedure: MID-URETHRAL SLING;  Surgeon: Crist Fat, MD;  Location: [facility];  Service: Urology;  Laterality: N/A;  60 MINUTES   Patient Active Problem List   Diagnosis Date Noted   Pulmonary nodules 08/11/2020   Essential hypertension 03/23/2019   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   Adverse food reaction 10/23/2018   Mild intermittent asthma without complication 10/23/2018   Other allergic rhinitis 10/23/2018   PVC (premature ventricular contraction) 03/21/2018   Decreased estrogen level 04/04/2017   Menopausal problem 04/04/2017   Menopausal syndrome 04/04/2017   Osteopenia 04/04/2017   Vaginal irritation 04/04/2017   Irritable bowel syndrome 02/14/1968    PCP: Jarrett Soho, PA-C  REFERRING PROVIDER: Crist Fat, MD  REFERRING DIAG: R33.9 (ICD-10-CM) - Urinary retention  THERAPY DIAG:  Other lack of coordination  Other muscle spasm  Rationale for Evaluation and Treatment: Rehabilitation  ONSET DATE: 10/22  SUBJECTIVE:  SUBJECTIVE STATEMENT: Pt reports that she is doing pretty well. Peeing with bearing down some. Never feels like she completely empties. Pees about 2/ day.Cathes as well Getting a second opinion, wondering about a surgery  Fluid intake: yes   PAIN:  Are you having pain? No NPRS scale: 4/10 1st layer with palapation Pain location: Internal  Pain type: burning Pain description: intermittent   Aggravating factors: manual pressure Relieving factors: no manual pressure  PRECAUTIONS: None  RED FLAGS: None   WEIGHT BEARING RESTRICTIONS: No  FALLS:  Has patient fallen in last 6 months? No  LIVING ENVIRONMENT: Lives with: lives with their family and lives alone Lives in: House/apartment Stairs: No Has following  equipment at home: None  OCCUPATION: retired  PLOF: Independent  PATIENT GOALS: to be able to urinate and not leak  PERTINENT HISTORY:  Mid urethral sling 01/05/2023 Sexual abuse: No  BOWEL MOVEMENT: no issues   URINATION: Pain with urination: No Fully empty bladder: No Stream: Weak- unable to- has to cath Urgency: Yes:   Frequency: 5-6 times/ day - cathes Leakage:  no Pads: No  INTERCOURSE:n/a  PREGNANCY:   PROLAPSE: None   OBJECTIVE:  Note: Objective measures were completed at Evaluation unless otherwise noted.   COGNITION: Overall cognitive status: Within functional limits for tasks assessed     SENSATION: Light touch: Appears intact Proprioception: Appears intact  MUSCLE LENGTH: Hamstrings: Right 80 deg; Left 80 deg  LUMBAR SPECIAL TESTS:  FABER test: Negative     POSTURE: rounded shoulders, forward head, decreased lumbar lordosis, and flexed trunk   PELVIC ALIGNMENT:even  LUMBARAROM/PROM: slightly limited all planes   LOWER EXTREMITY ROM: bilateral hips IR limited   LOWER EXTREMITY MMT: grossly WFL PALPATION:   General  within functional limitations                 External Perineal Exam within functional limitations, some redness and swelling present                             Internal Pelvic Floor tight and tender layer 1  Patient confirms identification and approves PT to assess internal pelvic floor and treatment Yes  PELVIC MMT:   MMT eval  Vaginal 4/5  Internal Anal Sphincter   External Anal Sphincter   Puborectalis   Diastasis Recti   (Blank rows = not tested)        TONE: Healed lower abdominal scars High tone pelvic floor- gripping, does not lengthen with diaphragmatic breathing  Large older vertical abdominal scar Upper hest breathing  PROLAPSE: no  TODAY'S TREATMENT:                                                                                                                              DATE: 04/10/23     Manual therapy- manual hip stretches, STM abdominal scar and lower abdomen around bladder, internal layer 1 and  2 pelvic floor muscle release There acts- dilator education, lubricant- 5 mins every other day   HOME EXERCISE PROGRAM: [id number]  ASSESSMENT:  CLINICAL IMPRESSION: Pt progressing well. Able to pee a little bit more She will benefit from PT to improve pelvic floor relaxation and reduce need for cathing. Educated pt on trying Dr. Selinda Orion dilator with lubricant for home self PF muscles release. She dem high tone with tenderness in PF bilateral mainly 5 o clock and 7 o clock     OBJECTIVE IMPAIRMENTS: decreased coordination, decreased ROM, increased edema, increased fascial restrictions, increased muscle spasms, impaired flexibility, and pain.   ACTIVITY LIMITATIONS: continence and toileting  PARTICIPATION LIMITATIONS: community activity  PERSONAL FACTORS: Age and Time since onset of injury/illness/exacerbation are also affecting patient's functional outcome.   REHAB POTENTIAL: Good  CLINICAL DECISION MAKING: Stable/uncomplicated  EVALUATION COMPLEXITY: Low   GOALS: Goals reviewed with patient? Yes  SHORT TERM GOALS: Target date: 03/19/2023    Pt will be I with her initial HEP Baseline: Goal status: met 2.  Pt will report reducing need to cath by 50% Baseline:  Goal status: progressing  3.  Pt will be I with manual pelvic floor release Baseline:  Goal status: progressing    LONG TERM GOALS: Target date: 05/14/2023    Pt will be I with her advanced HEP Baseline:  Goal status: INITIAL  2.  Pt will have 0/10 pain in her pelvic floor muscles Baseline:  Goal status: INITIAL  3.  Pt will be able to urinate as needed Baseline:  Goal status: INITIAL  4.  Pt will not leak with coughing, sneezing and laughing Baseline:  Goal status: INITIAL  5.  Pt will report 0 urinary urgency Baseline:  Goal status: INITIAL    PLAN:  PT FREQUENCY:  1-2x/week  PT DURATION: 12 weeks  PLANNED INTERVENTIONS: 97110-Therapeutic exercises, 97530- Therapeutic activity, 97112- Neuromuscular re-education, 97535- Self Care, [id number]- Manual therapy, Dry Needling, Joint mobilization, Joint manipulation, Spinal manipulation, Spinal mobilization, Scar mobilization, and Biofeedback  PLAN FOR NEXT SESSION: cont down training   Nyisha Clippard, PT 04/10/23 2:42 PM

## 2023-04-11 ENCOUNTER — Inpatient Hospital Stay: Payer: Medicare Other | Attending: Hematology and Oncology | Admitting: Hematology and Oncology

## 2023-04-11 VITALS — BP 124/76 | HR 66 | Temp 97.5°F | Resp 17 | Ht 62.0 in | Wt 181.7 lb

## 2023-04-11 DIAGNOSIS — Z1721 Progesterone receptor positive status: Secondary | ICD-10-CM | POA: Diagnosis not present

## 2023-04-11 DIAGNOSIS — Z79899 Other long term (current) drug therapy: Secondary | ICD-10-CM | POA: Diagnosis not present

## 2023-04-11 DIAGNOSIS — Z1501 Genetic susceptibility to malignant neoplasm of breast: Secondary | ICD-10-CM | POA: Diagnosis not present

## 2023-04-11 DIAGNOSIS — Z1509 Genetic susceptibility to other malignant neoplasm: Secondary | ICD-10-CM

## 2023-04-11 DIAGNOSIS — Z170 Estrogen receptor positive status [ER+]: Secondary | ICD-10-CM | POA: Diagnosis not present

## 2023-04-11 DIAGNOSIS — D0511 Intraductal carcinoma in situ of right breast: Secondary | ICD-10-CM | POA: Insufficient documentation

## 2023-04-11 NOTE — Progress Notes (Signed)
 Patient Care Team: Jarrett Soho, PA-C as PCP - General (Family Medicine) Serena Croissant, MD as Consulting Physician (Hematology and Oncology) Dayrin Puffer, MD as Consulting Physician (Radiation Oncology) Almond Lint, MD as Consulting Physician (General Surgery)  DIAGNOSIS:  Encounter Diagnosis  Name Primary?   Ductal carcinoma in situ (DCIS) of right breast Yes    SUMMARY OF ONCOLOGIC HISTORY: Oncology History  Ductal carcinoma in situ (DCIS) of right breast  11/01/2018 Initial Diagnosis   Routine screening mammogram detected calcifications in the LIQ spanning 1.6cm. 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    12/24/2018 Surgery   Right lumpectomy Donell Beers) [phone]): DCIS, intermediate grade, 1.0cm, clear margins. No regional lymph nodes were examined.   12/24/2018 Cancer Staging   Staging form: Breast, AJCC 8th Edition - Pathologic stage from 12/24/2018: Stage 0 (pTis (DCIS), pN0, cM0, ER+, PR+)    01/20/2019 - 02/18/2019 Radiation Therapy   The patient initially received a dose of 42.56 Gy in 16 fractions to the breast using whole-breast tangent fields. This was delivered using a 3-D conformal technique. The patient then received a boost to the seroma. This delivered an additional 8 Gy in 41fractions using a 3 field photon technique due to the depth of the seroma. The total dose was 50.56 Gy.   02/2019 - 02/2024 Anti-estrogen oral therapy   Tamoxifen     CHIEF COMPLIANT:   HISTORY OF PRESENT ILLNESS: Discussed the use of AI scribe software for clinical note transcription with the patient, who gave verbal consent to proceed.  History of Present Illness      ALLERGIES:  is allergic to azithromycin, molnupiravir, bactrim [sulfamethoxazole-trimethoprim], doxycycline, erythromycin, tape, and tessalon [benzonatate].  MEDICATIONS:  Current Outpatient Medications  Medication Sig Dispense Refill   fluticasone (FLONASE) 50 MCG/ACT  nasal spray Place 1 spray into both nostrils daily as needed for allergies or rhinitis.     loratadine (CLARITIN) 10 MG tablet Take 10 mg by mouth daily.     Methylcellulose, Laxative, (CITRUCEL PO) Take 1 Scoop by mouth daily.     metoprolol succinate (TOPROL-XL) 25 MG 24 hr tablet TAKE 1 TABLET BY MOUTH EVERY DAY 60 tablet 3   omeprazole (PRILOSEC) 20 MG capsule Take 1 capsule by mouth daily before breakfast.     tamoxifen (NOLVADEX) 20 MG tablet TAKE 1 TABLET(20 MG) BY MOUTH DAILY 90 tablet 3   tamsulosin (FLOMAX) 0.4 MG CAPS capsule Take 0.4 mg by mouth daily.     acetaminophen (TYLENOL) 500 MG tablet Take 500 mg by mouth every 6 (six) hours as needed for moderate pain (pain score 4-6). (Patient not taking: Reported on 04/11/2023)     albuterol (VENTOLIN HFA) 108 (90 Base) MCG/ACT inhaler Inhale 2 puffs into the lungs every 4 (four) hours as needed for wheezing or shortness of breath. (Patient not taking: Reported on 04/11/2023) 6.7 g 5   Ascorbic Acid (VITAMIN C) 500 MG CAPS Take 500 mg by mouth daily. (Patient not taking: Reported on 04/11/2023)     Calcium Carb-Cholecalciferol (CALCIUM 500 + D PO) Take 1 tablet by mouth daily. (Patient not taking: Reported on 04/11/2023)     calcium elemental as carbonate (TUMS ULTRA 1000) 400 MG chewable tablet Chew 2,000 mg by mouth daily as needed for heartburn. (Patient not taking: Reported on 04/11/2023)     Cholecalciferol (VITAMIN D3 ULTRA STRENGTH) 125 MCG (5000 UT) capsule Take 5,000 Units by mouth daily. (Patient not taking: Reported on 04/11/2023)  COMIRNATY syringe  (Patient not taking: Reported on 04/11/2023)     CRANBERRY PO Take 30,000 mg by mouth 2 (two) times daily. (Patient not taking: Reported on 04/11/2023)     desonide (DESOWEN) 0.05 % cream Apply 1 Application topically 2 (two) times daily as needed (dandruff). (Patient not taking: Reported on 04/11/2023)     hydrocortisone (ANUSOL-HC) 2.5 % rectal cream Place 1 application. rectally at bedtime  as needed for hemorrhoids or anal itching. (Patient not taking: Reported on 04/11/2023) 30 g 2   PREMARIN vaginal cream Place 1 applicator vaginally daily as needed (irritation/dryness). (Patient not taking: Reported on 04/11/2023)     No current facility-administered medications for this visit.    PHYSICAL EXAMINATION: ECOG PERFORMANCE STATUS: 1 - Symptomatic but completely ambulatory  Vitals:   04/11/23 0930 04/11/23 0937  BP: (!) 132/42 124/76  Pulse: 66   Resp: 17   Temp: (!) 97.5 F (36.4 C)   SpO2: 97%    Filed Weights   04/11/23 0930  Weight: 181 lb 11.2 oz (82.4 kg)    Physical Exam   (exam performed in the presence of a chaperone)  LABORATORY DATA:  I have reviewed the data as listed    Latest Ref Rng & Units 01/06/2023    4:02 PM 01/03/2023    9:13 AM 08/11/2020    9:56 AM  CMP  Glucose 70 - 99 mg/dL 97  829  89   BUN 8 - 23 mg/dL 17  19  19    Creatinine 0.44 - 1.00 mg/dL 5.62  1.30  8.65   Sodium 135 - 145 mmol/L 138  138  140   Potassium 3.5 - 5.1 mmol/L 4.2  3.4  4.3   Chloride 98 - 111 mmol/L 103  107  103   CO2 22 - 32 mmol/L 26  22  30    Calcium 8.9 - 10.3 mg/dL 9.1  9.0  9.6     Lab Results  Component Value Date   WBC 10.1 01/06/2023   HGB 11.4 (L) 01/06/2023   HCT 35.1 (L) 01/06/2023   MCV 88.9 01/06/2023   PLT 212 01/06/2023   NEUTROABS 6.9 01/06/2023    ASSESSMENT & PLAN:  Ductal carcinoma in situ (DCIS) of right breast 12/24/2018: Right lumpectomy by Dr. Donell Beers: Intermediate grade DCIS, 1 cm, margins clear, ER 95%, PR 60%, Tis NX stage 0   Treatment plan: 1.  Adjuvant radiation therapy 01/21/2019-02/18/2019 2.  Adjuvant antiestrogen therapy with tamoxifen x5 years. Started 03/05/2019   Tamoxifen toxicities: Moderate hot flashes: Stable Frequent UTIs: Currently using Premarin.  Much improved   Breast cancer surveillance: 1.  Breast exam: 04/11/23: Benign 2. mammogram 12/14/22: Benign breast density category C   CT chest 02/19/2022:  Multiple small nodules in the lungs stable (follows with Dr. Corliss Blacker)   Return to clinic in  1 year for follow-up ------------------------------------- Assessment and Plan Assessment & Plan       No orders of the defined types were placed in this encounter.  The patient has a good understanding of the overall plan. she agrees with it. she will call with any problems that may develop before the next visit here. Total time spent: 30 mins including face to face time and time spent for planning, charting and co-ordination of care   Tamsen Meek, MD 04/11/23

## 2023-04-11 NOTE — Progress Notes (Signed)
 She is here in the room and reports that she  Patient Care Team: Jarrett Soho, PA-C as PCP - General (Family Medicine) Serena Croissant, MD as Consulting Physician (Hematology and Oncology) Bernette Puffer, MD as Consulting Physician (Radiation Oncology) Almond Lint, MD as Consulting Physician (General Surgery)  DIAGNOSIS:  Encounter Diagnosis  Name Primary?   Ductal carcinoma in situ (DCIS) of right breast Yes    SUMMARY OF ONCOLOGIC HISTORY: Oncology History  Ductal carcinoma in situ (DCIS) of right breast  11/01/2018 Initial Diagnosis   Routine screening mammogram detected calcifications in the LIQ spanning 1.6cm. 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    12/24/2018 Surgery   Right lumpectomy Donell Beers) [phone]): DCIS, intermediate grade, 1.0cm, clear margins. No regional lymph nodes were examined.   12/24/2018 Cancer Staging   Staging form: Breast, AJCC 8th Edition - Pathologic stage from 12/24/2018: Stage 0 (pTis (DCIS), pN0, cM0, ER+, PR+)    01/20/2019 - 02/18/2019 Radiation Therapy   The patient initially received a dose of 42.56 Gy in 16 fractions to the breast using whole-breast tangent fields. This was delivered using a 3-D conformal technique. The patient then received a boost to the seroma. This delivered an additional 8 Gy in 51fractions using a 3 field photon technique due to the depth of the seroma. The total dose was 50.56 Gy.   02/2019 - 02/2024 Anti-estrogen oral therapy   Tamoxifen     CHIEF COMPLIANT: Follow-up on tamoxifen therapy and to discuss the BRCA2 gene mutation  HISTORY OF PRESENT ILLNESS:   History of Present Illness The patient, with a history of bladder issues and breast cancer, presents for a routine follow-up. They underwent a bladder sling procedure in November and have been experiencing urinary retention since. They have been self-catheterizing and are considering a second opinion regarding the need for  a sphincter device.  The patient has been on tamoxifen for four years, with no reported issues. They report occasional hot flashes, which are worse in the summer. They are due to complete tamoxifen in January 2021.  The patient also expresses concern about their BRCA2 gene and the potential for ovarian cancer, as their paternal grandmother died of an unspecified female cancer in 25. They have had genetic testing done through [facility] and are awaiting further testing and consultation.  The patient also mentions a history of hysterectomy and oophorectomy. They are currently taking tamoxifen, omeprazole, and other medications as needed.     ALLERGIES:  is allergic to azithromycin, molnupiravir, bactrim [sulfamethoxazole-trimethoprim], doxycycline, erythromycin, tape, and tessalon [benzonatate].  MEDICATIONS:  Current Outpatient Medications  Medication Sig Dispense Refill   fluticasone (FLONASE) 50 MCG/ACT nasal spray Place 1 spray into both nostrils daily as needed for allergies or rhinitis.     loratadine (CLARITIN) 10 MG tablet Take 10 mg by mouth daily.     Methylcellulose, Laxative, (CITRUCEL PO) Take 1 Scoop by mouth daily.     metoprolol succinate (TOPROL-XL) 25 MG 24 hr tablet TAKE 1 TABLET BY MOUTH EVERY DAY 60 tablet 3   omeprazole (PRILOSEC) 20 MG capsule Take 1 capsule by mouth daily before breakfast.     tamoxifen (NOLVADEX) 20 MG tablet TAKE 1 TABLET(20 MG) BY MOUTH DAILY 90 tablet 3   tamsulosin (FLOMAX) 0.4 MG CAPS capsule Take 0.4 mg by mouth daily.     acetaminophen (TYLENOL) 500 MG tablet Take 500 mg by mouth every 6 (six) hours as needed for moderate pain (pain score 4-6). (Patient not taking:  Reported on 04/11/2023)     albuterol (VENTOLIN HFA) 108 (90 Base) MCG/ACT inhaler Inhale 2 puffs into the lungs every 4 (four) hours as needed for wheezing or shortness of breath. (Patient not taking: Reported on 04/11/2023) 6.7 g 5   Ascorbic Acid (VITAMIN C) 500 MG CAPS Take 500 mg by  mouth daily. (Patient not taking: Reported on 04/11/2023)     Calcium Carb-Cholecalciferol (CALCIUM 500 + D PO) Take 1 tablet by mouth daily. (Patient not taking: Reported on 04/11/2023)     calcium elemental as carbonate (TUMS ULTRA 1000) 400 MG chewable tablet Chew 2,000 mg by mouth daily as needed for heartburn. (Patient not taking: Reported on 04/11/2023)     Cholecalciferol (VITAMIN D3 ULTRA STRENGTH) 125 MCG (5000 UT) capsule Take 5,000 Units by mouth daily. (Patient not taking: Reported on 04/11/2023)     COMIRNATY syringe  (Patient not taking: Reported on 04/11/2023)     CRANBERRY PO Take 30,000 mg by mouth 2 (two) times daily. (Patient not taking: Reported on 04/11/2023)     desonide (DESOWEN) 0.05 % cream Apply 1 Application topically 2 (two) times daily as needed (dandruff). (Patient not taking: Reported on 04/11/2023)     hydrocortisone (ANUSOL-HC) 2.5 % rectal cream Place 1 application. rectally at bedtime as needed for hemorrhoids or anal itching. (Patient not taking: Reported on 04/11/2023) 30 g 2   PREMARIN vaginal cream Place 1 applicator vaginally daily as needed (irritation/dryness). (Patient not taking: Reported on 04/11/2023)     No current facility-administered medications for this visit.    PHYSICAL EXAMINATION: ECOG PERFORMANCE STATUS: 1 - Symptomatic but completely ambulatory  Vitals:   04/11/23 0930 04/11/23 0937  BP: (!) 132/42 124/76  Pulse: 66   Resp: 17   Temp: (!) 97.5 F (36.4 C)   SpO2: 97%    Filed Weights   04/11/23 0930  Weight: 181 lb 11.2 oz (82.4 kg)     LABORATORY DATA:  I have reviewed the data as listed    Latest Ref Rng & Units 01/06/2023    4:02 PM 01/03/2023    9:13 AM 08/11/2020    9:56 AM  CMP  Glucose 70 - 99 mg/dL 97  161  89   BUN 8 - 23 mg/dL 17  19  19    Creatinine 0.44 - 1.00 mg/dL 0.96  0.45  4.09   Sodium 135 - 145 mmol/L 138  138  140   Potassium 3.5 - 5.1 mmol/L 4.2  3.4  4.3   Chloride 98 - 111 mmol/L 103  107  103   CO2 22 -  32 mmol/L 26  22  30    Calcium 8.9 - 10.3 mg/dL 9.1  9.0  9.6     Lab Results  Component Value Date   WBC 10.1 01/06/2023   HGB 11.4 (L) 01/06/2023   HCT 35.1 (L) 01/06/2023   MCV 88.9 01/06/2023   PLT 212 01/06/2023   NEUTROABS 6.9 01/06/2023    ASSESSMENT & PLAN:  Ductal carcinoma in situ (DCIS) of right breast 12/24/2018: Right lumpectomy by Dr. Donell Beers: Intermediate grade DCIS, 1 cm, margins clear, ER 95%, PR 60%, Tis NX stage 0   Treatment plan: 1.  Adjuvant radiation therapy 01/21/2019-02/18/2019 2.  Adjuvant antiestrogen therapy with tamoxifen x5 years. Started 03/05/2019   Tamoxifen toxicities: Moderate hot flashes: Stable Frequent UTIs: Currently using Premarin.  Underwent sling procedure and she is having complications as a result of that with self-catheterization required at this time.  Breast cancer surveillance: 1.  Breast exam: 04/11/23: Benign 2. mammogram 12/14/22: Benign breast density category C   CT chest 02/19/2022: Multiple small nodules in the lungs stable (follows with Dr. Corliss Blacker)   Return to clinic in  1 year for follow-up ------------------------------------- Assessment and Plan Assessment & Plan BRCA2 gene mutation: Pathogenic variant detected in the BRCA2 gene. Discussed the implications of the mutation, including the risk of breast cancer, ovarian cancer, pancreatic cancer, and melanoma. -Continue annual mammograms and introduce MRI starting in April for additional screening due to BRCA2 gene mutation. -Consider contrast-enhanced mammograms in the future if decision is made to reduce MRI frequency. -Continue annual check-ins.  Urinary retention post bladder sling: Patient has been self-catheterizing due to urinary retention post bladder sling surgery. A second opinion is being sought regarding the need for further intervention. -Continue self-catheterization as needed. -Seek second opinion regarding further intervention.  Breast Cancer: Patient is  currently on tamoxifen, tolerating it well with minimal hot flashes. Less than a year left on the medication. -Continue tamoxifen as prescribed.  General Health Maintenance: -Continue current medications as prescribed, with refills as needed. -Contact patient after MRI results are available.      No orders of the defined types were placed in this encounter.  The patient has a good understanding of the overall plan. she agrees with it. she will call with any problems that may develop before the next visit here. Total time spent: 30 mins including face to face time and time spent for planning, charting and co-ordination of care   Tamsen Meek, MD 04/11/23

## 2023-04-17 ENCOUNTER — Encounter: Payer: Self-pay | Admitting: Physical Therapy

## 2023-04-17 ENCOUNTER — Ambulatory Visit: Payer: Medicare Other | Attending: Urology | Admitting: Physical Therapy

## 2023-04-17 DIAGNOSIS — R278 Other lack of coordination: Secondary | ICD-10-CM | POA: Diagnosis not present

## 2023-04-17 DIAGNOSIS — M62838 Other muscle spasm: Secondary | ICD-10-CM | POA: Insufficient documentation

## 2023-04-17 NOTE — Therapy (Signed)
 OUTPATIENT PHYSICAL THERAPY FEMALE PELVIC TREATMENT   Patient Name: Kara Ramos MRN: [id number] DOB:January 17, 1951, 73 y.o., female Today's Date: 04/17/2023  END OF SESSION:  PT End of Session - 04/17/23 1306     Visit Number 7    Date for PT Re-Evaluation 05/14/23    Authorization Type [id number]  NO AUTHOR REQ  Medical necessity  $10 COPAY  OOP:3,500  blue-e 02/19/2023-km    PT Start Time 1230    PT Stop Time 1315    PT Time Calculation (min) 45 min    Activity Tolerance Patient tolerated treatment well    Behavior During Therapy WFL for tasks assessed/performed                   Past Medical History:  Diagnosis Date   Allergy    Arthritis    Asthma    Breast CA (HCC)    Breast cancer (HCC)    Cancer (HCC) 1978   cervical- had hysterectomy   Cataract    Removed both eyes   Complication of anesthesia    per patient 'one time was aware and awake during surgery and also BP can get really low"   GERD (gastroesophageal reflux disease)    History of IBS    Irregular heart rhythm    occasional PAC'S, PVC's   Osteopenia    PAC (premature atrial contraction)    hx of   Personal history of radiation therapy    Past Surgical History:  Procedure Laterality Date   ABDOMINAL HYSTERECTOMY     both ovaries removed   APPENDECTOMY     BREAST LUMPECTOMY     BREAST LUMPECTOMY WITH RADIOACTIVE SEED LOCALIZATION Right 12/24/2018   Procedure: RIGHT BREAST LUMPECTOMY WITH RADIOACTIVE SEED LOCALIZATION;  Surgeon: Almond Lint, MD;  Location: [facility];  Service: General;  Laterality: Right;   BUNIONECTOMY     both feet   CATARACT EXTRACTION     right   COLON SURGERY  1988   part of colon removed d/t endometriosis   COLONOSCOPY  05/2017   One benign polyp   CORRECTION HAMMER TOE  03/2017   CYSTOSCOPY N/A 01/05/2023   Procedure: CYSTOSCOPY;  Surgeon: Crist Fat, MD;  Location: [facility];  Service: Urology;  Laterality: N/A;   PUBOVAGINAL SLING N/A 01/05/2023    Procedure: MID-URETHRAL SLING;  Surgeon: Crist Fat, MD;  Location: [facility];  Service: Urology;  Laterality: N/A;  60 MINUTES   Patient Active Problem List   Diagnosis Date Noted   Pulmonary nodules 08/11/2020   Essential hypertension 03/23/2019   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   Adverse food reaction 10/23/2018   Mild intermittent asthma without complication 10/23/2018   Other allergic rhinitis 10/23/2018   PVC (premature ventricular contraction) 03/21/2018   Decreased estrogen level 04/04/2017   Menopausal problem 04/04/2017   Menopausal syndrome 04/04/2017   Osteopenia 04/04/2017   Vaginal irritation 04/04/2017   Irritable bowel syndrome 02/14/1968    PCP: Jarrett Soho, PA-C  REFERRING PROVIDER: Crist Fat, MD  REFERRING DIAG: R33.9 (ICD-10-CM) - Urinary retention  THERAPY DIAG:  Other lack of coordination  Other muscle spasm  Rationale for Evaluation and Treatment: Rehabilitation  ONSET DATE: 10/22  SUBJECTIVE:  SUBJECTIVE STATEMENT: Pt reports that she is doing about the the same. Got really constipated after last week's internal Has second opinion is tomorrow.      Fluid intake: yes   PAIN:  Are you having pain? No NPRS scale: 4/10 1st layer with palapation Pain location: Internal  Pain type: burning Pain description: intermittent   Aggravating factors: manual pressure Relieving factors: no manual pressure  PRECAUTIONS: None  RED FLAGS: None   WEIGHT BEARING RESTRICTIONS: No  FALLS:  Has patient fallen in last 6 months? No  LIVING ENVIRONMENT: Lives with: lives with their family and lives alone Lives in: House/apartment Stairs: No Has following equipment at home: None  OCCUPATION: retired  PLOF:  Independent  PATIENT GOALS: to be able to urinate and not leak  PERTINENT HISTORY:  Mid urethral sling 01/05/2023 Sexual abuse: No  BOWEL MOVEMENT: no issues   URINATION: Pain with urination: No Fully empty bladder: No Stream: Weak- unable to- has to cath Urgency: Yes:   Frequency: 5-6 times/ day - cathes Leakage:  no Pads: No  INTERCOURSE:n/a  PREGNANCY:   PROLAPSE: None   OBJECTIVE:  Note: Objective measures were completed at Evaluation unless otherwise noted.   COGNITION: Overall cognitive status: Within functional limits for tasks assessed     SENSATION: Light touch: Appears intact Proprioception: Appears intact  MUSCLE LENGTH: Hamstrings: Right 80 deg; Left 80 deg  LUMBAR SPECIAL TESTS:  FABER test: Negative     POSTURE: rounded shoulders, forward head, decreased lumbar lordosis, and flexed trunk   PELVIC ALIGNMENT:even  LUMBARAROM/PROM: slightly limited all planes   LOWER EXTREMITY ROM: bilateral hips IR limited   LOWER EXTREMITY MMT: grossly WFL PALPATION:   General  within functional limitations                 External Perineal Exam within functional limitations, some redness and swelling present                             Internal Pelvic Floor tight and tender layer 1  Patient confirms identification and approves PT to assess internal pelvic floor and treatment Yes  PELVIC MMT:   MMT eval  Vaginal 4/5  Internal Anal Sphincter   External Anal Sphincter   Puborectalis   Diastasis Recti   (Blank rows = not tested)        TONE: Healed lower abdominal scars High tone pelvic floor- gripping, does not lengthen with diaphragmatic breathing  Large older vertical abdominal scar Upper hest breathing  PROLAPSE: no  TODAY'S TREATMENT:                                                                                                                              DATE: 04/17/23    There ex- prone stretch on elbows (  cobra) Bridges STS Single leg STS  Neuro reed- diaphragmatic breathing Yoga ball circles Thomas stretch Single  knee to chest stretch  HOME EXERCISE PROGRAM: [id number]  ASSESSMENT:  CLINICAL IMPRESSION: Pt still cathing. Has progressed some since her surgery.  Did well with her stretches. Tightness bilateral hip flexors.   OBJECTIVE IMPAIRMENTS: decreased coordination, decreased ROM, increased edema, increased fascial restrictions, increased muscle spasms, impaired flexibility, and pain.   ACTIVITY LIMITATIONS: continence and toileting  PARTICIPATION LIMITATIONS: community activity  PERSONAL FACTORS: Age and Time since onset of injury/illness/exacerbation are also affecting patient's functional outcome.   REHAB POTENTIAL: Good  CLINICAL DECISION MAKING: Stable/uncomplicated  EVALUATION COMPLEXITY: Low   GOALS: Goals reviewed with patient? Yes  SHORT TERM GOALS: Target date: 03/19/2023    Pt will be I with her initial HEP Baseline: Goal status: met 2.  Pt will report reducing need to cath by 50% Baseline:  Goal status: progressing  3.  Pt will be I with manual pelvic floor release Baseline:  Goal status: progressing    LONG TERM GOALS: Target date: 05/14/2023    Pt will be I with her advanced HEP Baseline:  Goal status: INITIAL  2.  Pt will have 0/10 pain in her pelvic floor muscles Baseline:  Goal status: INITIAL  3.  Pt will be able to urinate as needed Baseline:  Goal status: INITIAL  4.  Pt will not leak with coughing, sneezing and laughing Baseline:  Goal status: INITIAL  5.  Pt will report 0 urinary urgency Baseline:  Goal status: INITIAL    PLAN:  PT FREQUENCY: 1-2x/week  PT DURATION: 12 weeks  PLANNED INTERVENTIONS: 97110-Therapeutic exercises, 97530- Therapeutic activity, 97112- Neuromuscular re-education, 97535- Self Care, 29562- Manual therapy, Dry Needling, Joint mobilization, Joint manipulation, Spinal manipulation, Spinal  mobilization, Scar mobilization, and Biofeedback  PLAN FOR NEXT SESSION: cont down training   Even Budlong, PT 04/17/23 1:08 PM

## 2023-04-18 DIAGNOSIS — R339 Retention of urine, unspecified: Secondary | ICD-10-CM | POA: Diagnosis not present

## 2023-04-23 DIAGNOSIS — N3020 Other chronic cystitis without hematuria: Secondary | ICD-10-CM | POA: Diagnosis not present

## 2023-04-23 DIAGNOSIS — R338 Other retention of urine: Secondary | ICD-10-CM | POA: Diagnosis not present

## 2023-04-30 DIAGNOSIS — E559 Vitamin D deficiency, unspecified: Secondary | ICD-10-CM | POA: Diagnosis not present

## 2023-04-30 DIAGNOSIS — K760 Fatty (change of) liver, not elsewhere classified: Secondary | ICD-10-CM | POA: Diagnosis not present

## 2023-04-30 DIAGNOSIS — Z780 Asymptomatic menopausal state: Secondary | ICD-10-CM | POA: Diagnosis not present

## 2023-04-30 DIAGNOSIS — I493 Ventricular premature depolarization: Secondary | ICD-10-CM | POA: Diagnosis not present

## 2023-04-30 DIAGNOSIS — E7800 Pure hypercholesterolemia, unspecified: Secondary | ICD-10-CM | POA: Diagnosis not present

## 2023-05-02 DIAGNOSIS — L570 Actinic keratosis: Secondary | ICD-10-CM | POA: Diagnosis not present

## 2023-05-02 DIAGNOSIS — D485 Neoplasm of uncertain behavior of skin: Secondary | ICD-10-CM | POA: Diagnosis not present

## 2023-05-02 DIAGNOSIS — D2372 Other benign neoplasm of skin of left lower limb, including hip: Secondary | ICD-10-CM | POA: Diagnosis not present

## 2023-05-02 DIAGNOSIS — L578 Other skin changes due to chronic exposure to nonionizing radiation: Secondary | ICD-10-CM | POA: Diagnosis not present

## 2023-05-02 DIAGNOSIS — D225 Melanocytic nevi of trunk: Secondary | ICD-10-CM | POA: Diagnosis not present

## 2023-05-02 DIAGNOSIS — L821 Other seborrheic keratosis: Secondary | ICD-10-CM | POA: Diagnosis not present

## 2023-05-03 ENCOUNTER — Ambulatory Visit: Payer: Medicare Other | Admitting: Physical Therapy

## 2023-05-05 DIAGNOSIS — R339 Retention of urine, unspecified: Secondary | ICD-10-CM | POA: Diagnosis not present

## 2023-05-08 ENCOUNTER — Ambulatory Visit: Payer: Medicare Other | Admitting: Hematology and Oncology

## 2023-05-12 DIAGNOSIS — R339 Retention of urine, unspecified: Secondary | ICD-10-CM | POA: Diagnosis not present

## 2023-05-22 DIAGNOSIS — H9201 Otalgia, right ear: Secondary | ICD-10-CM | POA: Diagnosis not present

## 2023-05-22 DIAGNOSIS — R0981 Nasal congestion: Secondary | ICD-10-CM | POA: Diagnosis not present

## 2023-05-22 DIAGNOSIS — Z6834 Body mass index (BMI) 34.0-34.9, adult: Secondary | ICD-10-CM | POA: Diagnosis not present

## 2023-05-30 ENCOUNTER — Encounter: Payer: Self-pay | Admitting: Hematology and Oncology

## 2023-06-05 ENCOUNTER — Ambulatory Visit: Payer: Medicare Other | Admitting: Hematology and Oncology

## 2023-06-05 ENCOUNTER — Ambulatory Visit
Admission: RE | Admit: 2023-06-05 | Discharge: 2023-06-05 | Disposition: A | Discharge status: Home / Self Care | Payer: Medicare Other | Source: Ambulatory Visit | Attending: Hematology and Oncology | Admitting: Hematology and Oncology

## 2023-06-05 ENCOUNTER — Telehealth: Payer: Self-pay | Admitting: Hematology and Oncology

## 2023-06-05 DIAGNOSIS — Z853 Personal history of malignant neoplasm of breast: Secondary | ICD-10-CM | POA: Diagnosis not present

## 2023-06-05 DIAGNOSIS — Z1239 Encounter for other screening for malignant neoplasm of breast: Secondary | ICD-10-CM | POA: Diagnosis not present

## 2023-06-05 DIAGNOSIS — Z1501 Genetic susceptibility to malignant neoplasm of breast: Secondary | ICD-10-CM

## 2023-06-05 MED ORDER — GADOPICLENOL 0.5 MMOL/ML IV SOLN
9.0000 mL | Freq: Once | INTRAVENOUS | Status: AC | PRN
  Administered 2023-06-05: 9 mL via INTRAVENOUS

## 2023-06-05 NOTE — Telephone Encounter (Signed)
 I informed the patient that the breast MRI is normal

## 2023-06-06 DIAGNOSIS — T8389XA Other specified complication of genitourinary prosthetic devices, implants and grafts, initial encounter: Secondary | ICD-10-CM | POA: Diagnosis not present

## 2023-06-06 DIAGNOSIS — R339 Retention of urine, unspecified: Secondary | ICD-10-CM | POA: Diagnosis not present

## 2023-06-11 ENCOUNTER — Encounter (INDEPENDENT_AMBULATORY_CARE_PROVIDER_SITE_OTHER): Payer: Self-pay | Admitting: Otolaryngology

## 2023-06-19 ENCOUNTER — Ambulatory Visit (INDEPENDENT_AMBULATORY_CARE_PROVIDER_SITE_OTHER): Admitting: Physician Assistant

## 2023-06-19 ENCOUNTER — Encounter (INDEPENDENT_AMBULATORY_CARE_PROVIDER_SITE_OTHER): Payer: Self-pay

## 2023-06-19 ENCOUNTER — Ambulatory Visit (INDEPENDENT_AMBULATORY_CARE_PROVIDER_SITE_OTHER): Admitting: Audiology

## 2023-06-19 VITALS — BP 139/76 | HR 62 | Ht 62.5 in | Wt 176.0 lb

## 2023-06-19 DIAGNOSIS — J302 Other seasonal allergic rhinitis: Secondary | ICD-10-CM | POA: Diagnosis not present

## 2023-06-19 DIAGNOSIS — H903 Sensorineural hearing loss, bilateral: Secondary | ICD-10-CM | POA: Diagnosis not present

## 2023-06-19 DIAGNOSIS — H6690 Otitis media, unspecified, unspecified ear: Secondary | ICD-10-CM

## 2023-06-19 MED ORDER — FLUTICASONE PROPIONATE 50 MCG/ACT NA SUSP: 2.0000 | Freq: Every day | NASAL | 6 refills | Status: AC

## 2023-06-19 MED ORDER — AZELASTINE HCL 0.1 % NA SOLN: 2.0000 | Freq: Two times a day (BID) | NASAL | 12 refills | Status: AC

## 2023-06-19 NOTE — Progress Notes (Signed)
  [address] [place], [place] [address] [phone]  Audiological Evaluation    Name: Kara Ramos     DOB:   [birth year]      MRN:   [id number]                                                                                     Service Date: 06/19/2023     Accompanied by: unaccompanied    Patient comes today after Lorane Rocker, PA-C sent a referral for a hearing evaluation due to concerns with ear pain.   Symptoms Yes Details  Hearing loss  []    Tinnitus  []    Ear pain/ infections/pressure  [x]  Had right ear pain - one month ago  Balance problems  []    Noise exposure history  []    Previous ear surgeries  []    Family history of hearing loss  []    Amplification  []    Other  [x]  Multiple ear infections as a child    Otoscopy: Right ear: Clear external ear canals and notable landmarks visualized on the tympanic membrane. Left ear:  Non-occluding cerumen, able to visualize notable tympanic membrane landmarks  Tympanometry: Right ear: Type A- Normal external ear canal volume with normal middle ear pressure and tympanic membrane compliance. (Very broad gradient noted ) Left ear: Type A- Normal external ear canal volume with normal middle ear pressure and tympanic membrane compliance.    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  Speech Audiometry: Right ear- Speech Reception Threshold (SRT) was obtained at 20 dBHL. Left ear-Speech Reception Threshold (SRT) was obtained at 20 dBHL.   Word Recognition Score Tested using NU-6 (MLV) Right ear: 96% was obtained at a presentation level of 60 dBHL with contralateral masking which is deemed as  excellent. Left ear: 96% was obtained at a presentation level of 60 dBHL with contralateral masking which is  deemed as  excellent.   The hearing test results were completed under headphones and results are deemed to be of good to fair reliability. Test technique:  conventional     Recommendations: Follow up with ENT as scheduled for today. Return for a hearing evaluation if concerns with hearing changes arise or per MD recommendation.   Kara Ramos, AUD

## 2023-06-19 NOTE — Progress Notes (Signed)
 Dear Dr. Clotilde Danker, Here is my assessment for our mutual patient, Kara Ramos. Thank you for allowing me the opportunity to care for your patient. Please do not hesitate to contact me should you have any other questions. Sincerely, Belma Boxer PA-C  Otolaryngology Clinic Note Referring provider: Dr. Clotilde Danker HPI:  Kara Ramos is a 73 y.o. female kindly referred by Dr. Clotilde Danker   The patient is a 73 year old female seen in our office for evaluation of hearing loss.  The patient notes that approximately 1 month ago she had an episode of acute otitis media in her right ear.  She notes no significant hearing deficits but may be some small difficulties with hearing prior to that.  That while she had the right otitis media the hearing in the right was worse, this has returned back to its baseline at this point.  She denies any preceding hearing evaluation.  She denies any head or neck trauma, no head or neck surgery no loud noise exposure.  She denies any pain today or infectious signs or symptoms.  No dizziness or ringing.  She does note a history of recurrent episodes of sinusitis, she takes Zyrtec and Claritin  interchangeably as well as Flonase, nasal saline and Atrovent.  She notes no significant sinus drainage today.  Her symptoms are worse in the spring.  She notes she has seen an allergist prior and did shots as a teenager. She reports some clicking and popping as well as some bilateral ear plane with airplane travel    Independent Review of Additional Tests or Records:  Audiological evaluation on 06/19/2023   Otoscopy: Right ear: Clear external ear canals and notable landmarks visualized on the tympanic membrane. Left ear:  Non-occluding cerumen, able to visualize notable tympanic membrane landmarks   Tympanometry: Right ear: Type A- Normal external ear canal volume with normal middle ear pressure and tympanic membrane compliance. (Very broad gradient noted ) Left ear: Type A- Normal  external ear canal volume with normal middle ear pressure and tympanic membrane compliance.     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   Speech Audiometry: Right ear- Speech Reception Threshold (SRT) was obtained at 20 dBHL. Left ear-Speech Reception Threshold (SRT) was obtained at 20 dBHL.   Word Recognition Score Tested using NU-6 (MLV) Right ear: 96% was obtained at a presentation level of 60 dBHL with contralateral masking which is deemed as  excellent. Left ear: 96% was obtained at a presentation level of 60 dBHL with contralateral masking which is deemed as  excellent.   The hearing test results were completed under headphones and results are deemed to be of good to fair reliability. PMH/Meds/All/SocHx/FamHx/ROS:     Past Surgical History:  Procedure Laterality Date   ABDOMINAL HYSTERECTOMY     both ovaries removed   APPENDECTOMY     BREAST LUMPECTOMY     BREAST LUMPECTOMY WITH RADIOACTIVE SEED LOCALIZATION Right 12/24/2018   Procedure: RIGHT BREAST LUMPECTOMY WITH RADIOACTIVE SEED LOCALIZATION;  Surgeon: Lockie Rima, MD;  Location: [facility];  Service: General;  Laterality: Right;   BUNIONECTOMY     both feet   CATARACT EXTRACTION     right   COLON SURGERY  1988   part of colon removed d/t endometriosis   COLONOSCOPY  05/2017  One benign polyp   CORRECTION HAMMER TOE  03/2017   CYSTOSCOPY N/A 01/05/2023   Procedure: CYSTOSCOPY;  Surgeon: Andrez Banker, MD;  Location: [facility];  Service: Urology;  Laterality: N/A;   PUBOVAGINAL SLING N/A 01/05/2023   Procedure: MID-URETHRAL SLING;  Surgeon: Andrez Banker, MD;  Location: [facility];  Service: Urology;  Laterality: N/A;  60 MINUTES    Family History  Problem Relation Age of Onset   Heart disease  Mother    Brain cancer Father    Heart disease Father    Heart disease Sister    Lung cancer Brother    Colon cancer Maternal Grandmother    Cancer Paternal Grandmother    Esophageal cancer Neg Hx    Stomach cancer Neg Hx    Rectal cancer Neg Hx    Colon polyps Neg Hx      Social Connections: Not on file      Current Outpatient Medications:    Calcium Carb-Cholecalciferol (CALCIUM 500 + D PO), Take 1 tablet by mouth daily., Disp: , Rfl:    calcium elemental as carbonate (TUMS ULTRA 1000) 400 MG chewable tablet, Chew 2,000 mg by mouth daily as needed for heartburn., Disp: , Rfl:    Cholecalciferol (VITAMIN D3 ULTRA STRENGTH) 125 MCG (5000 UT) capsule, Take 5,000 Units by mouth daily., Disp: , Rfl:    COMIRNATY syringe, , Disp: , Rfl:    CRANBERRY PO, Take 30,000 mg by mouth 2 (two) times daily., Disp: , Rfl:    desonide (DESOWEN) 0.05 % cream, Apply 1 Application topically 2 (two) times daily as needed (dandruff)., Disp: , Rfl:    fluticasone (FLONASE) 50 MCG/ACT nasal spray, Place 1 spray into both nostrils daily as needed for allergies or rhinitis., Disp: , Rfl:    hydrocortisone  (ANUSOL -HC) 2.5 % rectal cream, Place 1 application. rectally at bedtime as needed for hemorrhoids or anal itching., Disp: 30 g, Rfl: 2   loratadine  (CLARITIN ) 10 MG tablet, Take 10 mg by mouth daily., Disp: , Rfl:    Methylcellulose, Laxative, (CITRUCEL PO), Take 1 Scoop by mouth daily., Disp: , Rfl:    metoprolol succinate (TOPROL-XL) 25 MG 24 hr tablet, TAKE 1 TABLET BY MOUTH EVERY DAY, Disp: 60 tablet, Rfl: 3   omeprazole (PRILOSEC) 20 MG capsule, Take 1 capsule by mouth daily before breakfast., Disp: , Rfl:    PREMARIN vaginal cream, Place 1 applicator vaginally daily as needed (irritation/dryness)., Disp: , Rfl:    tamoxifen  (NOLVADEX ) 20 MG tablet, TAKE 1 TABLET(20 MG) BY MOUTH DAILY, Disp: 90 tablet, Rfl: 3   tamsulosin (FLOMAX) 0.4 MG CAPS capsule, Take 0.4 mg by mouth daily., Disp: , Rfl:     acetaminophen  (TYLENOL ) 500 MG tablet, Take 500 mg by mouth every 6 (six) hours as needed for moderate pain (pain score 4-6). (Patient not taking: Reported on 04/11/2023), Disp: , Rfl:    albuterol  (VENTOLIN  HFA) 108 (90 Base) MCG/ACT inhaler, Inhale 2 puffs into the lungs every 4 (four) hours as needed for wheezing or shortness of breath. (Patient not taking: Reported on 06/19/2023), Disp: 6.7 g, Rfl: 5   Ascorbic Acid (VITAMIN C) 500 MG CAPS, Take 500 mg by mouth daily. (Patient not taking: Reported on 06/19/2023), Disp: , Rfl:    Physical Exam:   BP 139/76 (BP Location: Right Arm, Patient Position: Sitting, Cuff Size: Normal)   Pulse 62   Ht 5' 2.5" (1.588 m)   Wt 176 lb (79.8 kg)   SpO2 96%  BMI 31.68 kg/m   Pertinent Findings  CN II-XII intact Bilateral EAC clear  (minimal wax left ear canal ) and TM intact with well pneumatized middle ear spaces Weber 512: equal, LF TM with  Rinne 512: AC > BC b/l  Anterior rhinoscopy: Septum midline; bilateral inferior turbinates with minimal hypertrophy  No lesions of oral cavity/oropharynx; dentition wnl No obviously palpable neck masses/lymphadenopathy/thyromegaly No respiratory distress or stridor Hemangioma left scalp above ear  Seprately Identifiable Procedures:  None  Impression & Plans:  Kara Ramos is a 73 y.o. female with the following   Otitis media-   Her episode of otitis media has resolved as well as her perceived hearing loss that was likely secondary to middle ear infection. She does have some minor high frequency hearing loss that does not cause significant problems. Follow up as needed  Allergic rhinitis-  Fairly well controlled. I have added Astelin  in addition to her daily antihistamine and Flonase. I would like to see her back in three months for repeat evaluation. I will consider referring to allergist if she continues to have issues. She has had injection in the past.    - f/u 3 months     Thank you for  allowing me the opportunity to care for your patient. Please do not hesitate to contact me should you have any other questions.  Sincerely, Belma Boxer PA-C [facility] Phone: [phone] Fax: [phone]  06/19/2023, 9:50 AM

## 2023-06-20 DIAGNOSIS — N3946 Mixed incontinence: Secondary | ICD-10-CM | POA: Diagnosis not present

## 2023-06-20 DIAGNOSIS — R338 Other retention of urine: Secondary | ICD-10-CM | POA: Diagnosis not present

## 2023-06-27 ENCOUNTER — Encounter: Payer: Self-pay | Admitting: Audiology

## 2023-08-14 DIAGNOSIS — K080 Exfoliation of teeth due to systemic causes: Secondary | ICD-10-CM | POA: Diagnosis not present

## 2023-10-08 ENCOUNTER — Encounter (INDEPENDENT_AMBULATORY_CARE_PROVIDER_SITE_OTHER): Payer: Self-pay | Admitting: Physician Assistant

## 2023-10-08 ENCOUNTER — Ambulatory Visit (INDEPENDENT_AMBULATORY_CARE_PROVIDER_SITE_OTHER): Admitting: Physician Assistant

## 2023-10-08 VITALS — BP 121/77 | HR 66

## 2023-10-08 DIAGNOSIS — J302 Other seasonal allergic rhinitis: Secondary | ICD-10-CM

## 2023-10-08 NOTE — Progress Notes (Signed)
 Dear Dr. Katina, Here is my assessment for our mutual patient, Kara Ramos. Thank you for allowing me the opportunity to care for your patient. Please do not hesitate to contact me should you have any other questions. Sincerely, Chyrl Cohen PA-C  Otolaryngology Clinic Note Referring provider: Dr. Katina HPI:  Kara Ramos is a 73 y.o. female kindly referred by Dr. Katina   The patient patient is a 73 year old female seen in our office for follow-up evaluation of allergic rhinitis.  She was last seen in the office on 06/19/2023 below is a recap of encounter.   The patient is a 73 year old female seen in our office for evaluation of hearing loss.  The patient notes that approximately 1 month ago she had an episode of acute otitis media in her right ear.  She notes no significant hearing deficits but may be some small difficulties with hearing prior to that.  That while she had the right otitis media the hearing in the right was worse, this has returned back to its baseline at this point.  She denies any preceding hearing evaluation.  She denies any head or neck trauma, no head or neck surgery no loud noise exposure.  She denies any pain today or infectious signs or symptoms.  No dizziness or ringing.  She does note a history of recurrent episodes of sinusitis, she takes Zyrtec and Claritin  interchangeably as well as Flonase , nasal saline and Atrovent.  She notes no significant sinus drainage today.  Her symptoms are worse in the spring.  She notes she has seen an allergist prior and did shots as a teenager. She reports some clicking and popping as well as some bilateral ear plane with airplane travel   Update 8/2/52025  Since her last office visit she notes she has been doing well, she notes she has fairly well-controlled allergic rhinitis.  She continues to use Astelin , Flonase , and Zyrtec.  She notes her symptoms are more prominent in the morning and more recently have been more bothersome  than usual.  She has not seen an allergist in many years and would like referral.  No infectious signs or symptoms.  She feels like her hearing is normal with no recurrence of infections.   Independent Review of Additional Tests or Records:  none   PMH/Meds/All/SocHx/FamHx/ROS:   Past Medical History:  Diagnosis Date   Allergy     Arthritis    Asthma    Breast CA (HCC)    Breast cancer (HCC)    Cancer (HCC) 1978   cervical- had hysterectomy   Cataract    Removed both eyes   Complication of anesthesia    per patient 'one time was aware and awake during surgery and also BP can get really low   GERD (gastroesophageal reflux disease)    History of IBS    Irregular heart rhythm    occasional PAC'S, PVC's   Osteopenia    PAC (premature atrial contraction)    hx of   Personal history of radiation therapy      Past Surgical History:  Procedure Laterality Date   ABDOMINAL HYSTERECTOMY     both ovaries removed   APPENDECTOMY     BREAST LUMPECTOMY     BREAST LUMPECTOMY WITH RADIOACTIVE SEED LOCALIZATION Right 12/24/2018   Procedure: RIGHT BREAST LUMPECTOMY WITH RADIOACTIVE SEED LOCALIZATION;  Surgeon: Aron Shoulders, MD;  Location: [facility] OR;  Service: General;  Laterality: Right;   BUNIONECTOMY     both feet   CATARACT EXTRACTION  right   COLON SURGERY  1988   part of colon removed d/t endometriosis   COLONOSCOPY  05/2017   One benign polyp   CORRECTION HAMMER TOE  03/2017   CYSTOSCOPY N/A 01/05/2023   Procedure: CYSTOSCOPY;  Surgeon: Cam Morene ORN, MD;  Location: [facility];  Service: Urology;  Laterality: N/A;   PUBOVAGINAL SLING N/A 01/05/2023   Procedure: MID-URETHRAL SLING;  Surgeon: Cam Morene ORN, MD;  Location: [facility];  Service: Urology;  Laterality: N/A;  60 MINUTES    Family History  Problem Relation Age of Onset   Heart disease Mother    Brain cancer Father    Heart disease Father    Heart disease Sister    Lung cancer Brother    Colon cancer  Maternal Grandmother    Cancer Paternal Grandmother    Esophageal cancer Neg Hx    Stomach cancer Neg Hx    Rectal cancer Neg Hx    Colon polyps Neg Hx      Social Connections: Not on file      Current Outpatient Medications:    acetaminophen  (TYLENOL ) 500 MG tablet, Take 500 mg by mouth every 6 (six) hours as needed for moderate pain (pain score 4-6). (Patient not taking: Reported on 04/11/2023), Disp: , Rfl:    albuterol  (VENTOLIN  HFA) 108 (90 Base) MCG/ACT inhaler, Inhale 2 puffs into the lungs every 4 (four) hours as needed for wheezing or shortness of breath. (Patient not taking: Reported on 06/19/2023), Disp: 6.7 g, Rfl: 5   Ascorbic Acid (VITAMIN C) 500 MG CAPS, Take 500 mg by mouth daily. (Patient not taking: Reported on 06/19/2023), Disp: , Rfl:    azelastine  (ASTELIN ) 0.1 % nasal spray, Place 2 sprays into both nostrils 2 (two) times daily. Use in each nostril as directed, Disp: 30 mL, Rfl: 12   Calcium Carb-Cholecalciferol (CALCIUM 500 + D PO), Take 1 tablet by mouth daily., Disp: , Rfl:    calcium elemental as carbonate (TUMS ULTRA 1000) 400 MG chewable tablet, Chew 2,000 mg by mouth daily as needed for heartburn., Disp: , Rfl:    Cholecalciferol (VITAMIN D3 ULTRA STRENGTH) 125 MCG (5000 UT) capsule, Take 5,000 Units by mouth daily., Disp: , Rfl:    COMIRNATY syringe, , Disp: , Rfl:    CRANBERRY PO, Take 30,000 mg by mouth 2 (two) times daily., Disp: , Rfl:    desonide (DESOWEN) 0.05 % cream, Apply 1 Application topically 2 (two) times daily as needed (dandruff)., Disp: , Rfl:    fluticasone  (FLONASE ) 50 MCG/ACT nasal spray, Place 1 spray into both nostrils daily as needed for allergies or rhinitis., Disp: , Rfl:    fluticasone  (FLONASE ) 50 MCG/ACT nasal spray, Place 2 sprays into both nostrils daily., Disp: 16 g, Rfl: 6   hydrocortisone  (ANUSOL -HC) 2.5 % rectal cream, Place 1 application. rectally at bedtime as needed for hemorrhoids or anal itching., Disp: 30 g, Rfl: 2   loratadine   (CLARITIN ) 10 MG tablet, Take 10 mg by mouth daily., Disp: , Rfl:    Methylcellulose, Laxative, (CITRUCEL PO), Take 1 Scoop by mouth daily., Disp: , Rfl:    metoprolol succinate (TOPROL-XL) 25 MG 24 hr tablet, TAKE 1 TABLET BY MOUTH EVERY DAY, Disp: 60 tablet, Rfl: 3   omeprazole (PRILOSEC) 20 MG capsule, Take 1 capsule by mouth daily before breakfast., Disp: , Rfl:    PREMARIN vaginal cream, Place 1 applicator vaginally daily as needed (irritation/dryness)., Disp: , Rfl:    tamoxifen  (NOLVADEX ) 20 MG  tablet, TAKE 1 TABLET(20 MG) BY MOUTH DAILY, Disp: 90 tablet, Rfl: 3   tamsulosin (FLOMAX) 0.4 MG CAPS capsule, Take 0.4 mg by mouth daily., Disp: , Rfl:    Physical Exam:   BP 121/77   Pulse 66   SpO2 96%   Pertinent Findings  CN II-XII intact Bilateral EAC clear and TM intact with well pneumatized middle ear spaces Anterior rhinoscopy: Septum midline; bilateral inferior turbinates with minimal hypertrophy  No lesions of oral cavity/oropharynx; dentition WNL No obviously palpable neck masses/lymphadenopathy/thyromegaly No respiratory distress or stridor  Seprately Identifiable Procedures:  None  Impression & Plans:  Mairyn Lenahan is a 73 y.o. female with the following   Allergic rhinitis-  Since her last office visit she notes she has been doing well, she notes she has fairly well-controlled allergic rhinitis.  She continues to use Astelin , Flonase , and Zyrtec.  She notes her symptoms are more prominent in the morning and more recently have been more bothersome than usual.  She has not seen an allergist in many years and would like referral.  I advised her to return to our office if she finds that she is not having controlled symptoms to discuss further management.  Otherwise she may follow-up on a as needed basis.   - f/u PRN   Thank you for allowing me the opportunity to care for your patient. Please do not hesitate to contact me should you have any other  questions.  Sincerely, Chyrl Cohen PA-C [facility] Phone: [phone] Fax: [phone]  10/08/2023, 9:17 AM

## 2023-10-11 DIAGNOSIS — L259 Unspecified contact dermatitis, unspecified cause: Secondary | ICD-10-CM | POA: Diagnosis not present

## 2023-10-16 ENCOUNTER — Other Ambulatory Visit: Payer: Self-pay | Admitting: Hematology and Oncology

## 2023-10-16 DIAGNOSIS — Z1231 Encounter for screening mammogram for malignant neoplasm of breast: Secondary | ICD-10-CM

## 2023-10-17 ENCOUNTER — Encounter: Payer: Self-pay | Admitting: Allergy

## 2023-10-17 ENCOUNTER — Ambulatory Visit: Admitting: Allergy

## 2023-10-17 ENCOUNTER — Other Ambulatory Visit: Payer: Self-pay

## 2023-10-17 VITALS — BP 122/72 | HR 59 | Temp 97.3°F | Resp 14 | Ht 61.75 in | Wt 184.1 lb

## 2023-10-17 DIAGNOSIS — Z888 Allergy status to other drugs, medicaments and biological substances status: Secondary | ICD-10-CM

## 2023-10-17 DIAGNOSIS — S40862A Insect bite (nonvenomous) of left upper arm, initial encounter: Secondary | ICD-10-CM

## 2023-10-17 DIAGNOSIS — T781XXD Other adverse food reactions, not elsewhere classified, subsequent encounter: Secondary | ICD-10-CM | POA: Diagnosis not present

## 2023-10-17 DIAGNOSIS — Z889 Allergy status to unspecified drugs, medicaments and biological substances status: Secondary | ICD-10-CM

## 2023-10-17 DIAGNOSIS — J4520 Mild intermittent asthma, uncomplicated: Secondary | ICD-10-CM | POA: Diagnosis not present

## 2023-10-17 DIAGNOSIS — J3089 Other allergic rhinitis: Secondary | ICD-10-CM

## 2023-10-17 MED ORDER — MONTELUKAST SODIUM 10 MG PO TABS: 10.0000 mg | ORAL_TABLET | Freq: Every day | ORAL | 5 refills | Status: DC

## 2023-10-17 NOTE — Progress Notes (Signed)
 Follow Up Note  RE: Kara Ramos MRN: [id number] DOB: 08-Apr-1950 Date of Office Visit: 10/17/2023  Referring provider: Katina Pfeiffer, PA-C Primary care provider: Katina Pfeiffer, PA-C  Chief Complaint: Follow-up (Allergy - seasonal. Now she is on Prednisone because she is having some rash. Enrique- she is doing good)  History of Present Illness: I had the pleasure of seeing Kara Ramos for a follow up visit at the [facility] on 10/17/2023. She is a 73 y.o. female, who is being followed for asthma, allergic rhinitis adverse food reaction. Her previous allergy  office visit was on 08/01/2021 with Dr. Luke. Today is a new complaint visit of worsening allergies.  Discussed the use of AI scribe software for clinical note transcription with the patient, who gave verbal consent to proceed.    She experiences worsening seasonal allergies characterized by increased nasal congestion, particularly in the morning, but also in the afternoon and evening. She alternates between Claritin  and Zyrtec, and recently started using Allegra. She uses Flonase  in the morning and azelastine  in the early afternoon, with two sprays per nostril. These treatments provide some relief but do not fully control her symptoms. No epistaxis, though she occasionally notices blood.  She has developed new drug allergies, including reactions to doxycycline and sulfa products, which cause rashes. She also experiences contact dermatitis, particularly when working in greenery, and had whole body rash after a suspected spider bite in the garden last month. She is currently on prednisone for this reaction.  Her asthma is generally well-controlled, only flaring up with upper respiratory infections. She uses an albuterol  rescue inhaler as needed, which is effective. She has not required emergency care or additional prednisone for asthma recently.  She has a history of stomach issues, which have improved over  the past couple of years. She avoids wheat and onions to prevent stomach upset.  In the spring, she had a severe ear infection that resulted in some hearing loss.     Patient used to be on AIT in the past with good benefit.   Assessment and Plan: Kara Ramos is a 73 y.o. female with: Other allergic rhinitis Past history - Perennial rhinitis symptoms since teenage years.  Last skin testing was over 50 years ago which showed multiple positives per patient report. Used to be on allergy  immunotherapy as a teenager.   Interim history - worsening symptoms. Ready to retest and if needed be start AIT. Return for allergy  skin testing (1-55). Will make additional recommendations based on results. If significant positives will recommend allergy  injections - handout given. Use Flonase  (fluticasone ) nasal spray 1-2 sprays per nostril once a day as needed for nasal congestion.  Use azelastine  nasal spray 1-2 sprays per nostril twice a day as needed for runny nose/drainage. Hold for 3 days before skin testing.  Nasal saline spray (i.e., Simply Saline) or nasal saline lavage (i.e., NeilMed) is recommended as needed and prior to medicated nasal sprays. Use over the counter antihistamines such as Zyrtec (cetirizine), Claritin  (loratadine ), Allegra (fexofenadine), or Xyzal (levocetirizine) daily as needed. May take twice a day during allergy  flares. May switch antihistamines every few months. Hold for 3 days before skin testing. Start Singulair  (montelukast ) 10mg  daily at night. Cautioned that in some children/adults can experience behavioral changes including hyperactivity, agitation, depression, sleep disturbances and suicidal ideations. These side effects are rare, but if you notice them you should notify me and discontinue Singulair  (montelukast ).  Mild intermittent asthma without complication Past history - Used to  have asthma as a teenager now only flares with URIs. 2020 spirometry was normal. Interim  history - usually flares with respiratory infections. Normal spirometry today. May use albuterol  rescue inhaler 2 puffs every 4 to 6 hours as needed for shortness of breath, chest tightness, coughing, and wheezing. May use albuterol  rescue inhaler 2 puffs 5 to 15 minutes prior to strenuous physical activities. Monitor frequency of use - if you need to use it more than twice per week on a consistent basis let us  know.   Other adverse food reactions, not elsewhere classified, subsequent encounter Past history - Diagnosed with IBS and noticing worsening abdominal pains and diarrhea the last few years. Skin testing as a teenager was positive to milk, corn, wheat, grains, coffee, grapefruit, shrimp per patient report. H/o distal sigmoid resection. Follows with GI. 2020 skin testing borderline positive to wheat, shellfish mix, trout, oat, rye, onion and pineapple. Tolerates shellfish. Follows with GI. Interim history - GI symptoms doing better. Continue to limit wheat and onions for now.  Insect bite of left upper arm, initial encounter Resolved reaction with rash from suspected insect bite. On prednisone. Family history of severe reactions to stinging insects. Complete current course of prednisone. Provided handout on insect bite management. Consider blood work for stinging insect allergies.   Multiple drug allergies Continue to avoid drugs on your allergy  list.   Return in about 2 weeks (around 10/31/2023) for Skin testing.  Meds ordered this encounter  Medications   montelukast  (SINGULAIR ) 10 MG tablet    Sig: Take 1 tablet (10 mg total) by mouth at bedtime.    Dispense:  30 tablet    Refill:  5   Lab Orders  No laboratory test(s) ordered today    Diagnostics: Spirometry:  Tracings reviewed. Her effort: Good reproducible efforts. FVC: 2.44L FEV1: 1.79L, 89% predicted FEV1/FVC ratio: 73% Interpretation: Spirometry consistent with normal pattern.  Please see scanned spirometry  results for details.  Results discussed with patient/family.   Medication List:  Current Outpatient Medications  Medication Sig Dispense Refill   albuterol  (VENTOLIN  HFA) 108 (90 Base) MCG/ACT inhaler Inhale 2 puffs into the lungs every 4 (four) hours as needed for wheezing or shortness of breath. 6.7 g 5   Ascorbic Acid (VITAMIN C) 500 MG CAPS Take 500 mg by mouth daily.     azelastine  (ASTELIN ) 0.1 % nasal spray Place 2 sprays into both nostrils 2 (two) times daily. Use in each nostril as directed 30 mL 12   Calcium Carb-Cholecalciferol (CALCIUM 500 + D PO) Take 1 tablet by mouth daily.     calcium elemental as carbonate (TUMS ULTRA 1000) 400 MG chewable tablet Chew 2,000 mg by mouth daily as needed for heartburn.     Cholecalciferol (VITAMIN D3 ULTRA STRENGTH) 125 MCG (5000 UT) capsule Take 5,000 Units by mouth daily.     CRANBERRY PO Take 30,000 mg by mouth 2 (two) times daily.     desonide (DESOWEN) 0.05 % cream Apply 1 Application topically 2 (two) times daily as needed (dandruff).     fluticasone  (FLONASE ) 50 MCG/ACT nasal spray Place 1 spray into both nostrils daily as needed for allergies or rhinitis.     fluticasone  (FLONASE ) 50 MCG/ACT nasal spray Place 2 sprays into both nostrils daily. 16 g 6   hydrocortisone  (ANUSOL -HC) 2.5 % rectal cream Place 1 application. rectally at bedtime as needed for hemorrhoids or anal itching. 30 g 2   Methylcellulose, Laxative, (CITRUCEL PO) Take 1 Scoop by mouth  daily.     metoprolol succinate (TOPROL-XL) 25 MG 24 hr tablet TAKE 1 TABLET BY MOUTH EVERY DAY 60 tablet 3   montelukast  (SINGULAIR ) 10 MG tablet Take 1 tablet (10 mg total) by mouth at bedtime. 30 tablet 5   omeprazole (PRILOSEC) 20 MG capsule Take 1 capsule by mouth daily before breakfast.     predniSONE (DELTASONE) 20 MG tablet Take by mouth.     PREMARIN vaginal cream Place 1 applicator vaginally daily as needed (irritation/dryness).     tamoxifen  (NOLVADEX ) 20 MG tablet TAKE 1  TABLET(20 MG) BY MOUTH DAILY 90 tablet 3   triamcinolone  cream (KENALOG ) 0.1 % 1 Application.     acetaminophen  (TYLENOL ) 500 MG tablet Take 500 mg by mouth every 6 (six) hours as needed for moderate pain (pain score 4-6). (Patient not taking: Reported on 10/17/2023)     No current facility-administered medications for this visit.   Allergies: Allergies  Allergen Reactions   Azithromycin Rash and Other (See Comments)    Pt stated, Lips turn numb and itching    Sudafed [Pseudoephedrine] Palpitations   Molnupiravir Hives, Itching and Swelling   Bactrim [Sulfamethoxazole-Trimethoprim] Itching and Rash   Doxycycline Hives and Rash   Erythromycin Nausea And Vomiting and Rash   Tape     Caused redness and peeling   Tessalon [Benzonatate] Itching and Rash   I reviewed her past medical history, social history, family history, and environmental history and no significant changes have been reported from her previous visit.  Review of Systems  Constitutional:  Negative for appetite change, chills, fever and unexpected weight change.  HENT:  Positive for congestion, rhinorrhea and sneezing.   Eyes:  Negative for itching.  Respiratory:  Negative for cough, chest tightness, shortness of breath and wheezing.   Cardiovascular:  Negative for chest pain.  Gastrointestinal:  Negative for abdominal pain.  Genitourinary:  Negative for difficulty urinating.  Skin:  Positive for rash.  Allergic/Immunologic: Positive for environmental allergies.  Neurological:  Negative for headaches.    Objective: BP 122/72 (BP Location: Right Arm, Patient Position: Sitting, Cuff Size: Normal)   Pulse (!) 59   Temp (!) 97.3 F (36.3 C) (Temporal)   Resp 14   Ht 5' 1.75 (1.568 m)   Wt 184 lb 1.6 oz (83.5 kg)   SpO2 96%   BMI 33.95 kg/m  Body mass index is 33.95 kg/m. Physical Exam Vitals and nursing note reviewed.  Constitutional:      Appearance: Normal appearance. She is well-developed.  HENT:      Head: Normocephalic and atraumatic.     Right Ear: Tympanic membrane and external ear normal.     Left Ear: Tympanic membrane and external ear normal.     Nose: Nose normal.     Mouth/Throat:     Mouth: Mucous membranes are moist.     Pharynx: Oropharynx is clear.  Eyes:     Conjunctiva/sclera: Conjunctivae normal.  Cardiovascular:     Rate and Rhythm: Normal rate and regular rhythm.     Heart sounds: Normal heart sounds. No murmur heard.    No friction rub. No gallop.  Pulmonary:     Effort: Pulmonary effort is normal.     Breath sounds: Normal breath sounds. No wheezing, rhonchi or rales.  Musculoskeletal:     Cervical back: Neck supple.  Skin:    General: Skin is warm.     Findings: Rash present.     Comments: On left arm  Neurological:  Mental Status: She is alert and oriented to person, place, and time.  Psychiatric:        Behavior: Behavior normal.    Previous notes and tests were reviewed. The plan was reviewed with the patient/family, and all questions/concerned were addressed.  It was my pleasure to see Kara Ramos today and participate in her care. Please feel free to contact me with any questions or concerns.  Sincerely,  Orlan Cramp, DO Allergy  & Immunology  [facility]  [place] office: [phone] [facility] office: [phone]

## 2023-10-17 NOTE — Patient Instructions (Addendum)
 Rhinitis  Return for allergy  skin testing. Will make additional recommendations based on results. If significant positives will recommend allergy  injections - handout given. Make sure you don't take any antihistamines for 3 days before the skin testing appointment. Don't put any lotion on the back and arms on the day of testing.  Must be in good health and not ill. No vaccines/injections/antibiotics within the past 7 days.  Plan on being here for 30-60 minutes.  Use Flonase  (fluticasone ) nasal spray 1-2 sprays per nostril once a day as needed for nasal congestion.  Use azelastine  nasal spray 1-2 sprays per nostril twice a day as needed for runny nose/drainage. Hold for 3 days before skin testing.  Nasal saline spray (i.e., Simply Saline) or nasal saline lavage (i.e., NeilMed) is recommended as needed and prior to medicated nasal sprays.  Use over the counter antihistamines such as Zyrtec (cetirizine), Claritin  (loratadine ), Allegra (fexofenadine), or Xyzal (levocetirizine) daily as needed. May take twice a day during allergy  flares. May switch antihistamines every few months. Hold for 3 days before skin testing.  Start Singulair  (montelukast ) 10mg  daily at night. Cautioned that in some children/adults can experience behavioral changes including hyperactivity, agitation, depression, sleep disturbances and suicidal ideations. These side effects are rare, but if you notice them you should notify me and discontinue Singulair  (montelukast ).  Asthma May use albuterol  rescue inhaler 2 puffs every 4 to 6 hours as needed for shortness of breath, chest tightness, coughing, and wheezing. May use albuterol  rescue inhaler 2 puffs 5 to 15 minutes prior to strenuous physical activities. Monitor frequency of use - if you need to use it more than twice per week on a consistent basis let us  know.   Foods Continue to limit wheat and onions for now.  Multiple drug allergies Continue to avoid drugs on your  allergy  list.   Insect bites Continue to avoid. See handout. Consider getting bloodwork for stinging insect panel.   Follow up for skin testing.

## 2023-10-18 DIAGNOSIS — Z0000 Encounter for general adult medical examination without abnormal findings: Secondary | ICD-10-CM | POA: Diagnosis not present

## 2023-10-18 DIAGNOSIS — Z23 Encounter for immunization: Secondary | ICD-10-CM | POA: Diagnosis not present

## 2023-10-18 DIAGNOSIS — Z1331 Encounter for screening for depression: Secondary | ICD-10-CM | POA: Diagnosis not present

## 2023-10-30 NOTE — Progress Notes (Unsigned)
 Skin testing note  RE: Kara Ramos MRN: [id number] DOB: 20-May-1950 Date of Office Visit: 10/31/2023  Referring provider: Katina Pfeiffer, PA-C Primary care provider: Katina Pfeiffer, PA-C  Chief Complaint: skin testing  History of Present Illness: I had the pleasure of seeing Kara Ramos for a skin testing visit [facility]  on 10/31/2023. She is a 73 y.o. female, who is being followed for allergic rhinitis, asthma, allergic reaction. Her previous allergy  office visit was on 10/17/2023 with Dr. Luke. Today is a skin testing visit.   Discussed the use of AI scribe software for clinical note transcription with the patient, who gave verbal consent to proceed.    She has tested positive for multiple allergens, including pollen, grass, ragweed, weed pollen, several trees, mold, and cat allergens. Her symptoms, particularly morning allergies, worsened when she was off antihistamines for three days prior to testing.  She has started taking singulair  which seems to helps.   She lives with one dog and five cats, which may contribute to her symptoms. She attempts to mitigate this by wiping her dog's feet to reduce pollen exposure.   Assessment and Plan: Kara Ramos is a 73 y.o. 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  Last skin testing was over 50 years ago which showed multiple positives per patient report. Used to be on allergy  immunotherapy as a teenager.  5 cats, 1 dog at home.  Interim history - singulair  helps. Today's skin testing positive to grass, ragweed, weed, trees, mold, cat. Start environmental control measures as below. Use over the counter antihistamines such as Zyrtec (cetirizine), Claritin  (loratadine ), Allegra (fexofenadine), or Xyzal (levocetirizine) daily as needed. May  take twice a day during allergy  flares. May switch antihistamines every few months. Use Flonase  (fluticasone ) nasal spray 1-2 sprays per nostril once a day as needed for nasal congestion.  Use azelastine  nasal spray 1-2 sprays per nostril twice a day as needed for runny nose/drainage. Nasal saline spray (i.e., Simply Saline) or nasal saline lavage (i.e., NeilMed) is recommended as needed and prior to medicated nasal sprays. Continue Singulair  (montelukast ) 10mg  daily at night. Recommend allergy  injections. 2 injections Let us  know when ready to start.  Had a detailed discussion with patient/family that clinical history is suggestive of allergic rhinitis, and may benefit from allergy  immunotherapy (AIT). Discussed in detail regarding the dosing, schedule, side effects (mild to moderate local allergic reaction and rarely systemic allergic reactions including anaphylaxis), and benefits (significant improvement in nasal symptoms, seasonal flares of asthma) of immunotherapy with the patient. There is significant time commitment involved with allergy  shots, which includes weekly immunotherapy injections for first 9-12 months and then biweekly to monthly injections for 3-5 years.    Mild intermittent asthma without complication Past history - Used to have asthma as a teenager now only flares with URIs. 2020 spirometry was normal. May use albuterol  rescue inhaler 2 puffs every 4 to 6 hours as needed for shortness of breath, chest tightness, coughing, and wheezing. May use albuterol  rescue inhaler 2 puffs 5 to 15 minutes prior to strenuous physical activities. Monitor frequency of use - if you need to use it more than twice per week on a consistent basis let us  know.    Other adverse food reactions, not elsewhere classified, subsequent encounter Past history - Diagnosed with IBS and noticing worsening abdominal  pains and diarrhea the last few years. Skin testing as a teenager was positive to milk, corn,  wheat, grains, coffee, grapefruit, shrimp per patient report. H/o distal sigmoid resection. Follows with GI. 2020 skin testing borderline positive to wheat, shellfish mix, trout, oat, rye, onion and pineapple. Tolerates shellfish. Follows with GI. Continue to limit wheat and onions for now.   Multiple drug allergies Continue to avoid drugs on your allergy  list.   Return in about 6 months (around 04/29/2024).  No orders of the defined types were placed in this encounter.  Lab Orders  No laboratory test(s) ordered today    Diagnostics: Skin Testing: Environmental allergy  panel. Today's skin testing positive to grass, ragweed, weed, trees, mold, cat. Results discussed with patient/family.  Airborne Adult Perc - 10/31/23 0948     Time Antigen Placed 0948    Allergen Manufacturer Jestine    Location Back    Number of Test 55    1. Control-Buffer 50% Glycerol Negative    2. Control-Histamine 3+    3. Bahia 2+    4. [place] 2+    5. Johnson 2+    6. [place]  Blue Negative    7. Meadow Fescue Negative    8. Perennial Rye 2+    9. Timothy Negative    10. Ragweed Mix 3+    11. Cocklebur --   +/-   12. Plantain,  English Negative    13. Baccharis Negative    14. Dog Fennel 2+    15. Russian Thistle Negative    16. Lamb's Quarters Negative    17. Sheep Sorrell --   +/-   18. Rough Pigweed 2+    19. Marsh Elder, Rough Negative    20. Mugwort, Common Negative    21. Box, Elder Negative    22. Cedar, red 2+    23. Sweet Gum 2+    24. Pecan Pollen Negative    25. Pine Mix Negative    26. Walnut, Black Pollen 2+    27. Red Mulberry Negative    28. Ash Mix Negative    29. Birch Mix 2+    30. Beech American 2+    31. Cottonwood, Guinea[place] Negative    32. Hickory, White Negative    33. Maple Mix Negative    34. Oak, [place] Mix 2+    35. Sycamore Eastern 2+    36. Alternaria Alternata Negative    37. Cladosporium Herbarum Negative    38. Aspergillus Mix 2+    39. Penicillium Mix  2+    40. Bipolaris Sorokiniana (Helminthosporium) Negative    41. Drechslera Spicifera (Curvularia) 2+    42. Mucor Plumbeus Negative    43. Fusarium Moniliforme 2+    44. Aureobasidium Pullulans (pullulara) 2+    45. Rhizopus Oryzae 2+    46. Botrytis Cinera 2+    47. Epicoccum Nigrum 2+    49. Dust Mite Mix Negative    50. Cat Hair 10,000 BAU/ml 2+    51.  Dog Epithelia Negative    52. Mixed Feathers Negative    53. Horse Epithelia Negative    54. Cockroach, German Negative    55. Tobacco Leaf Negative          Intradermal - 10/31/23 1029     Time Antigen Placed 1029    Allergen Manufacturer Jestine    Location Arm    Number of Test 5    Control Negative    Mold 1 Negative  Mite Mix Negative    Dog Negative    Cockroach Negative          Previous notes and tests were reviewed. The plan was reviewed with the patient/family, and all questions/concerned were addressed.  It was my pleasure to see Kara Ramos today and participate in her care. Please feel free to contact me with any questions or concerns.  Sincerely,  Orlan Cramp, DO Allergy  & Immunology  [facility] [place]  [place] office: [phone] [facility] office: [phone]

## 2023-10-31 ENCOUNTER — Ambulatory Visit (INDEPENDENT_AMBULATORY_CARE_PROVIDER_SITE_OTHER): Admitting: Allergy

## 2023-10-31 ENCOUNTER — Encounter: Payer: Self-pay | Admitting: Allergy

## 2023-10-31 DIAGNOSIS — J3081 Allergic rhinitis due to animal (cat) (dog) hair and dander: Secondary | ICD-10-CM

## 2023-10-31 DIAGNOSIS — J3089 Other allergic rhinitis: Secondary | ICD-10-CM | POA: Diagnosis not present

## 2023-10-31 DIAGNOSIS — T781XXD Other adverse food reactions, not elsewhere classified, subsequent encounter: Secondary | ICD-10-CM

## 2023-10-31 DIAGNOSIS — J4520 Mild intermittent asthma, uncomplicated: Secondary | ICD-10-CM

## 2023-10-31 DIAGNOSIS — J301 Allergic rhinitis due to pollen: Secondary | ICD-10-CM

## 2023-10-31 DIAGNOSIS — Z889 Allergy status to unspecified drugs, medicaments and biological substances status: Secondary | ICD-10-CM

## 2023-10-31 NOTE — Patient Instructions (Addendum)
 Today's skin testing positive to grass, ragweed, weed, trees, mold, cat.  Results given.  Environmental allergies Start environmental control measures as below. Use over the counter antihistamines such as Zyrtec (cetirizine), Claritin  (loratadine ), Allegra (fexofenadine), or Xyzal (levocetirizine) daily as needed. May take twice a day during allergy  flares. May switch antihistamines every few months. Use Flonase  (fluticasone ) nasal spray 1-2 sprays per nostril once a day as needed for nasal congestion.  Use azelastine  nasal spray 1-2 sprays per nostril twice a day as needed for runny nose/drainage. Nasal saline spray (i.e., Simply Saline) or nasal saline lavage (i.e., NeilMed) is recommended as needed and prior to medicated nasal sprays. Continue Singulair  (montelukast ) 10mg  daily at night.  Recommend allergy  injections. 2 injections Let us  know when ready to start.  Had a detailed discussion with patient/family that clinical history is suggestive of allergic rhinitis, and may benefit from allergy  immunotherapy (AIT). Discussed in detail regarding the dosing, schedule, side effects (mild to moderate local allergic reaction and rarely systemic allergic reactions including anaphylaxis), and benefits (significant improvement in nasal symptoms, seasonal flares of asthma) of immunotherapy with the patient. There is significant time commitment involved with allergy  shots, which includes weekly immunotherapy injections for first 9-12 months and then biweekly to monthly injections for 3-5 years.   Asthma May use albuterol  rescue inhaler 2 puffs every 4 to 6 hours as needed for shortness of breath, chest tightness, coughing, and wheezing. May use albuterol  rescue inhaler 2 puffs 5 to 15 minutes prior to strenuous physical activities. Monitor frequency of use - if you need to use it more than twice per week on a consistent basis let us  know.   Foods Continue to limit wheat and onions for now.  Multiple  drug allergies Continue to avoid drugs on your allergy  list.   Insect bites Continue to avoid. Consider getting bloodwork for stinging insect panel.   Return in about 6 months (around 04/29/2024). Or sooner if needed.   Reducing Pollen Exposure Pollen seasons: trees (spring), grass (summer) and ragweed/weeds (fall). Keep windows closed in your home and car to lower pollen exposure.  Install air conditioning in the bedroom and throughout the house if possible.  Avoid going out in dry windy days - especially early morning. Pollen counts are highest between 5 - 10 AM and on dry, hot and windy days.  Save outside activities for late afternoon or after a heavy rain, when pollen levels are lower.  Avoid mowing of grass if you have grass pollen allergy . Be aware that pollen can also be transported indoors on people and pets.  Dry your clothes in an automatic dryer rather than hanging them outside where they might collect pollen.  Rinse hair and eyes before bedtime.  Mold Control Mold and fungi can grow on a variety of surfaces provided certain temperature and moisture conditions exist.  Outdoor molds grow on plants, decaying vegetation and soil. The major outdoor mold, Alternaria and Cladosporium, are found in very high numbers during hot and dry conditions. Generally, a late summer - fall peak is seen for common outdoor fungal spores. Rain will temporarily lower outdoor mold spore count, but counts rise rapidly when the rainy period ends. The most important indoor molds are Aspergillus and Penicillium. Dark, humid and poorly ventilated basements are ideal sites for mold growth. The next most common sites of mold growth are the bathroom and the kitchen. Outdoor (Seasonal) Mold Control Use air conditioning and keep windows closed. Avoid exposure to decaying vegetation. Avoid leaf  raking. Avoid grain handling. Consider wearing a face mask if working in moldy areas.  Indoor (Perennial) Mold  Control  Maintain humidity below 50%. Get rid of mold growth on hard surfaces with water , detergent and, if necessary, 5% bleach (do not mix with other cleaners). Then dry the area completely. If mold covers an area more than 10 square feet, consider hiring an indoor environmental professional. For clothing, washing with soap and water  is best. If moldy items cannot be cleaned and dried, throw them away. Remove sources e.g. contaminated carpets. Repair and seal leaking roofs or pipes. Using dehumidifiers in damp basements may be helpful, but empty the water  and clean units regularly to prevent mildew from forming. All rooms, especially basements, bathrooms and kitchens, require ventilation and cleaning to deter mold and mildew growth. Avoid carpeting on concrete or damp floors, and storing items in damp areas.  Pet Allergen Avoidance: Contrary to popular opinion, there are no "hypoallergenic" breeds of dogs or cats. That is because people are not allergic to an animal's hair, but to an allergen found in the animal's saliva, dander (dead skin flakes) or urine. Pet allergy  symptoms typically occur within minutes. For some people, symptoms can build up and become most severe 8 to 12 hours after contact with the animal. People with severe allergies can experience reactions in public places if dander has been transported on the pet owners' clothing. Keeping an animal outdoors is only a partial solution, since homes with pets in the yard still have higher concentrations of animal allergens. Before getting a pet, ask your allergist to determine if you are allergic to animals. If your pet is already considered part of your family, try to minimize contact and keep the pet out of the bedroom and other rooms where you spend a great deal of time. As with dust mites, vacuum carpets often or replace carpet with a hardwood floor, tile or linoleum. High-efficiency particulate air (HEPA) cleaners can reduce allergen  levels over time. While dander and saliva are the source of cat and dog allergens, urine is the source of allergens from rabbits, hamsters, mice and [place] pigs; so ask a non-allergic family member to clean the animal's cage. If you have a pet allergy , talk to your allergist about the potential for allergy  immunotherapy (allergy  shots). This strategy can often provide long-term relief.

## 2023-11-01 DIAGNOSIS — D1809 Hemangioma of other sites: Secondary | ICD-10-CM | POA: Diagnosis not present

## 2023-11-01 DIAGNOSIS — L821 Other seborrheic keratosis: Secondary | ICD-10-CM | POA: Diagnosis not present

## 2023-11-01 DIAGNOSIS — L578 Other skin changes due to chronic exposure to nonionizing radiation: Secondary | ICD-10-CM | POA: Diagnosis not present

## 2023-11-01 DIAGNOSIS — D2372 Other benign neoplasm of skin of left lower limb, including hip: Secondary | ICD-10-CM | POA: Diagnosis not present

## 2023-11-01 DIAGNOSIS — D485 Neoplasm of uncertain behavior of skin: Secondary | ICD-10-CM | POA: Diagnosis not present

## 2023-11-01 DIAGNOSIS — D225 Melanocytic nevi of trunk: Secondary | ICD-10-CM | POA: Diagnosis not present

## 2023-11-13 DIAGNOSIS — D0511 Intraductal carcinoma in situ of right breast: Secondary | ICD-10-CM | POA: Diagnosis not present

## 2023-11-13 DIAGNOSIS — Z23 Encounter for immunization: Secondary | ICD-10-CM | POA: Diagnosis not present

## 2023-11-13 DIAGNOSIS — I10 Essential (primary) hypertension: Secondary | ICD-10-CM | POA: Diagnosis not present

## 2023-11-13 DIAGNOSIS — E7800 Pure hypercholesterolemia, unspecified: Secondary | ICD-10-CM | POA: Diagnosis not present

## 2023-11-13 DIAGNOSIS — K219 Gastro-esophageal reflux disease without esophagitis: Secondary | ICD-10-CM | POA: Diagnosis not present

## 2023-12-03 ENCOUNTER — Ambulatory Visit
Admission: RE | Admit: 2023-12-03 | Discharge: 2023-12-03 | Disposition: A | Source: Ambulatory Visit | Attending: Hematology and Oncology

## 2023-12-03 DIAGNOSIS — Z1231 Encounter for screening mammogram for malignant neoplasm of breast: Secondary | ICD-10-CM | POA: Diagnosis not present

## 2023-12-25 DIAGNOSIS — C50919 Malignant neoplasm of unspecified site of unspecified female breast: Secondary | ICD-10-CM | POA: Diagnosis not present

## 2024-01-07 DIAGNOSIS — N393 Stress incontinence (female) (male): Secondary | ICD-10-CM | POA: Diagnosis not present

## 2024-01-23 ENCOUNTER — Other Ambulatory Visit: Payer: Self-pay

## 2024-01-23 ENCOUNTER — Other Ambulatory Visit: Payer: Self-pay | Admitting: Hematology and Oncology

## 2024-01-23 MED ORDER — TAMOXIFEN CITRATE 20 MG PO TABS: 20.0000 mg | ORAL_TABLET | Freq: Every day | ORAL | 3 refills | Status: AC

## 2024-01-28 DIAGNOSIS — H353111 Nonexudative age-related macular degeneration, right eye, early dry stage: Secondary | ICD-10-CM | POA: Diagnosis not present

## 2024-02-19 ENCOUNTER — Ambulatory Visit
Admission: RE | Admit: 2024-02-19 | Discharge: 2024-02-19 | Disposition: A | Source: Ambulatory Visit | Attending: Emergency Medicine | Admitting: Emergency Medicine

## 2024-02-19 DIAGNOSIS — R911 Solitary pulmonary nodule: Secondary | ICD-10-CM

## 2024-02-28 ENCOUNTER — Encounter: Payer: Self-pay | Admitting: Emergency Medicine

## 2024-02-28 ENCOUNTER — Ambulatory Visit: Admitting: Emergency Medicine

## 2024-02-28 VITALS — BP 115/82 | HR 67 | Ht 62.0 in | Wt 179.2 lb

## 2024-02-28 DIAGNOSIS — Z87891 Personal history of nicotine dependence: Secondary | ICD-10-CM

## 2024-02-28 DIAGNOSIS — J4520 Mild intermittent asthma, uncomplicated: Secondary | ICD-10-CM

## 2024-02-28 DIAGNOSIS — R918 Other nonspecific abnormal finding of lung field: Secondary | ICD-10-CM

## 2024-02-28 NOTE — Patient Instructions (Signed)
 We reviewed your CT scan of the chest today.  This shows stable pulmonary nodules compared with your priors.  Good news. Will plan to repeat your CT in January 2027. Keep your albuterol  available to use 2 puffs when needed for shortness of breath, chest tightness, wheezing. Finish your Augmentin prescription as it was written Follow Dr. Shelah in January 2027.  Please call sooner if you have any problems.

## 2024-02-28 NOTE — Progress Notes (Signed)
"  Subjective:    Patient ID: Kara Ramos, female    DOB: 05/30/1950, 74 y.o.   MRN: [id number]  HPI  ROV 03/14/2023 --75 year old woman with a minimal tobacco history, history of breast and cervical cancer, latent TB (childhood), asthma, allergic rhinitis, IBS.  She also has a strong family history of lung cancer.  We have been following some small solid and subsolid 5 mm pulmonary nodules. She may be having a bit of exertional SOB. She rarely uses albuterol , often in setting URI. No flares since last time.   CT chest 02/20/2023 reviewed by me, showed --no mediastinal adenopathy, right middle lobe 4 mm pulmonary nodule, unchanged going back to 08/2020 and consistent with an area of mucous plugging or bronchial.  Several additional scattered stable 4-5 mm groundglass nodules   ROV 02/28/2024 --follow-up visit for 74 year old woman with a minimal tobacco history but a history of breast and cervical cancer, latent TB (childhood), asthma, allergic rhinitis, IBS.  We have been following pulmonary nodules on CT scan of the chest.  We have plan to follow her scans for 5 years total, initially noted nodules 08/2020. She has been dealing with cough and PND associated with URI, a few times this yr. She ios dealing w sinus drainage, cough. Uses albuterol  rarely - she is currently on Augmentin.   CT chest 02/19/2024 reviewed by me, shows overall stability of her scattered pulmonary nodules including 4 mm ground glass nodules, 4 mm right middle lobe nodule.   Review of Systems As per HPI     Objective:   Physical Exam Vitals:   02/28/24 1130  BP: 115/82  Pulse: 67  SpO2: 98%  Weight: 179 lb 3.2 oz (81.3 kg)  Height: 5' 2 (1.575 m)    Gen: Pleasant, well-nourished, in no distress,  normal affect  ENT: No lesions,  mouth clear,  oropharynx clear, no postnasal drip  Neck: No JVD, no stridor  Lungs: No use of accessory muscles, no crackles or wheezing on normal respiration, no wheeze on forced  expiration  Cardiovascular: RRR, heart sounds normal, no murmur or gallops, no peripheral edema  Musculoskeletal: No deformities, no cyanosis or clubbing  Neuro: alert, awake, non focal  Skin: Warm, no lesions or rash      Assessment & Plan:   Pulmonary nodules CT chest reviewed and shows scattered small pulmonary nodules that are stable compared with priors.  She needs repeat scan in 1 year  We reviewed your CT scan of the chest today.  This shows stable pulmonary nodules compared with your priors.  Good news. Will plan to repeat your CT in January 2027. Follow Dr. Shelah in January 2027.  Please call sooner if you have any problems.  Mild intermittent asthma without complication She had a recent URI that led to sinusitis and some flaring of her asthma symptoms.  She is improving, currently on Augmentin and I have asked her to complete this.  She has albuterol  to use if needed.  No wheezing on exam today.   I personally spent a total of 33 minutes in the care of the patient today including preparing to see the patient, getting/reviewing separately obtained history, performing a medically appropriate exam/evaluation, counseling and educating, placing orders, documenting clinical information in the EHR, independently interpreting results, and communicating results.    Lamar Shelah, MD, PhD 02/28/2024, 3:14 PM [facility] [phone] or if no answer before 7:00PM call [phone] For any issues after 7:00PM please call eLink  [phone] ° °"

## 2024-02-28 NOTE — Assessment & Plan Note (Signed)
 She had a recent URI that led to sinusitis and some flaring of her asthma symptoms.  She is improving, currently on Augmentin and I have asked her to complete this.  She has albuterol  to use if needed.  No wheezing on exam today.

## 2024-02-28 NOTE — Assessment & Plan Note (Signed)
 CT chest reviewed and shows scattered small pulmonary nodules that are stable compared with priors.  She needs repeat scan in 1 year  We reviewed your CT scan of the chest today.  This shows stable pulmonary nodules compared with your priors.  Good news. Will plan to repeat your CT in January 2027. Follow Dr. Shelah in January 2027.  Please call sooner if you have any problems.

## 2024-03-04 ENCOUNTER — Other Ambulatory Visit: Payer: Self-pay | Admitting: Allergy

## 2024-03-20 ENCOUNTER — Telehealth: Payer: Self-pay | Admitting: Hematology and Oncology

## 2024-03-20 NOTE — Telephone Encounter (Signed)
 I spoke with patient and she has been rescheduled from 04/10/2024 to 04/24/2024.

## 2024-04-10 ENCOUNTER — Ambulatory Visit: Payer: Medicare Other | Admitting: Hematology and Oncology

## 2024-04-24 ENCOUNTER — Inpatient Hospital Stay: Admitting: Hematology and Oncology

## 2024-04-30 ENCOUNTER — Ambulatory Visit: Admitting: Allergy
